# Patient Record
Sex: Female | Born: 1965 | Race: White | Hispanic: No | Smoking: Never smoker
Health system: Southern US, Community
[De-identification: ages and names within clinical notes are randomized; demographics above are authoritative.]

## PROBLEM LIST (undated history)

## (undated) DIAGNOSIS — T7840XA Allergy, unspecified, initial encounter: Secondary | ICD-10-CM

## (undated) DIAGNOSIS — K635 Polyp of colon: Secondary | ICD-10-CM

## (undated) DIAGNOSIS — R519 Headache, unspecified: Secondary | ICD-10-CM

## (undated) DIAGNOSIS — G8929 Other chronic pain: Secondary | ICD-10-CM

## (undated) DIAGNOSIS — N390 Urinary tract infection, site not specified: Secondary | ICD-10-CM

## (undated) DIAGNOSIS — K589 Irritable bowel syndrome without diarrhea: Secondary | ICD-10-CM

## (undated) DIAGNOSIS — R51 Headache: Secondary | ICD-10-CM

## (undated) DIAGNOSIS — M542 Cervicalgia: Secondary | ICD-10-CM

## (undated) HISTORY — DX: Allergy, unspecified, initial encounter: T78.40XA

## (undated) HISTORY — DX: Polyp of colon: K63.5

## (undated) HISTORY — DX: Urinary tract infection, site not specified: N39.0

## (undated) HISTORY — DX: Hereditary hemochromatosis: E83.110

## (undated) HISTORY — PX: LAPAROSCOPIC ENDOMETRIOSIS FULGURATION: SUR769

## (undated) HISTORY — PX: ABDOMINAL HYSTERECTOMY: SHX81

---

## 2003-03-25 ENCOUNTER — Encounter: Admission: RE | Admit: 2003-03-25 | Discharge: 2003-03-25 | Payer: Self-pay | Admitting: Family Medicine

## 2003-03-25 ENCOUNTER — Encounter: Payer: Self-pay | Admitting: Family Medicine

## 2006-10-19 ENCOUNTER — Ambulatory Visit: Payer: Self-pay | Admitting: Obstetrics and Gynecology

## 2007-09-03 ENCOUNTER — Ambulatory Visit: Payer: Self-pay | Admitting: Obstetrics and Gynecology

## 2007-09-09 ENCOUNTER — Ambulatory Visit: Payer: Self-pay | Admitting: Obstetrics and Gynecology

## 2008-12-04 DIAGNOSIS — K635 Polyp of colon: Secondary | ICD-10-CM

## 2008-12-04 HISTORY — DX: Polyp of colon: K63.5

## 2010-03-23 ENCOUNTER — Encounter: Payer: Self-pay | Admitting: Cardiology

## 2010-03-24 ENCOUNTER — Encounter: Admission: RE | Admit: 2010-03-24 | Discharge: 2010-03-24 | Payer: Self-pay | Admitting: Emergency Medicine

## 2010-04-05 ENCOUNTER — Encounter: Admission: RE | Admit: 2010-04-05 | Discharge: 2010-04-05 | Payer: Self-pay | Admitting: Emergency Medicine

## 2010-04-05 ENCOUNTER — Other Ambulatory Visit: Admission: RE | Admit: 2010-04-05 | Discharge: 2010-04-05 | Payer: Self-pay | Admitting: Radiology

## 2010-05-16 ENCOUNTER — Encounter: Payer: Self-pay | Admitting: Cardiology

## 2010-07-08 ENCOUNTER — Telehealth (INDEPENDENT_AMBULATORY_CARE_PROVIDER_SITE_OTHER): Payer: Self-pay | Admitting: *Deleted

## 2010-07-11 ENCOUNTER — Ambulatory Visit: Payer: Self-pay | Admitting: Cardiology

## 2010-07-11 DIAGNOSIS — R002 Palpitations: Secondary | ICD-10-CM | POA: Insufficient documentation

## 2010-07-11 DIAGNOSIS — R079 Chest pain, unspecified: Secondary | ICD-10-CM

## 2010-07-11 DIAGNOSIS — R0789 Other chest pain: Secondary | ICD-10-CM | POA: Insufficient documentation

## 2010-08-23 ENCOUNTER — Telehealth (INDEPENDENT_AMBULATORY_CARE_PROVIDER_SITE_OTHER): Payer: Self-pay | Admitting: *Deleted

## 2010-12-19 ENCOUNTER — Encounter
Admission: RE | Admit: 2010-12-19 | Discharge: 2010-12-19 | Payer: Self-pay | Source: Home / Self Care | Attending: Emergency Medicine | Admitting: Emergency Medicine

## 2011-01-03 NOTE — Letter (Signed)
Summary: [facility]  [facility]   Imported By: Marylou Mccoy 07/27/2010 12:20:10  _____________________________________________________________________  External Attachment:    Type:   Image     Comment:   External Document

## 2011-01-03 NOTE — Progress Notes (Signed)
Summary: 48 hour holter monitor Cx  Phone Note Outgoing Call Call back at [facility] Phone [phone]   Call placed by: Stanton Kidney, EMT-P,  August 23, 2010 3:01 PM Summary of Call: Left message for pt to call to reschedule for 48 hour holter monitor. 09/03/10 s/w Pt. advised she was not going to get the monitor and s/w Dr. Shirlee Latch about it already. Stanton Kidney, EMT-P  September 03, 2010 12:37 PM

## 2011-01-03 NOTE — Progress Notes (Signed)
  Recieved records from [place] office. Pt appt w/ Shirlee Latch 07/11/10 gave to Hosie Poisson Mesiemore  July 08, 2010 9:47 AM

## 2011-01-03 NOTE — Assessment & Plan Note (Signed)
Summary: NP6/MILD CHEST PAIN   Referring Provider:  Dr. Cleta Alberts Primary Provider:  Lesle Chris  CC:  new patient with mild chest pain.  Pt reports this has been intermittently for the last couple of years.  History of Present Illness: 45 yo with minimal past medical history presents for evaluation of chest pain and palpitations.  She has been having substernal sharp chest pain for the last 6 months.  It is sporadic and has no clear association with exercise or meals.  It usually occurs at work.  It can last for hours but tends to be mild in character.  She has been under a lot of stress at work over the last 6 months.  She works for [employer] and they have been undergoing the conversion to [employer].  She is able to climb 3 flights of steps to her office but is winded at the top.  She does not smoke.    Patient also reports occasional palpitations.  She will feel her heart race at times.  She tends to notice this when lying in bed at night.  This probably happens weekly.   ECG: NSR, normal  Labs (6/11): HCT 47.8, K 4.8, creatinine 0.5, LDL 81, HDL 60, TSH normal  Current Medications (verified): 1)  Tylenol 325 Mg Tabs (Acetaminophen) .... As Needed For Body Pain 2)  Excedrin Tension Headache 500-65 Mg Tabs (Acetaminophen-Caffeine) .... As Needed 3)  Multivitamins  Tabs (Multiple Vitamin) .... Once Daily  Allergies (verified): 1)  ! Pcn 2)  ! Prednisone 3)  ! * Valtrex  Past History:  Past Medical History: Endometriosis with partial hysterectomy  Family History: She was adopted.  Biological sister has history of PSVT.  Does not know her parents.  Social History: Environmental health practitioner at [employer].  Married, 1 adopted child.  No smoking.  No ETOH or drugs.   Review of Systems       All systems reviewed and negative except as per HPI.   Vital Signs:  Patient profile:   45 year old female Height:      61 inches Weight:      121 pounds BMI:     22.95 Pulse rate:   64 /  minute Pulse rhythm:   regular BP sitting:   108 / 72  (left arm) Cuff size:   regular  Vitals Entered By: Judithe Modest CMA (July 11, 2010 12:06 PM)  Physical Exam  General:  Well developed, well nourished, in no acute distress. Head:  normocephalic and atraumatic Nose:  no deformity, discharge, inflammation, or lesions Mouth:  Teeth, gums and palate normal. Oral mucosa normal. Neck:  Neck supple, no JVD. No masses, thyromegaly or abnormal cervical nodes. Lungs:  Clear bilaterally to auscultation and percussion. Heart:  Non-displaced PMI, chest non-tender; regular rate and rhythm, S1, S2 without murmurs, rubs or gallops. Carotid upstroke normal, no bruit. Pedals normal pulses. No edema, no varicosities. Abdomen:  Bowel sounds positive; abdomen soft and non-tender without masses, organomegaly, or hernias noted. No hepatosplenomegaly. Msk:  Back normal, normal gait. Muscle strength and tone normal. Extremities:  No clubbing or cyanosis. Neurologic:  Alert and oriented x 3. Skin:  Intact without lesions or rashes. Psych:  Normal affect.   Impression & Recommendations:  Problem # 1:  CHEST PAIN-UNSPECIFIED (ICD-786.50) Atypical chest pain.  Patient has no known risk factors for coronary disease.  She is very anxious that this could be heart disease.  I suspect that this may be a stress response  related to her work.  I will set her up for an ETT.  If that is normal, no further cardiac workup necessary.   Problem # 2:  PALPITATIONS (ICD-785.1) Occasional episodes where she feels her heart racing.  Her sister does have a history of SVT (no further details known).  If will have her get a 48 hour holter monitor to see if we catch an arrhythmia.   Other Orders: Holter (Holter) Treadmill (Treadmill)  Patient Instructions: 1)  Your physician has recommended that you wear a holter monitor.  Holter monitors are medical devices that record the heart's electrical activity. Doctors most often  use these monitors to diagnose arrhythmias. Arrhythmias are problems with the speed or rhythm of the heartbeat. The monitor is a small, portable device. You can wear one while you do your normal daily activities. This is usually used to diagnose what is causing palpitations/syncope (passing out). 48 Hour--HAVE THIS DONE BEFORE YOU HAVE THE TREADMILL 2)  Your physician has requested that you have an exercise tolerance test.  For further information please visit [email or web address]  Please also follow instruction sheet, as given.

## 2011-06-27 ENCOUNTER — Other Ambulatory Visit: Payer: Self-pay | Admitting: Emergency Medicine

## 2011-06-27 DIAGNOSIS — N6009 Solitary cyst of unspecified breast: Secondary | ICD-10-CM

## 2011-07-26 ENCOUNTER — Ambulatory Visit
Admission: RE | Admit: 2011-07-26 | Discharge: 2011-07-26 | Disposition: A | Discharge status: Home / Self Care | Payer: BC Managed Care – PPO | Source: Ambulatory Visit | Attending: Emergency Medicine | Admitting: Emergency Medicine

## 2011-07-26 DIAGNOSIS — N6009 Solitary cyst of unspecified breast: Secondary | ICD-10-CM

## 2011-11-20 ENCOUNTER — Ambulatory Visit: Payer: BC Managed Care – PPO

## 2011-11-20 NOTE — Progress Notes (Signed)
Patient seen for genetic counseling for hemochromatosis. 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 Will send summary letter to referring physician and Dr. Cleta Alberts per patient's request.

## 2011-12-22 ENCOUNTER — Telehealth: Payer: Self-pay | Admitting: Internal Medicine

## 2011-12-22 NOTE — Telephone Encounter (Signed)
alled pt with np appt for 1/23 at 1;00.

## 2011-12-25 ENCOUNTER — Telehealth: Payer: Self-pay | Admitting: Internal Medicine

## 2011-12-25 NOTE — Telephone Encounter (Signed)
Chart Del. °

## 2011-12-27 ENCOUNTER — Telehealth: Payer: Self-pay | Admitting: Internal Medicine

## 2011-12-27 ENCOUNTER — Ambulatory Visit (HOSPITAL_BASED_OUTPATIENT_CLINIC_OR_DEPARTMENT_OTHER): Payer: BC Managed Care – PPO | Admitting: Internal Medicine

## 2011-12-27 ENCOUNTER — Ambulatory Visit: Payer: BC Managed Care – PPO

## 2011-12-27 ENCOUNTER — Other Ambulatory Visit (HOSPITAL_BASED_OUTPATIENT_CLINIC_OR_DEPARTMENT_OTHER): Payer: BC Managed Care – PPO

## 2011-12-27 ENCOUNTER — Encounter: Payer: Self-pay | Admitting: Internal Medicine

## 2011-12-27 LAB — COMPREHENSIVE METABOLIC PANEL
AST: 20 U/L (ref 0–37)
Albumin: 4.6 g/dL (ref 3.5–5.2)
Alkaline Phosphatase: 43 U/L (ref 39–117)
Potassium: 4.1 mEq/L (ref 3.5–5.3)
Sodium: 141 mEq/L (ref 135–145)
Total Bilirubin: 0.4 mg/dL (ref 0.3–1.2)
Total Protein: 7.1 g/dL (ref 6.0–8.3)

## 2011-12-27 LAB — IRON AND TIBC
%SAT: 46 % (ref 20–55)
Iron: 126 ug/dL (ref 42–145)
TIBC: 273 ug/dL (ref 250–470)
UIBC: 147 ug/dL (ref 125–400)

## 2011-12-27 LAB — CBC WITH DIFFERENTIAL/PLATELET
Basophils Absolute: 0 10*3/uL (ref 0.0–0.1)
Eosinophils Absolute: 0.1 10*3/uL (ref 0.0–0.5)
HCT: 37.9 % (ref 34.8–46.6)
HGB: 12.9 g/dL (ref 11.6–15.9)
MCH: 31.8 pg (ref 25.1–34.0)
MONO#: 0.3 10*3/uL (ref 0.1–0.9)
NEUT#: 2.2 10*3/uL (ref 1.5–6.5)
NEUT%: 51.5 % (ref 38.4–76.8)
RDW: 12.1 % (ref 11.2–14.5)
WBC: 4.4 10*3/uL (ref 3.9–10.3)
lymph#: 1.7 10*3/uL (ref 0.9–3.3)

## 2011-12-27 NOTE — Progress Notes (Signed)
[facility] CONSULT NOTE  REASON FOR CONSULTATION:  46 years old white female diagnosed with  hereditary hemochromatosis.  HPI Ruth Harrison is a 46 y.o. female with no significant past medical history. Over the last few months, the patient has been complaining of increasing fatigue and weakness. She was initially sought to have iron deficiency anemia and the plan was to place her on iron supplement but the patient did not start her treatment because of lack of supporting lab. The patient continues to have the fatigue as well as the justification was consistent with irritable bowel disease including constipation alternating with diarrhea. She had iron study performed by her primary care physician and it showed elevated iron saturation suspicious for hemochromatosis. Unfortunately I don't have these results. 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 She was referred to me today for evaluation and recommendation regarding her condition. The patient is feeling okay today. She continues to have fatigue as well as migraine headache. She has generalized aching pain and the cough area right more than left as well as on the right hand now with the addition to the right arm. She is currently on that nabumetone for pain management by her primary care physician. The patient denied having any significant weight loss or night sweats, she has no history of diabetes mellitus, no cardiac disease, no neurologic abnormalities, she is adopted and family history is unknown. She is currently married with 3 stepsons and one adopted daughter. She has no history of smoking, alcohol or drug abuse and she does administrative work. She was seen by genetic counseling regarding her condition.  @SFHPI @  Past Medical History  Diagnosis Date  . Allergy   . Endometriosis     Past Surgical History  Procedure Date  . Abdominal hysterectomy partial      Family History  Problem Relation Age of Onset  . Adopted: Yes    Social History History  Substance Use Topics  . Smoking status: Former Smoker -- 1.0 packs/day for 2 years  . Smokeless tobacco: Never Used  . Alcohol Use: No    Allergies  Allergen Reactions  . Oxycodone Hives  . Penicillins Itching  . Prednisone Shortness Of Breath    Chest pain  . Valacyclovir Hcl Hives    Current Outpatient Prescriptions  Medication Sig Dispense Refill  . glucosamine-chondroitin 500-400 MG tablet Take 1 tablet by mouth daily.      . nabumetone (RELAFEN) 500 MG tablet Take 500 mg by mouth 2 (two) times daily.      Marland Kitchen aspirin-acetaminophen-caffeine (EXCEDRIN MIGRAINE) 250-250-65 MG per tablet Take 1 tablet by mouth every 6 (six) hours as needed.      . loratadine (CLARITIN) 10 MG tablet Take 10 mg by mouth daily.        Review of Systems  A comprehensive review of systems was negative except for: Constitutional: positive for fatigue Gastrointestinal: positive for constipation and diarrhea Musculoskeletal: positive for arthralgias and muscle weakness  Physical Exam  ZOX:WRUEA, healthy, no distress, well nourished and well developed SKIN: skin color, texture, turgor are normal HEAD: Normocephalic EYES: normal, PERRLA EARS: External ears normal, Canals clear OROPHARYNX:no exudate, no erythema and lips, buccal mucosa, and tongue normal  NECK: supple, no adenopathy LYMPH:  no palpable lymphadenopathy, no hepatosplenomegaly BREAST:not examined LUNGS: clear to auscultation  HEART: regular rate & rhythm, no murmurs and no gallops ABDOMEN:abdomen soft, non-tender, normal bowel sounds and no masses or organomegaly  BACK: Back symmetric, no curvature. EXTREMITIES:no joint deformities, effusion, or inflammation, no edema, no skin discoloration, no clubbing, no cyanosis  NEURO: alert & oriented x 3 with fluent speech, no focal motor/sensory deficits    Studies/Results: No results  found.   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 I have a lengthy discussion with the patient today about her condition and the goal of treatment which is mainly for prevention of complication of the hemochromatosis like liver cirrhosis, diabetes mellitus or cardiac abnormalities by reducing her iron overload.  PLAN: #1 I ordered several studies today to evaluate her condition including repeat CBC, comprehensive metabolic panel, iron study and ferritin. #2 I will arrange for the patient phlebotomy and the frequency of it will depend on the ferritin level with a goal to keep her ferritin of less than 50 mg/dl.  #3 advice the patient to avoid the iron rich foods and also any supplements containing iron.  #4 her lab results still pending, I would cause admission was result and will  arrange followup visit based on these results. I gave the patient the time to ask questions and I answered them completely to her satisfaction. The patient agreed to the current plan.   All questions were answered. The patient knows to call the clinic with any problems, questions or concerns. We can certainly see the patient much sooner if necessary.  Thank you so much for allowing me to participate in the care of Ruth Harrison. I will continue to follow up the patient with you and assist in her care.  I spent 25 minutes counseling the patient face to face. The total time spent in the appointment was 55 minutes.   Tayvion Lauder K. 12/27/2011, 5:10 PM

## 2011-12-27 NOTE — Telephone Encounter (Signed)
Dx- Hemochromatosis

## 2011-12-28 ENCOUNTER — Other Ambulatory Visit: Payer: Self-pay | Admitting: Internal Medicine

## 2011-12-28 ENCOUNTER — Telehealth: Payer: Self-pay | Admitting: Internal Medicine

## 2011-12-28 NOTE — Progress Notes (Signed)
Quick Note:  Call patient with the result with the ferritin results. She needs weekly phlebotomy for the next 3-4 weeks to keep ferritin level less than 50. I will see her back for F/U visit in 4 weeks with repeat Iron study and Ferritin. ______

## 2011-12-28 NOTE — Telephone Encounter (Signed)
Message copied by Charma Igo on Thu Dec 28, 2011  9:23 AM ------      Message from: [facility], [place]      Created: Thu Dec 28, 2011  8:23 AM       Call patient with the result with the ferritin results. She needs weekly phlebotomy for the next 3-4 weeks to keep ferritin level less than 50. I will see her back for F/U visit in 4 weeks with repeat Iron study and Ferritin.

## 2011-12-29 ENCOUNTER — Other Ambulatory Visit: Payer: Self-pay | Admitting: Internal Medicine

## 2011-12-29 ENCOUNTER — Telehealth: Payer: Self-pay | Admitting: Internal Medicine

## 2011-12-29 NOTE — Telephone Encounter (Signed)
called pt with appts for 2/6 2/13 2/20 and lab md and phleb on 2/27   aom

## 2012-01-03 ENCOUNTER — Other Ambulatory Visit: Payer: Self-pay | Admitting: *Deleted

## 2012-01-04 ENCOUNTER — Other Ambulatory Visit: Payer: Self-pay | Admitting: Family Medicine

## 2012-01-04 ENCOUNTER — Other Ambulatory Visit: Payer: Self-pay | Admitting: *Deleted

## 2012-01-04 NOTE — Telephone Encounter (Signed)
WANTS TO WHY REFILL FOR NABUMETONE WAS DENIED

## 2012-01-05 NOTE — Telephone Encounter (Signed)
Was erx'd on 01/02/12. Please check on this.  Alcide Memoli

## 2012-01-10 ENCOUNTER — Other Ambulatory Visit: Payer: Self-pay | Admitting: Internal Medicine

## 2012-01-10 ENCOUNTER — Ambulatory Visit (HOSPITAL_BASED_OUTPATIENT_CLINIC_OR_DEPARTMENT_OTHER): Payer: BC Managed Care – PPO

## 2012-01-10 ENCOUNTER — Other Ambulatory Visit: Payer: Self-pay | Admitting: *Deleted

## 2012-01-13 NOTE — Telephone Encounter (Signed)
It was not denied. It has already been refilled. pls verify that this has been done.  Ishani Goldwasser

## 2012-01-13 NOTE — Telephone Encounter (Signed)
Addended by: Sondra Barges on: 01/13/2012 06:57 PM   Modules accepted: Orders

## 2012-01-17 ENCOUNTER — Ambulatory Visit (HOSPITAL_BASED_OUTPATIENT_CLINIC_OR_DEPARTMENT_OTHER): Payer: BC Managed Care – PPO

## 2012-01-17 ENCOUNTER — Other Ambulatory Visit: Payer: Self-pay | Admitting: Internal Medicine

## 2012-01-17 NOTE — Progress Notes (Signed)
Phlebotomy done right anticubital space with 22g x 1" angiocath. obtained. Patient tolerated well. vss.

## 2012-01-24 ENCOUNTER — Ambulatory Visit (HOSPITAL_BASED_OUTPATIENT_CLINIC_OR_DEPARTMENT_OTHER): Payer: BC Managed Care – PPO

## 2012-01-24 NOTE — Progress Notes (Signed)
1535 to 1555;   Phlebotomy performed to Left AC using 20g cath attached to secondary tubing w/ extension tubing drained to empty 500cc IV bag.  Pt requested the smaller needle.  500 g blood drained w/o difficulty over 15 minutes.  Pt tolerated procedure well.  States felt a "little woozy" after the procedure.  Drank a sprite and ate tomato soup w/ saltines.  VSS after 30 minutes. Pt got up to use rest room and stated she felt fine and ok to drive home.  Instructed to drink plenty of fluids over next 24 hrs and call for any questions/concerns.  Pt verbalized understanding.

## 2012-01-25 ENCOUNTER — Other Ambulatory Visit: Payer: Self-pay | Admitting: *Deleted

## 2012-01-25 NOTE — Progress Notes (Signed)
Pt called stating that she is still feeling woozy and lightheaded the day after her most recent phlebotomy.  Per Dr Donnald Garre, continue to force fluids, will recheck lab work on 2/26 before f/u appt on 2/27.  Pt verbalized understanding.  SLJ

## 2012-01-29 ENCOUNTER — Ambulatory Visit (HOSPITAL_BASED_OUTPATIENT_CLINIC_OR_DEPARTMENT_OTHER): Payer: BC Managed Care – PPO | Admitting: Lab

## 2012-01-29 ENCOUNTER — Other Ambulatory Visit: Payer: Self-pay | Admitting: Internal Medicine

## 2012-01-29 ENCOUNTER — Telehealth: Payer: Self-pay | Admitting: Internal Medicine

## 2012-01-29 LAB — CBC WITH DIFFERENTIAL/PLATELET
BASO%: 0.2 % (ref 0.0–2.0)
EOS%: 1.3 % (ref 0.0–7.0)
HCT: 28.4 % — ABNORMAL LOW (ref 34.8–46.6)
HGB: 9.4 g/dL — ABNORMAL LOW (ref 11.6–15.9)
MCH: 32.4 pg (ref 25.1–34.0)
MCHC: 33.1 g/dL (ref 31.5–36.0)
MONO#: 0.3 10*3/uL (ref 0.1–0.9)
NEUT%: 60.2 % (ref 38.4–76.8)
RDW: 14.3 % (ref 11.2–14.5)
WBC: 4.5 10*3/uL (ref 3.9–10.3)
lymph#: 1.4 10*3/uL (ref 0.9–3.3)

## 2012-01-29 LAB — COMPREHENSIVE METABOLIC PANEL
ALT: 16 U/L (ref 0–35)
AST: 19 U/L (ref 0–37)
Albumin: 4 g/dL (ref 3.5–5.2)
Alkaline Phosphatase: 40 U/L (ref 39–117)
Calcium: 8.7 mg/dL (ref 8.4–10.5)
Chloride: 107 mEq/L (ref 96–112)
Potassium: 4.1 mEq/L (ref 3.5–5.3)
Sodium: 140 mEq/L (ref 135–145)
Total Protein: 6 g/dL (ref 6.0–8.3)

## 2012-01-29 LAB — IRON AND TIBC
%SAT: 44 % (ref 20–55)
TIBC: 252 ug/dL (ref 250–470)

## 2012-01-29 LAB — FERRITIN: Ferritin: 38 ng/mL (ref 10–291)

## 2012-01-29 NOTE — Telephone Encounter (Addendum)
Requests to come in today for lab instead of tomorrow due to scheduling conflict. I will send scheduling request

## 2012-01-30 ENCOUNTER — Other Ambulatory Visit: Payer: BC Managed Care – PPO | Admitting: Lab

## 2012-01-31 ENCOUNTER — Other Ambulatory Visit: Payer: BC Managed Care – PPO | Admitting: Lab

## 2012-01-31 ENCOUNTER — Ambulatory Visit (HOSPITAL_BASED_OUTPATIENT_CLINIC_OR_DEPARTMENT_OTHER): Payer: BC Managed Care – PPO | Admitting: Internal Medicine

## 2012-01-31 ENCOUNTER — Ambulatory Visit: Payer: BC Managed Care – PPO | Admitting: Internal Medicine

## 2012-01-31 NOTE — Progress Notes (Signed)
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  PRIOR THERAPY: Weekly phlebotomies x3, last one was on 01/24/2012  CURRENT THERAPY: None  INTERVAL HISTORY: Ruth Harrison 46 y.o. female returns to the clinic today for followup visit. The patient underwent a weekly phlebotomies x3. She has increasing fatigue and weakness secondary to the anemia from frequent phlebotomy. She starts feeling better in the last 2 days. She denied having any significant dizzy spells. She has no bleeding issues. The patient has repeat CBC, iron study and ferritin performed recently and she is here today for evaluation and discussion of her lab results.  MEDICAL HISTORY: Past Medical History  Diagnosis Date  . Allergy   . Endometriosis     ALLERGIES:  is allergic to oxycodone; penicillins; prednisone; and valacyclovir hcl.  MEDICATIONS:  Current Outpatient Prescriptions  Medication Sig Dispense Refill  . ibuprofen (ADVIL,MOTRIN) 200 MG tablet Take 200 mg by mouth 2 (two) times daily.      Marland Kitchen aspirin-acetaminophen-caffeine (EXCEDRIN MIGRAINE) 250-250-65 MG per tablet Take 1 tablet by mouth every 6 (six) hours as needed.      Marland Kitchen glucosamine-chondroitin 500-400 MG tablet Take 1 tablet by mouth daily.      Marland Kitchen loratadine (CLARITIN) 10 MG tablet Take 10 mg by mouth daily.      . nabumetone (RELAFEN) 500 MG tablet Take 500 mg by mouth 2 (two) times daily.        SURGICAL HISTORY:  Past Surgical History  Procedure Date  . Abdominal hysterectomy partial     REVIEW OF SYSTEMS:  A comprehensive review of systems was negative except for: Constitutional: positive for fatigue   PHYSICAL EXAMINATION: General appearance: alert, cooperative and no distress Neck: no adenopathy Resp: clear to auscultation bilaterally Cardio:  regular rate and rhythm, S1, S2 normal, no murmur, click, rub or gallop GI: soft, non-tender; bowel sounds normal; no masses,  no organomegaly Extremities: extremities normal, atraumatic, no cyanosis or edema  ECOG PERFORMANCE STATUS: 1 - Symptomatic but completely ambulatory  Blood pressure 100/66, pulse 72, temperature 98.1 F (36.7 C), temperature source Oral, height 5\' 1"  (1.549 m), weight 125 lb 1.6 oz (56.745 kg).  LABORATORY DATA: Lab Results  Component Value Date   WBC 4.5 01/29/2012   HGB 9.4* 01/29/2012   HCT 28.4* 01/29/2012   MCV 97.9 01/29/2012   PLT 216 01/29/2012      Chemistry      Component Value Date/Time   NA 140 01/29/2012 1359   K 4.1 01/29/2012 1359   CL 107 01/29/2012 1359   CO2 27 01/29/2012 1359   BUN 16 01/29/2012 1359   CREATININE 0.56 01/29/2012 1359      Component Value Date/Time   CALCIUM 8.7 01/29/2012 1359   ALKPHOS 40 01/29/2012 1359   AST 19 01/29/2012 1359   ALT 16 01/29/2012 1359   BILITOT 0.2* 01/29/2012 1359       RADIOGRAPHIC STUDIES: Ferritin 38, serum iron 110, total iron binding capacity 252, iron saturation 44%.  ASSESSMENT: This is a very pleasant 46 years old white female with hereditary hemochromatosis is status post phlebotomy x3 weekly. The patient has significant improvement in her ferritin level.  PLAN: I recommended for her continuous observation for now with repeat CBC, comprehensive metabolic panel, iron study and ferritin and 2 months. She was advised to call  me immediately if she has any concerning symptoms in the interval.  All questions were answered. The patient knows to call the clinic with any problems, questions or concerns. We can certainly see the patient much sooner if necessary.

## 2012-03-01 ENCOUNTER — Ambulatory Visit (INDEPENDENT_AMBULATORY_CARE_PROVIDER_SITE_OTHER): Payer: BC Managed Care – PPO | Admitting: Internal Medicine

## 2012-03-01 VITALS — BP 112/70 | HR 76 | Temp 97.7°F | Resp 18 | Ht 62.0 in | Wt 124.6 lb

## 2012-03-01 DIAGNOSIS — L02419 Cutaneous abscess of limb, unspecified: Secondary | ICD-10-CM

## 2012-03-01 DIAGNOSIS — L03119 Cellulitis of unspecified part of limb: Secondary | ICD-10-CM

## 2012-03-01 DIAGNOSIS — B373 Candidiasis of vulva and vagina: Secondary | ICD-10-CM

## 2012-03-01 MED ORDER — FLUCONAZOLE 150 MG PO TABS: ORAL_TABLET | ORAL | Status: DC

## 2012-03-01 NOTE — Progress Notes (Signed)
  Subjective:    Patient ID: Ruth Harrison, female    DOB: 12-20-1965, 46 y.o.   MRN: [id number]  HPIFinished Z-Pak 3 days ago Now has vaginal irritation and itching with thick white discharge Past history of yeast vaginitis after antibiotics  Also has a cyst tender lump in the right groin that was red 3 weeks ago-wax wane Better now perhaps because of Zithromax /    Review of Systems     Objective:   Physical ExamVital signs stable Resolving Cellulitis  right groin        Assessment & Plan:  Problem #1 yeast vaginitis secondary to antibiotics Problem #2 resolving cellulitis right groin  and hot compresses to groin Diflucan 150 now and in 7 days

## 2012-04-01 ENCOUNTER — Other Ambulatory Visit (HOSPITAL_BASED_OUTPATIENT_CLINIC_OR_DEPARTMENT_OTHER): Payer: BC Managed Care – PPO | Admitting: Lab

## 2012-04-01 LAB — CBC WITH DIFFERENTIAL/PLATELET
BASO%: 0.3 % (ref 0.0–2.0)
EOS%: 1.1 % (ref 0.0–7.0)
Eosinophils Absolute: 0.1 10*3/uL (ref 0.0–0.5)
LYMPH%: 39.7 % (ref 14.0–49.7)
MCH: 32.7 pg (ref 25.1–34.0)
MCHC: 33.4 g/dL (ref 31.5–36.0)
MCV: 97.9 fL (ref 79.5–101.0)
MONO%: 8.7 % (ref 0.0–14.0)
Platelets: 179 10*3/uL (ref 145–400)
RBC: 4.1 10*6/uL (ref 3.70–5.45)
RDW: 11.7 % (ref 11.2–14.5)
nRBC: 0 % (ref 0–0)

## 2012-04-01 LAB — IRON AND TIBC: TIBC: 267 ug/dL (ref 250–470)

## 2012-04-01 LAB — COMPREHENSIVE METABOLIC PANEL
AST: 19 U/L (ref 0–37)
Albumin: 3.9 g/dL (ref 3.5–5.2)
BUN: 13 mg/dL (ref 6–23)
CO2: 29 mEq/L (ref 19–32)
Calcium: 8.6 mg/dL (ref 8.4–10.5)
Chloride: 106 mEq/L (ref 96–112)
Potassium: 4 mEq/L (ref 3.5–5.3)

## 2012-04-04 ENCOUNTER — Ambulatory Visit (HOSPITAL_BASED_OUTPATIENT_CLINIC_OR_DEPARTMENT_OTHER): Payer: BC Managed Care – PPO | Admitting: Internal Medicine

## 2012-04-04 ENCOUNTER — Telehealth: Payer: Self-pay | Admitting: Internal Medicine

## 2012-04-04 NOTE — Telephone Encounter (Signed)
appts made and printed for pt aom °

## 2012-04-04 NOTE — Progress Notes (Signed)
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   PRIOR THERAPY: Weekly phlebotomies x3, last one was on 01/24/2012   CURRENT THERAPY: None   INTERVAL HISTORY: Ruth Harrison 46 y.o. female returns to the clinic today for 2  Months followup visit. The patient has no complaints today except for arthralgia. She denied having any significant fatigue or weakness. She has no weight loss or night sweats. She has repeat CBC and iron study performed recently and she is here today for evaluation and discussion of her lab results.  MEDICAL HISTORY: Past Medical History  Diagnosis Date  . Allergy   . Endometriosis     ALLERGIES:  is allergic to oxycodone; penicillins; prednisone; valacyclovir hcl; and sulfa antibiotics.  MEDICATIONS:  Current Outpatient Prescriptions  Medication Sig Dispense Refill  . aspirin-acetaminophen-caffeine (EXCEDRIN MIGRAINE) 250-250-65 MG per tablet Take 1 tablet by mouth every 6 (six) hours as needed.      Marland Kitchen BIOTIN PO Take 1 tablet by mouth daily. Pt unsure of dose      . fexofenadine (ALLEGRA) 30 MG tablet Take 30 mg by mouth daily.      Marland Kitchen ibuprofen (ADVIL,MOTRIN) 200 MG tablet Take 200 mg by mouth 2 (two) times daily.      Marland Kitchen glucosamine-chondroitin 500-400 MG tablet Take 1 tablet by mouth daily.      Marland Kitchen loratadine (CLARITIN) 10 MG tablet Take 10 mg by mouth daily.      . nabumetone (RELAFEN) 500 MG tablet Take 500 mg by mouth 2 (two) times daily.        SURGICAL HISTORY:  Past Surgical History  Procedure Date  . Abdominal hysterectomy partial     REVIEW OF SYSTEMS:  A comprehensive review of systems was negative except for: Musculoskeletal: positive for arthralgias   PHYSICAL EXAMINATION: General appearance: alert, cooperative and no distress Head: Normocephalic,  without obvious abnormality, atraumatic Lymph nodes: Cervical, supraclavicular, and axillary nodes normal. Resp: clear to auscultation bilaterally Cardio: regular rate and rhythm, S1, S2 normal, no murmur, click, rub or gallop GI: soft, non-tender; bowel sounds normal; no masses,  no organomegaly Extremities: extremities normal, atraumatic, no cyanosis or edema  ECOG PERFORMANCE STATUS: 1 - Symptomatic but completely ambulatory  Blood pressure 105/66, pulse 60, temperature 97.2 F (36.2 C), temperature source Oral, height 5\' 2"  (1.575 m), weight 124 lb 6.4 oz (56.427 kg).  LABORATORY DATA: Lab Results  Component Value Date   WBC 4.9 04/01/2012   HGB 13.4 04/01/2012   HCT 40.2 04/01/2012   MCV 97.9 04/01/2012   PLT 179 04/01/2012      Chemistry      Component Value Date/Time   NA 141 04/01/2012 1551   K 4.0 04/01/2012 1551   CL 106 04/01/2012 1551   CO2 29 04/01/2012 1551   BUN 13 04/01/2012 1551   CREATININE 0.70 04/01/2012 1551      Component Value Date/Time   CALCIUM 8.6 04/01/2012 1551   ALKPHOS 47 04/01/2012 1551   AST 19 04/01/2012 1551   ALT 15 04/01/2012 1551   BILITOT 0.2* 04/01/2012 1551       RADIOGRAPHIC STUDIES: No results found.  ASSESSMENT: This is a very pleasant 46 years old white female with hereditary hemochromatosis status post phlebotomy x3 completed on 01/24/2012  PLAN: The patient is doing fine and her lab  today is unremarkable. I discussed the lab result with the patient and recommended for her continuous observation for now. I would see her back for followup visit in 3 months with repeat CBC, iron study and ferritin. She was advised to call me immediately if she has any concerning symptoms in the interval.  All questions were answered. The patient knows to call the clinic with any problems, questions or concerns. We can certainly see the patient much sooner if necessary.

## 2012-05-13 ENCOUNTER — Telehealth: Payer: Self-pay

## 2012-05-13 NOTE — Telephone Encounter (Signed)
[facility] office requesting labs on pt. Not seen here in epic w/labs. Seen last in [facility] for labs. Pt has appt this week. Chart not filed upstairs to send labs.   Fax: [phone]  bf

## 2012-05-13 NOTE — Telephone Encounter (Signed)
2012 labs faxed to Dr. Kenna Gilbert office with confirmation.

## 2012-05-14 ENCOUNTER — Encounter: Payer: Self-pay | Admitting: Physician Assistant

## 2012-05-14 ENCOUNTER — Ambulatory Visit (INDEPENDENT_AMBULATORY_CARE_PROVIDER_SITE_OTHER): Payer: BC Managed Care – PPO | Admitting: Physician Assistant

## 2012-05-14 VITALS — BP 98/68 | HR 74 | Temp 97.9°F | Resp 18 | Ht 62.0 in | Wt 123.8 lb

## 2012-05-14 DIAGNOSIS — M2550 Pain in unspecified joint: Secondary | ICD-10-CM

## 2012-05-14 DIAGNOSIS — M25529 Pain in unspecified elbow: Secondary | ICD-10-CM

## 2012-05-14 NOTE — Progress Notes (Deleted)
  Subjective:    Patient ID: Ruth Harrison, female    DOB: 12-31-1965, 46 y.o.   MRN: [id number]  HPI    Review of Systems  Constitutional: Positive for chills, diaphoresis and fatigue.  HENT: Positive for sneezing, neck stiffness and sinus pressure.   Eyes: Positive for itching.  Respiratory: Negative.   Cardiovascular: Negative.   Gastrointestinal: Positive for nausea, diarrhea and constipation.  Genitourinary: Negative.   Musculoskeletal: Positive for arthralgias and gait problem.  Skin: Negative.   Neurological: Positive for headaches.  Hematological: Negative.   Psychiatric/Behavioral: Positive for sleep disturbance and decreased concentration. The patient is nervous/anxious.        Objective:   Physical Exam        Assessment & Plan:

## 2012-05-14 NOTE — Progress Notes (Signed)
  Subjective:    Patient ID: Ruth Harrison, female    DOB: Nov 11, 1966, 46 y.o.   MRN: [id number]  HPI Ms. Mello comes in today concerned about arthralgias.  She was diagnosed in 10/2011 with hemachromatosis and has been evaluated and treated regularly with phlebotomy by Dr. Tanja Port.  Since then, she notes that her right wrist and fingers have been very achey.  She was seen here and given wrist splints that help a little bit but not much.  She then noted that her feet and ankles were very sore when she would stand after sitting for some time.  She has always had some type of lower extremity pain and achiness that she can recall since childhood but this is different.  She denies any swelling, erythema, warmth, febrile illnesses since the onset.  She has a sedentary job and uses the computer often.  She is under a great deal of stress with every day life stressors but is feeling slightly overwhelmed.  She also notes that she has had some hot flashes at night and having a hard time focusing and concentrating recently.  She requests that I fill out a biometric screening form for work from her 9/12 CPE.   Review of Systems  Constitutional: Negative.   Respiratory: Negative.   Cardiovascular: Negative.   Gastrointestinal: Negative.   Genitourinary: Negative.   Musculoskeletal: Positive for arthralgias.  Neurological: Negative.   Psychiatric/Behavioral: Negative.        Objective:   Physical Exam  Constitutional: She is oriented to person, place, and time. She appears well-developed and well-nourished.  Cardiovascular: Normal rate and regular rhythm.   Pulmonary/Chest: Effort normal and breath sounds normal.  Musculoskeletal: Normal range of motion. She exhibits no edema and no tenderness.  Neurological: She is alert and oriented to person, place, and time.          Assessment & Plan:  Arthralgias Hemachromatosis    Check ANA, RF, Sed Rate, Vitamin D Schedule CPE 9/13

## 2012-05-15 LAB — VITAMIN D 25 HYDROXY (VIT D DEFICIENCY, FRACTURES): Vit D, 25-Hydroxy: 24 ng/mL — ABNORMAL LOW (ref 30–89)

## 2012-05-21 ENCOUNTER — Telehealth: Payer: Self-pay

## 2012-05-21 DIAGNOSIS — M79643 Pain in unspecified hand: Secondary | ICD-10-CM

## 2012-05-21 NOTE — Telephone Encounter (Signed)
PT WOULD LIKE FOR ALICIA TO GIVE HER A CALL WHEN SHE GETS IN. STATES SHE JUST WANT TO DISCUSS SOME THINGS WITH HER PLEASE CALL [phone]

## 2012-05-21 NOTE — Telephone Encounter (Signed)
Helmut Muster, Do you want [place] to get details for you?

## 2012-05-22 NOTE — Telephone Encounter (Signed)
If she is willing, please get details

## 2012-05-22 NOTE — Telephone Encounter (Signed)
Patient received a call about lab work.  Unsure about amount of vitamin D she should be taking.  Also would like to know what she should do since arthritis test came back negative.  She is having extreme pain in her hand.

## 2012-05-24 MED ORDER — ERGOCALCIFEROL 1.25 MG (50000 UT) PO CAPS: 50000.0000 [IU] | ORAL_CAPSULE | ORAL | Status: DC

## 2012-05-24 NOTE — Telephone Encounter (Signed)
RX SENT IN TO PHARMACY

## 2012-05-24 NOTE — Telephone Encounter (Signed)
LMOM to CB. Please verify pharm w/pt so that vit D Rx can be sent in and ask if pt wants referral to ortho.

## 2012-05-24 NOTE — Telephone Encounter (Signed)
I wrote the med in the lab note.  Vitamin D 50,000 1 every week for 12 weeks #12.  Then decreased to OTC dose of Vitamin D 1000 qd.  I can refer her to ortho if she'd like

## 2012-05-24 NOTE — Telephone Encounter (Signed)
[facility] [address] [facility] [place] YES, PROCEED WITH REFERRAL TO ORTHO

## 2012-06-05 ENCOUNTER — Encounter: Payer: Self-pay | Admitting: *Deleted

## 2012-06-05 NOTE — Progress Notes (Signed)
labwork from [facility] given to Dr Donnald Garre to review.  SLJ

## 2012-06-07 ENCOUNTER — Other Ambulatory Visit (HOSPITAL_BASED_OUTPATIENT_CLINIC_OR_DEPARTMENT_OTHER): Payer: BC Managed Care – PPO | Admitting: Lab

## 2012-06-07 LAB — CBC WITH DIFFERENTIAL/PLATELET
Basophils Absolute: 0 10*3/uL (ref 0.0–0.1)
Eosinophils Absolute: 0 10*3/uL (ref 0.0–0.5)
HCT: 39.9 % (ref 34.8–46.6)
HGB: 13.6 g/dL (ref 11.6–15.9)
LYMPH%: 29.7 % (ref 14.0–49.7)
MCV: 93.2 fL (ref 79.5–101.0)
MONO#: 0.4 10*3/uL (ref 0.1–0.9)
MONO%: 8 % (ref 0.0–14.0)
NEUT#: 3.4 10*3/uL (ref 1.5–6.5)
NEUT%: 61.7 % (ref 38.4–76.8)
Platelets: 178 10*3/uL (ref 145–400)
WBC: 5.4 10*3/uL (ref 3.9–10.3)

## 2012-06-07 LAB — IRON AND TIBC
%SAT: 75 % — ABNORMAL HIGH (ref 20–55)
Iron: 212 ug/dL — ABNORMAL HIGH (ref 42–145)

## 2012-06-07 LAB — COMPREHENSIVE METABOLIC PANEL
ALT: 12 U/L (ref 0–35)
AST: 19 U/L (ref 0–37)
Calcium: 9.4 mg/dL (ref 8.4–10.5)
Chloride: 103 mEq/L (ref 96–112)
Creatinine, Ser: 0.49 mg/dL — ABNORMAL LOW (ref 0.50–1.10)
Potassium: 4 mEq/L (ref 3.5–5.3)
Sodium: 139 mEq/L (ref 135–145)
Total Protein: 7.6 g/dL (ref 6.0–8.3)

## 2012-06-07 LAB — FERRITIN: Ferritin: 46 ng/mL (ref 10–291)

## 2012-06-11 ENCOUNTER — Ambulatory Visit (HOSPITAL_BASED_OUTPATIENT_CLINIC_OR_DEPARTMENT_OTHER): Payer: BC Managed Care – PPO | Admitting: Internal Medicine

## 2012-06-11 ENCOUNTER — Telehealth: Payer: Self-pay | Admitting: Internal Medicine

## 2012-06-11 NOTE — Progress Notes (Signed)
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   PRIOR THERAPY: Weekly phlebotomies x3, last one was on 01/24/2012   CURRENT THERAPY: None  INTERVAL HISTORY: Ruth Harrison 46 y.o. female returns to the clinic today for two-month followup visit. She is feeling fine today except for arthralgia and occasional headache. She denied having any significant chest pain or shortness of breath, cough or hemoptysis. She has no weight loss or night sweats. The patient has repeat CBC and iron study performed recently and she is here for evaluation and discussion of her lab results. Her studies showed iron given 212, total iron binding capacity 282, iron saturation 75%, ferritin level 46.  MEDICAL HISTORY: Past Medical History  Diagnosis Date  . Allergy   . Endometriosis     ALLERGIES:  is allergic to oxycodone; penicillins; prednisone; valacyclovir hcl; and sulfa antibiotics.  MEDICATIONS:  Current Outpatient Prescriptions  Medication Sig Dispense Refill  . ergocalciferol (VITAMIN D2) 50000 UNITS capsule Take 1 capsule (50,000 Units total) by mouth once a week.  12 capsule  0  . Probiotic Product (PROBIOTIC DAILY PO) Take by mouth.      Marland Kitchen aspirin-acetaminophen-caffeine (EXCEDRIN MIGRAINE) 250-250-65 MG per tablet Take 1 tablet by mouth every 6 (six) hours as needed.      Marland Kitchen BIOTIN PO Take 1 tablet by mouth daily. Pt unsure of dose      . fexofenadine (ALLEGRA) 30 MG tablet Take 30 mg by mouth daily.      Marland Kitchen glucosamine-chondroitin 500-400 MG tablet Take 1 tablet by mouth daily.      Marland Kitchen ibuprofen (ADVIL,MOTRIN) 200 MG tablet Take 200 mg by mouth 2 (two) times daily.      Marland Kitchen loratadine (CLARITIN) 10 MG tablet Take 10 mg by mouth daily.      . nabumetone (RELAFEN) 500 MG tablet Take 500 mg by mouth 2 (two) times  daily.        SURGICAL HISTORY:  Past Surgical History  Procedure Date  . Abdominal hysterectomy partial     REVIEW OF SYSTEMS:  A comprehensive review of systems was negative except for: Constitutional: positive for fatigue Musculoskeletal: positive for arthralgias   PHYSICAL EXAMINATION: General appearance: alert, cooperative and no distress Head: Normocephalic, without obvious abnormality, atraumatic Neck: no adenopathy Lymph nodes: Cervical, supraclavicular, and axillary nodes normal. Resp: clear to auscultation bilaterally Cardio: regular rate and rhythm, S1, S2 normal, no murmur, click, rub or gallop GI: soft, non-tender; bowel sounds normal; no masses,  no organomegaly Extremities: extremities normal, atraumatic, no cyanosis or edema Neurologic: Alert and oriented X 3, normal strength and tone. Normal symmetric reflexes. Normal coordination and gait  ECOG PERFORMANCE STATUS: 1 - Symptomatic but completely ambulatory  Blood pressure 103/69, pulse 71, temperature 97 F (36.1 C), temperature source Oral, weight 125 lb 3.2 oz (56.79 kg).  LABORATORY DATA: Lab Results  Component Value Date   WBC 5.4 06/07/2012   HGB 13.6 06/07/2012   HCT 39.9 06/07/2012   MCV 93.2 06/07/2012   PLT 178 06/07/2012      Chemistry      Component Value Date/Time   NA 139 06/07/2012 1621   K 4.0 06/07/2012 1621   CL 103 06/07/2012 1621   CO2 28 06/07/2012 1621   BUN 12 06/07/2012 1621   CREATININE 0.49* 06/07/2012 1621  Component Value Date/Time   CALCIUM 9.4 06/07/2012 1621   ALKPHOS 45 06/07/2012 1621   AST 19 06/07/2012 1621   ALT 12 06/07/2012 1621   BILITOT 0.4 06/07/2012 1621       RADIOGRAPHIC STUDIES: No results found.  ASSESSMENT: This is a very pleasant 46 years old white female with hereditary hemochromatosis status post phlebotomy and has been observation in the last 2 months  PLAN: I discussed the lab result with the patient. Her ferritin level is still less than 50. I would continue her on  observation for the next 2 months with repeat CBC, comprehensive metabolic panel, ferritin and iron study. I may consider her for repeat phlebotomy of her ferritin level over 50. The patient was advised to call me immediately if she has any concerning symptoms in the interval.  All questions were answered. The patient knows to call the clinic with any problems, questions or concerns. We can certainly see the patient much sooner if necessary.

## 2012-06-11 NOTE — Telephone Encounter (Signed)
Gave pt appt for September 2013 lab and MD 

## 2012-07-23 ENCOUNTER — Encounter: Payer: Self-pay | Admitting: Physician Assistant

## 2012-07-23 DIAGNOSIS — K5900 Constipation, unspecified: Secondary | ICD-10-CM

## 2012-08-09 ENCOUNTER — Other Ambulatory Visit: Payer: BC Managed Care – PPO | Admitting: Lab

## 2012-08-11 ENCOUNTER — Other Ambulatory Visit: Payer: Self-pay | Admitting: Physician Assistant

## 2012-08-15 ENCOUNTER — Ambulatory Visit (HOSPITAL_BASED_OUTPATIENT_CLINIC_OR_DEPARTMENT_OTHER): Payer: BC Managed Care – PPO | Admitting: Lab

## 2012-08-15 LAB — CBC WITH DIFFERENTIAL/PLATELET
BASO%: 0.5 % (ref 0.0–2.0)
HCT: 41.1 % (ref 34.8–46.6)
MCHC: 33.9 g/dL (ref 31.5–36.0)
MONO#: 0.4 10*3/uL (ref 0.1–0.9)
RBC: 4.25 10*6/uL (ref 3.70–5.45)
WBC: 4.9 10*3/uL (ref 3.9–10.3)
lymph#: 2 10*3/uL (ref 0.9–3.3)

## 2012-08-15 LAB — COMPREHENSIVE METABOLIC PANEL (CC13)
AST: 16 U/L (ref 5–34)
Alkaline Phosphatase: 50 U/L (ref 40–150)
BUN: 15 mg/dL (ref 7.0–26.0)
Calcium: 9.2 mg/dL (ref 8.4–10.4)
Creatinine: 0.7 mg/dL (ref 0.6–1.1)
Total Bilirubin: 0.4 mg/dL (ref 0.20–1.20)

## 2012-08-15 LAB — IRON AND TIBC
%SAT: 45 % (ref 20–55)
Iron: 126 ug/dL (ref 42–145)
TIBC: 277 ug/dL (ref 250–470)

## 2012-08-16 ENCOUNTER — Other Ambulatory Visit: Payer: BC Managed Care – PPO | Admitting: Lab

## 2012-08-20 ENCOUNTER — Ambulatory Visit (HOSPITAL_BASED_OUTPATIENT_CLINIC_OR_DEPARTMENT_OTHER): Payer: BC Managed Care – PPO | Admitting: Internal Medicine

## 2012-08-20 NOTE — Progress Notes (Signed)
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   PRIOR THERAPY: Weekly phlebotomies x3, last one was on 01/24/2012   CURRENT THERAPY: None  INTERVAL HISTORY: Ruth Harrison 46 y.o. female returns to the clinic today for routine two-month followup visit. The patient is feeling fine today with no specific complaints except for arthralgias. She denied having any significant fatigue weakness. The patient has no chest pain, shortness breath, cough or hemoptysis. She denied having any significant weight loss or night sweats. She has repeat CBC, comprehensive metabolic panel and iron study performed recently and she is here for evaluation and discussion of her lab results.  MEDICAL HISTORY: Past Medical History  Diagnosis Date  . Allergy   . Endometriosis     ALLERGIES:  is allergic to oxycodone; penicillins; prednisone; valacyclovir hcl; and sulfa antibiotics.  MEDICATIONS:  Current Outpatient Prescriptions  Medication Sig Dispense Refill  . Vitamin D, Ergocalciferol, (DRISDOL) 50000 UNITS CAPS take 1 capsule by mouth every week  12 capsule  0  . aspirin-acetaminophen-caffeine (EXCEDRIN MIGRAINE) 250-250-65 MG per tablet Take 1 tablet by mouth every 6 (six) hours as needed.      . fexofenadine (ALLEGRA) 30 MG tablet Take 30 mg by mouth daily.      Marland Kitchen ibuprofen (ADVIL,MOTRIN) 200 MG tablet Take 200 mg by mouth 2 (two) times daily.      Marland Kitchen loratadine (CLARITIN) 10 MG tablet Take 10 mg by mouth daily.      . Probiotic Product (PROBIOTIC DAILY PO) Take by mouth.        SURGICAL HISTORY:  Past Surgical History  Procedure Date  . Abdominal hysterectomy partial     REVIEW OF SYSTEMS:  A comprehensive review of systems was negative except for: Musculoskeletal: positive for arthralgias   PHYSICAL  EXAMINATION: General appearance: alert, cooperative and no distress Head: Normocephalic, without obvious abnormality, atraumatic Neck: no adenopathy Lymph nodes: Cervical, supraclavicular, and axillary nodes normal. Resp: clear to auscultation bilaterally Cardio: regular rate and rhythm, S1, S2 normal, no murmur, click, rub or gallop GI: soft, non-tender; bowel sounds normal; no masses,  no organomegaly Extremities: extremities normal, atraumatic, no cyanosis or edema  ECOG PERFORMANCE STATUS: 1 - Symptomatic but completely ambulatory  There were no vitals taken for this visit.  LABORATORY DATA: Lab Results  Component Value Date   WBC 4.9 08/15/2012   HGB 13.9 08/15/2012   HCT 41.1 08/15/2012   MCV 96.6 08/15/2012   PLT 178 08/15/2012      Chemistry      Component Value Date/Time   NA 139 08/15/2012 1600   NA 139 06/07/2012 1621   K 4.2 08/15/2012 1600   K 4.0 06/07/2012 1621   CL 103 08/15/2012 1600   CL 103 06/07/2012 1621   CO2 27 08/15/2012 1600   CO2 28 06/07/2012 1621   BUN 15.0 08/15/2012 1600   BUN 12 06/07/2012 1621   CREATININE 0.7 08/15/2012 1600   CREATININE 0.49* 06/07/2012 1621      Component Value Date/Time   CALCIUM 9.2 08/15/2012 1600   CALCIUM 9.4 06/07/2012 1621   ALKPHOS 50 08/15/2012 1600   ALKPHOS 45 06/07/2012 1621   AST 16 08/15/2012 1600   AST 19 06/07/2012 1621   ALT 11 08/15/2012 1600   ALT 12 06/07/2012 1621   BILITOT 0.40 08/15/2012 1600  BILITOT 0.4 06/07/2012 1621       RADIOGRAPHIC STUDIES: No results found.  ASSESSMENT: This is a very pleasant 46 years old white female with hereditary hemochromatosis with homozygous C282Y mutation. The patient is doing fine today with no significant increase in her iron study as well as hemoglobin and hematocrit. Her ferritin level is still less than 50  PLAN: I recommended for her to continue on observation for now. I would see her back for followup visit in 3 months with repeat CBC, comprehensive metabolic panel, iron study  and ferritin. She was advised to stay on low iron diet. She was also advised to call me immediately if she has any concerning symptoms in the interval.  All questions were answered. The patient knows to call the clinic with any problems, questions or concerns. We can certainly see the patient much sooner if necessary.

## 2012-08-20 NOTE — Patient Instructions (Signed)
Your blood work is normal today. Followup in 3 months with repeat CBC and iron study

## 2012-08-26 ENCOUNTER — Telehealth: Payer: Self-pay | Admitting: Internal Medicine

## 2012-08-26 NOTE — Telephone Encounter (Signed)
s.w. pt and she needed appt to be after the first of the year due to insurance and money problems....changed schedule to reflect what the pt needed in Jan....sed

## 2012-09-17 ENCOUNTER — Encounter: Payer: Self-pay | Admitting: Family Medicine

## 2012-11-05 ENCOUNTER — Other Ambulatory Visit: Payer: Self-pay | Admitting: Physician Assistant

## 2012-11-20 ENCOUNTER — Other Ambulatory Visit: Payer: BC Managed Care – PPO | Admitting: Lab

## 2012-11-25 ENCOUNTER — Ambulatory Visit: Payer: BC Managed Care – PPO | Admitting: Internal Medicine

## 2012-12-02 ENCOUNTER — Ambulatory Visit (INDEPENDENT_AMBULATORY_CARE_PROVIDER_SITE_OTHER): Payer: BC Managed Care – PPO | Admitting: Family Medicine

## 2012-12-02 VITALS — BP 101/67 | HR 65 | Temp 97.8°F | Resp 16 | Ht 62.0 in | Wt 122.0 lb

## 2012-12-02 DIAGNOSIS — L255 Unspecified contact dermatitis due to plants, except food: Secondary | ICD-10-CM

## 2012-12-02 MED ORDER — TRIAMCINOLONE ACETONIDE 0.1 % EX CREA: TOPICAL_CREAM | Freq: Two times a day (BID) | CUTANEOUS | Status: DC

## 2012-12-02 NOTE — Progress Notes (Signed)
[facility] [address], [place] [address] [phone]  Date:  12/02/2012   Name:  Ruth Harrison   DOB:  1966/08/19   MRN:  [id number]  PCP:  Mariel Craft    Chief Complaint: Rash   History of Present Illness:  Ruth Harrison is a 46 y.o. very pleasant female patient who presents with the following:  She is concerned about exposure to poison ivy. She did some yard work yesterday and noted onset of a tiny area of rash on her right wrist.  This am has a typical PI rash on her wrists, right leg and abdomen. She has not yet tried any medications.  She would like something that "will work quickly" to stop the spread of her rash, but does not want to take prednisone unless it is absolutely necessary.    Otherwise she feels ok, no SOB or other symptoms.    She has a history of heritary hemachromatosis. She is followed by oncology and will see them next in January. She has phlebotomy periodically to manage this condition.    She has used oral prednisone in the past, but she states that she had blurred vision, racing heart beat and other strong side effects (not actually an allergic reaction but an intolerance).  She is able to use topical steroids such as cortisone cream.    Declined to let me check a CBC today as they are watching their money closely until the new year.    Patient Active Problem List  Diagnosis  . PALPITATIONS  . CHEST PAIN-UNSPECIFIED  . Hemochromatosis  . Constipation    Past Medical History  Diagnosis Date  . Allergy   . Endometriosis     Past Surgical History  Procedure Date  . Abdominal hysterectomy partial     History  Substance Use Topics  . Smoking status: Former Smoker -- 1.0 packs/day for 2 years  . Smokeless tobacco: Never Used  . Alcohol Use: No    Family History  Problem Relation Age of Onset  . Adopted: Yes    Allergies  Allergen Reactions  . Oxycodone Hives  . Penicillins Itching  . Prednisone  Shortness Of Breath    Chest pain  . Valacyclovir Hcl Hives  . Sulfa Antibiotics Hives    Medication list has been reviewed and updated.  Current Outpatient Prescriptions on File Prior to Visit  Medication Sig Dispense Refill  . ibuprofen (ADVIL,MOTRIN) 200 MG tablet Take 200 mg by mouth 2 (two) times daily.      . Probiotic Product (PROBIOTIC DAILY PO) Take by mouth.      . Vitamin D, Ergocalciferol, (DRISDOL) 50000 UNITS CAPS take 1 capsule by mouth every week  12 capsule  0  . aspirin-acetaminophen-caffeine (EXCEDRIN MIGRAINE) 250-250-65 MG per tablet Take 1 tablet by mouth every 6 (six) hours as needed.      . fexofenadine (ALLEGRA) 30 MG tablet Take 30 mg by mouth daily.      Marland Kitchen loratadine (CLARITIN) 10 MG tablet Take 10 mg by mouth daily.        Review of Systems:  As per HPI- otherwise negative.   Physical Examination: Filed Vitals:   12/02/12 0848  BP: 101/67  Pulse: 65  Temp: 97.8 F (36.6 C)  Resp: 16   Filed Vitals:   12/02/12 0848  Height: 5\' 2"  (1.575 m)  Weight: 122 lb (55.339 kg)   Body mass index is 22.31 kg/(m^2). Ideal Body Weight:  Weight in (lb) to have BMI = 25: 136.4   GEN: WDWN, NAD, Non-toxic, A & O x 3 HEENT: Atraumatic, Normocephalic. Neck supple. No masses, No LAD. Bilateral TM wnl, oropharynx normal.  PEERL,EOMI.   No sign of angioedema.  Ears and Nose: No external deformity. CV: RRR, No M/G/R. No JVD. No thrill. No extra heart sounds. PULM: CTA B, no wheezes, crackles, rhonchi. No retractions. No resp. distress. No accessory muscle use. EXTR: No c/c/e.  She has a mild rhus dermatitis type rash on the dorsal side of both wrist. No other visible rash, she did not wish to undress to show me her abdomen or leg NEURO Normal gait.  PSYCH: Normally interactive. Conversant. Not depressed or anxious appearing.  Calm demeanor.    Assessment and Plan: 1. Rhus dermatitis  triamcinolone cream (KENALOG) 0.1 %   Minimal rhus dermatitis.  Emilianna would  like a medication to halt the spread of this rash.  I explained that corticosteroids are generally used for this purpose, but she does not want to use them unless absolutely necessary.  Will try symptomatic treatment with topical steroids, antihistamines.  If not better or if getting worse she can call me if she does desire to use steroids  Kaeleen Odom, MD

## 2012-12-02 NOTE — Patient Instructions (Addendum)
You can treat the rash topically with benadryl cream and with steroid creams (I sent in an rx for triamcinolone, but you could also try OTC cortisone cream)  At night benadryl will help with itching.  During the day you can take claritin or zyrtec which are less sedating.  Zantac is also a histamine blocker and can be helpful.  If you decided that you do need prednisone because your symptoms are severe please give me a call and I can send an rx to your pharmacy

## 2012-12-04 ENCOUNTER — Other Ambulatory Visit: Payer: Self-pay

## 2012-12-04 DIAGNOSIS — L255 Unspecified contact dermatitis due to plants, except food: Secondary | ICD-10-CM

## 2012-12-04 NOTE — Telephone Encounter (Signed)
Dr Patsy Lager, your 12/02/12 OV notes state for pt to CB if she needs a steroid as discussed at OV. Do you want to Rx something?

## 2012-12-04 NOTE — Telephone Encounter (Signed)
Called and spoke with her.  The PI has spread over her legs and she is very itchy.  She thinks that she may need to use prednisone to help her through this illness.  However, when she took prednisone many years ago she noted significant SI such as racing heartbeat.  She did not have any severe effects such as itching, swelling or hives.    Explained to her that these effects may well occur again, and encouraged her to try and use other methods to deal with her itching such as topical calomine and benadryl.  She states that she cannot tolerate benadryl either.  Decided on a plan to try a low dose of prednisone to see if she could tolerate it.  Will use the 10 mg tablets and have her try 1 or 2.  If she can tolerate this but still has itching can try going to 40 mg a day.  If she can only tolerate 20 mg a day that is fine too.  She will take the higher dose for 3 or 4 days, then halve it for 3 or 4 days.

## 2012-12-04 NOTE — Telephone Encounter (Signed)
Pt called and stated needs prescription for prednisone. [facility] [phone] best # to call back

## 2012-12-05 ENCOUNTER — Telehealth: Payer: Self-pay | Admitting: Family Medicine

## 2012-12-05 MED ORDER — METHYLPREDNISOLONE 4 MG PO KIT: PACK | ORAL | Status: DC

## 2012-12-05 NOTE — Telephone Encounter (Signed)
Called the [facility] main pharmacy and also an allergist with [place] for advice- both suggested that we try a medrol dosepack.  Did call this in to her pharmacy, but left a detailed message cautioning her that she could have the same SE that she had in the past and I would use this medication only if she felt it was absolutely necessary.  Also, I would try taking just part of the first days dosage to see how she tolerated it prior to taking the full dosage.

## 2012-12-05 NOTE — Telephone Encounter (Signed)
Was able to reach her.  She thinks she has taken a medrol dosepack in the past and did ok.  Again cautioned her to proceed very carefully and seek help if she has any strong adverse effects.

## 2012-12-05 NOTE — Telephone Encounter (Signed)
Per Dr. Patsy Lager called patient to let her know she is working on Prednisone rx for [employer]. Prednisone is listed as allergy in chart. So she is going to talk with pharmacist. She will call patient and let her know. Left message for patient.

## 2012-12-06 NOTE — Telephone Encounter (Signed)
Pt CB and reported that she was able to get her prednisone and it seems to be helping a little bit already.

## 2012-12-06 NOTE — Telephone Encounter (Signed)
I LMOM for pt to check to see if she was able to get her prednisone that Dr Patsy Lager was speaking to the pharmacist about. I asked her to CB if she is still having a problem w/getting her prednisone Rx.

## 2012-12-12 ENCOUNTER — Other Ambulatory Visit: Payer: Self-pay | Admitting: Medical Oncology

## 2012-12-13 ENCOUNTER — Other Ambulatory Visit (HOSPITAL_BASED_OUTPATIENT_CLINIC_OR_DEPARTMENT_OTHER): Payer: BC Managed Care – PPO

## 2012-12-13 ENCOUNTER — Other Ambulatory Visit: Payer: Self-pay | Admitting: Medical Oncology

## 2012-12-13 LAB — CBC WITH DIFFERENTIAL/PLATELET
Basophils Absolute: 0 10*3/uL (ref 0.0–0.1)
Eosinophils Absolute: 0.1 10*3/uL (ref 0.0–0.5)
HGB: 14.6 g/dL (ref 11.6–15.9)
MCV: 94.2 fL (ref 79.5–101.0)
MONO%: 9.1 % (ref 0.0–14.0)
NEUT#: 2.9 10*3/uL (ref 1.5–6.5)
RDW: 12.8 % (ref 11.2–14.5)

## 2012-12-13 LAB — IRON AND TIBC
TIBC: 267 ug/dL (ref 250–470)
UIBC: 96 ug/dL — ABNORMAL LOW (ref 125–400)

## 2012-12-13 LAB — FERRITIN: Ferritin: 57 ng/mL (ref 10–291)

## 2012-12-17 ENCOUNTER — Encounter: Payer: Self-pay | Admitting: Internal Medicine

## 2012-12-17 ENCOUNTER — Telehealth: Payer: Self-pay | Admitting: *Deleted

## 2012-12-17 ENCOUNTER — Telehealth: Payer: Self-pay | Admitting: Internal Medicine

## 2012-12-17 ENCOUNTER — Ambulatory Visit (HOSPITAL_BASED_OUTPATIENT_CLINIC_OR_DEPARTMENT_OTHER): Payer: BC Managed Care – PPO

## 2012-12-17 ENCOUNTER — Ambulatory Visit (HOSPITAL_BASED_OUTPATIENT_CLINIC_OR_DEPARTMENT_OTHER): Payer: BC Managed Care – PPO | Admitting: Internal Medicine

## 2012-12-17 NOTE — Progress Notes (Signed)
Therapeutic phlebotomy done, total obtained. Had to stick pt twice, clotted after with first stick. Pt tolerated well. Ate crackers and drank water post phlebotomy

## 2012-12-17 NOTE — Telephone Encounter (Signed)
Talked to patient she is aware of her appt on 12/27/12 and the rest of her appts

## 2012-12-17 NOTE — Telephone Encounter (Signed)
Scheduled patient for lab and MD 3 months from today , emailed Michelle regarding phlebotomy for 12/27/12

## 2012-12-17 NOTE — Patient Instructions (Signed)

## 2012-12-17 NOTE — Patient Instructions (Signed)
Your serum iron and iron saturation as well as ferritin are elevated today. We'll proceed with phlebotomy today and on January 24. Followup in 3 months with repeat CBC and iron study.

## 2012-12-17 NOTE — Telephone Encounter (Signed)
Per staff message and POF I have scheduled appts.  JMW  

## 2012-12-17 NOTE — Progress Notes (Signed)
[facility] Telephone:(336) [phone]   Fax:([phone]  OFFICE PROGRESS NOTE  [name], PA-C [address] [place] [address]  DIAGNOSIS: Hereditary hemochromatosis with homozygous C282Y mutation.   PRIOR THERAPY: Weekly phlebotomies x3, last one was on 01/24/2012   CURRENT THERAPY: None  INTERVAL HISTORY: Ruth Harrison 47 y.o. female returns to the clinic today for routine three-month followup visit. The patient is feeling fine today with no specific complaints except for arthralgia as well as fatigue. She denied having any significant weight loss or night sweats. She denied having any chest pain, shortness breath, cough or hemoptysis. The patient has repeat CBC on a study performed recently and she is here today for evaluation and discussion of her lab results.  MEDICAL HISTORY: Past Medical History  Diagnosis Date  . Allergy   . Endometriosis     ALLERGIES:  is allergic to oxycodone; penicillins; prednisone; valacyclovir hcl; and sulfa antibiotics.  MEDICATIONS:  Current Outpatient Prescriptions  Medication Sig Dispense Refill  . aspirin-acetaminophen-caffeine (EXCEDRIN MIGRAINE) 250-250-65 MG per tablet Take 1 tablet by mouth every 6 (six) hours as needed.      . fexofenadine (ALLEGRA) 30 MG tablet Take 30 mg by mouth daily.      Marland Kitchen ibuprofen (ADVIL,MOTRIN) 200 MG tablet Take 200 mg by mouth 2 (two) times daily.      Marland Kitchen loratadine (CLARITIN) 10 MG tablet Take 10 mg by mouth daily.      Gracelyn Nurse Leaf Extract 150 MG CAPS Take 1 tablet by mouth daily.      . Probiotic Product (PROBIOTIC DAILY PO) Take by mouth.      . Quercetin 250 MG TABS Take 500 mg by mouth daily.      Marland Kitchen UNABLE TO FIND Take by mouth as needed. Ibuactin Extra Stength      . Vitamin D, Ergocalciferol, (DRISDOL) 50000 UNITS CAPS take 1 capsule by mouth every week  12 capsule  0    SURGICAL HISTORY:  Past Surgical History  Procedure Date  . Abdominal hysterectomy partial      REVIEW OF SYSTEMS:  A comprehensive review of systems was negative except for: Constitutional: positive for fatigue Musculoskeletal: positive for arthralgias   PHYSICAL EXAMINATION: General appearance: alert, cooperative, fatigued and no distress Head: Normocephalic, without obvious abnormality, atraumatic Neck: no adenopathy Lymph nodes: Cervical, supraclavicular, and axillary nodes normal. Resp: clear to auscultation bilaterally Cardio: regular rate and rhythm, S1, S2 normal, no murmur, click, rub or gallop GI: soft, non-tender; bowel sounds normal; no masses,  no organomegaly Extremities: extremities normal, atraumatic, no cyanosis or edema  ECOG PERFORMANCE STATUS: 1 - Symptomatic but completely ambulatory  There were no vitals taken for this visit.  LABORATORY DATA: Lab Results  Component Value Date   WBC 5.9 12/13/2012   HGB 14.6 12/13/2012   HCT 42.2 12/13/2012   MCV 94.2 12/13/2012   PLT 219 12/13/2012      Chemistry      Component Value Date/Time   NA 139 08/15/2012 1600   NA 139 06/07/2012 1621   K 4.2 08/15/2012 1600   K 4.0 06/07/2012 1621   CL 103 08/15/2012 1600   CL 103 06/07/2012 1621   CO2 27 08/15/2012 1600   CO2 28 06/07/2012 1621   BUN 15.0 08/15/2012 1600   BUN 12 06/07/2012 1621   CREATININE 0.7 08/15/2012 1600   CREATININE 0.49* 06/07/2012 1621      Component Value Date/Time   CALCIUM  9.2 08/15/2012 1600   CALCIUM 9.4 06/07/2012 1621   ALKPHOS 50 08/15/2012 1600   ALKPHOS 45 06/07/2012 1621   AST 16 08/15/2012 1600   AST 19 06/07/2012 1621   ALT 11 08/15/2012 1600   ALT 12 06/07/2012 1621   BILITOT 0.40 08/15/2012 1600   BILITOT 0.4 06/07/2012 1621       RADIOGRAPHIC STUDIES: No results found.  ASSESSMENT: This is a very pleasant 47 years old white female with hemochromatosis status post phlebotomy in the past last one was on 01/24/2012. The patient has been doing fine since that time. Her iron study today showed elevation of her serum iron level as well as iron  saturation in addition to higher ferritin level above 50.   PLAN: I discussed the lab result with the patient. I recommended for her to proceed with phlebotomy x2 to keep her ferritin level less than 50. She would come back for followup visit in 3 months with repeat CBC, ferritin and iron study. The patient was advised to call immediately she has any concerning symptoms in the interval.  All questions were answered. The patient knows to call the clinic with any problems, questions or concerns. We can certainly see the patient much sooner if necessary.

## 2012-12-26 ENCOUNTER — Telehealth: Payer: Self-pay | Admitting: *Deleted

## 2012-12-26 NOTE — Telephone Encounter (Signed)
Per patient reqeust I have moved her appt to later ion the day.  JMW

## 2012-12-27 ENCOUNTER — Telehealth: Payer: Self-pay | Admitting: *Deleted

## 2012-12-27 NOTE — Telephone Encounter (Signed)
Per patient voicemail, I have canceled appt for today. The patient is sick, and wants appt next week. I have called her back and left message to call me back to rescheduled.  JMW

## 2012-12-30 ENCOUNTER — Telehealth: Payer: Self-pay | Admitting: *Deleted

## 2012-12-30 NOTE — Telephone Encounter (Signed)
Patient called and rescheduled her missed appt from last week to tomorrow.  JMW

## 2012-12-31 ENCOUNTER — Telehealth: Payer: Self-pay | Admitting: *Deleted

## 2012-12-31 NOTE — Telephone Encounter (Signed)
Per staff message I have called the patient to reschedule her appt from this afternoon. I have left her a message to call me back to reschedule.  JMW

## 2012-12-31 NOTE — Telephone Encounter (Signed)
Call sent to triage--patient calling in to cancel phlebotomy for this afternoon due to weather. Informed patient we would call her back to have this rescheduled.

## 2013-01-25 ENCOUNTER — Other Ambulatory Visit: Payer: Self-pay | Admitting: Physician Assistant

## 2013-02-01 ENCOUNTER — Other Ambulatory Visit: Payer: Self-pay | Admitting: Physician Assistant

## 2013-03-14 ENCOUNTER — Other Ambulatory Visit (HOSPITAL_BASED_OUTPATIENT_CLINIC_OR_DEPARTMENT_OTHER): Payer: BC Managed Care – PPO | Admitting: Lab

## 2013-03-14 LAB — CBC WITH DIFFERENTIAL/PLATELET
BASO%: 0.5 % (ref 0.0–2.0)
Eosinophils Absolute: 0.1 10*3/uL (ref 0.0–0.5)
HCT: 38.5 % (ref 34.8–46.6)
LYMPH%: 39.3 % (ref 14.0–49.7)
MCHC: 33.5 g/dL (ref 31.5–36.0)
MONO#: 0.5 10*3/uL (ref 0.1–0.9)
NEUT#: 2.5 10*3/uL (ref 1.5–6.5)
NEUT%: 49.3 % (ref 38.4–76.8)
Platelets: 188 10*3/uL (ref 145–400)
RBC: 4.1 10*6/uL (ref 3.70–5.45)
WBC: 5.1 10*3/uL (ref 3.9–10.3)
lymph#: 2 10*3/uL (ref 0.9–3.3)

## 2013-03-14 LAB — IRON AND TIBC
%SAT: 56 % — ABNORMAL HIGH (ref 20–55)
TIBC: 259 ug/dL (ref 250–470)

## 2013-03-14 LAB — FERRITIN: Ferritin: 23 ng/mL (ref 10–291)

## 2013-03-17 ENCOUNTER — Telehealth: Payer: Self-pay | Admitting: Internal Medicine

## 2013-03-17 ENCOUNTER — Ambulatory Visit (HOSPITAL_BASED_OUTPATIENT_CLINIC_OR_DEPARTMENT_OTHER): Payer: BC Managed Care – PPO | Admitting: Internal Medicine

## 2013-03-17 ENCOUNTER — Encounter: Payer: Self-pay | Admitting: Internal Medicine

## 2013-03-17 DIAGNOSIS — R5383 Other fatigue: Secondary | ICD-10-CM

## 2013-03-17 DIAGNOSIS — R5381 Other malaise: Secondary | ICD-10-CM

## 2013-03-17 NOTE — Progress Notes (Signed)
[facility] Telephone:(336) [phone]   Fax:([phone]  OFFICE PROGRESS NOTE  [name], PA-C [address] [place] [address]  DIAGNOSIS: Hereditary hemochromatosis with homozygous C282Y mutation.   PRIOR THERAPY: Weekly phlebotomies x3, last one was in January of 2014.  CURRENT THERAPY: None  INTERVAL HISTORY: Ruth Harrison 47 y.o. female returns to the clinic today for routine three-month followup visit. The patient is feeling fine today with no specific complaints except for mild fatigue. She denied having any significant weight loss or night sweats. She has no chest pain, shortness of breath, cough or hemoptysis. She denied having any bleeding issues. The patient had repeat iron study and ferritin performed recently and she is here for evaluation and discussion of her lab results  MEDICAL HISTORY: Past Medical History  Diagnosis Date  . Allergy   . Endometriosis     ALLERGIES:  is allergic to oxycodone; penicillins; prednisone; valacyclovir hcl; and sulfa antibiotics.  MEDICATIONS:  Current Outpatient Prescriptions  Medication Sig Dispense Refill  . aspirin-acetaminophen-caffeine (EXCEDRIN MIGRAINE) 250-250-65 MG per tablet Take 1 tablet by mouth every 6 (six) hours as needed.      . fexofenadine (ALLEGRA) 30 MG tablet Take 30 mg by mouth daily.      Marland Kitchen ibuprofen (ADVIL,MOTRIN) 200 MG tablet Take 200 mg by mouth 2 (two) times daily.      Marland Kitchen loratadine (CLARITIN) 10 MG tablet Take 10 mg by mouth daily.      Gracelyn Nurse Leaf Extract 150 MG CAPS Take 1 tablet by mouth daily.      . Probiotic Product (PROBIOTIC DAILY PO) Take by mouth.      . Quercetin 250 MG TABS Take 500 mg by mouth daily.      Marland Kitchen UNABLE TO FIND Take by mouth as needed. Ibuactin Extra Stength      . Vitamin D, Ergocalciferol, (DRISDOL) 50000 UNITS CAPS take 1 capsule by mouth every week  12 capsule  0   No current facility-administered medications for this visit.    SURGICAL  HISTORY:  Past Surgical History  Procedure Laterality Date  . Abdominal hysterectomy  partial     REVIEW OF SYSTEMS:  A comprehensive review of systems was negative except for: Constitutional: positive for fatigue. ROS PHYSICAL EXAMINATION: General appearance: alert, cooperative and no distress Head: Normocephalic, without obvious abnormality, atraumatic Neck: no adenopathy Lymph nodes: Cervical, supraclavicular, and axillary nodes normal. Resp: clear to auscultation bilaterally Cardio: regular rate and rhythm, S1, S2 normal, no murmur, click, rub or gallop GI: soft, non-tender; bowel sounds normal; no masses,  no organomegaly Extremities: extremities normal, atraumatic, no cyanosis or edema  ECOG PERFORMANCE STATUS: 1 - Symptomatic but completely ambulatory  Blood pressure 104/62, pulse 71, temperature 97.9 F (36.6 C), temperature source Oral, resp. rate 18, height 5\' 2"  (1.575 m), weight 118 lb 11.2 oz (53.842 kg).  LABORATORY DATA: Lab Results  Component Value Date   WBC 5.1 03/14/2013   HGB 12.9 03/14/2013   HCT 38.5 03/14/2013   MCV 93.8 03/14/2013   PLT 188 03/14/2013      Chemistry      Component Value Date/Time   NA 139 08/15/2012 1600   NA 139 06/07/2012 1621   K 4.2 08/15/2012 1600   K 4.0 06/07/2012 1621   CL 103 08/15/2012 1600   CL 103 06/07/2012 1621   CO2 27 08/15/2012 1600   CO2 28 06/07/2012 1621   BUN 15.0 08/15/2012 1600  BUN 12 06/07/2012 1621   CREATININE 0.7 08/15/2012 1600   CREATININE 0.49* 06/07/2012 1621      Component Value Date/Time   CALCIUM 9.2 08/15/2012 1600   CALCIUM 9.4 06/07/2012 1621   ALKPHOS 50 08/15/2012 1600   ALKPHOS 45 06/07/2012 1621   AST 16 08/15/2012 1600   AST 19 06/07/2012 1621   ALT 11 08/15/2012 1600   ALT 12 06/07/2012 1621   BILITOT 0.40 08/15/2012 1600   BILITOT 0.4 06/07/2012 1621       RADIOGRAPHIC STUDIES: No results found.  ASSESSMENT: This is a very pleasant 47 years old white female with a history of hereditary hemochromatosis  status post phlebotomy last one was in January of 2014. The patient is doing fine today in her iron study and ferritin are within the acceptable range.   PLAN: I recommended for her to continue on observation for now with repeat CBC, iron study and ferritin in 3 months. For the fatigue, I would also TSH to evaluate for hypothyroidism. She was advised to call immediately she has any concerning symptoms in the interval  All questions were answered. The patient knows to call the clinic with any problems, questions or concerns. We can certainly see the patient much sooner if necessary.

## 2013-03-17 NOTE — Patient Instructions (Addendum)
Your iron studies is good today Follow up visit in 3 months with repeat iron study

## 2013-03-17 NOTE — Telephone Encounter (Signed)
gv and printed appt sched and avs to pt. °

## 2013-05-06 ENCOUNTER — Other Ambulatory Visit: Payer: Self-pay | Admitting: Physician Assistant

## 2013-06-02 ENCOUNTER — Other Ambulatory Visit: Payer: Self-pay | Admitting: Physician Assistant

## 2013-06-16 ENCOUNTER — Other Ambulatory Visit (HOSPITAL_BASED_OUTPATIENT_CLINIC_OR_DEPARTMENT_OTHER): Payer: BC Managed Care – PPO | Admitting: Lab

## 2013-06-16 DIAGNOSIS — R5381 Other malaise: Secondary | ICD-10-CM

## 2013-06-16 DIAGNOSIS — R5383 Other fatigue: Secondary | ICD-10-CM

## 2013-06-16 LAB — CBC WITH DIFFERENTIAL/PLATELET
EOS%: 1.8 % (ref 0.0–7.0)
Eosinophils Absolute: 0.1 10*3/uL (ref 0.0–0.5)
MCV: 94.8 fL (ref 79.5–101.0)
MONO%: 8.9 % (ref 0.0–14.0)
NEUT#: 2.2 10*3/uL (ref 1.5–6.5)
RBC: 4.15 10*6/uL (ref 3.70–5.45)
RDW: 13.2 % (ref 11.2–14.5)

## 2013-06-17 LAB — IRON AND TIBC CHCC
%SAT: 55 % (ref 21–57)
Iron: 133 ug/dL (ref 41–142)

## 2013-06-17 LAB — FERRITIN CHCC: Ferritin: 26 ng/ml (ref 9–269)

## 2013-06-18 ENCOUNTER — Encounter: Payer: Self-pay | Admitting: Internal Medicine

## 2013-06-18 ENCOUNTER — Ambulatory Visit (HOSPITAL_BASED_OUTPATIENT_CLINIC_OR_DEPARTMENT_OTHER): Payer: BC Managed Care – PPO | Admitting: Internal Medicine

## 2013-06-18 ENCOUNTER — Telehealth: Payer: Self-pay | Admitting: Internal Medicine

## 2013-06-18 NOTE — Patient Instructions (Signed)
Continue on observation for now.  Followup visit in 3 months with repeat CBC and iron study.

## 2013-06-18 NOTE — Telephone Encounter (Signed)
gv and printed appt sched and avs for pt  °

## 2013-06-18 NOTE — Progress Notes (Signed)
[facility] [phone]) [phone]   Fax:([phone]  OFFICE PROGRESS NOTE  [name], PA-C [address] [place] [address]  DIAGNOSIS: Hereditary hemochromatosis with homozygous C282Y mutation.   PRIOR THERAPY: Weekly phlebotomies x3, last one was in January of 2014.   CURRENT THERAPY: None  INTERVAL HISTORY: Ruth Harrison 47 y.o. female returns to the clinic today for three-month followup visit. The patient is feeling fine today with no specific complaints except for arthralgias and neck exams. The patient denied having any significant weight loss or night sweats. She denied having any chest pain, shortness breath, cough or hemoptysis. She had repeat CBC and iron study performed recently and she is here for evaluation and discussion of her lab results.  MEDICAL HISTORY: Past Medical History  Diagnosis Date  . Allergy   . Endometriosis     ALLERGIES:  is allergic to oxycodone; penicillins; prednisone; valacyclovir hcl; and sulfa antibiotics.  MEDICATIONS:  Current Outpatient Prescriptions  Medication Sig Dispense Refill  . fexofenadine (ALLEGRA) 30 MG tablet Take 30 mg by mouth daily.      Marland Kitchen glucosamine-chondroitin 500-400 MG tablet Take 1 tablet by mouth daily.      . Probiotic Product (PROBIOTIC DAILY PO) Take by mouth.      Marland Kitchen UNABLE TO FIND Take by mouth as needed. Ibuactin Extra Stength      . Vitamin D, Ergocalciferol, (DRISDOL) 50000 UNITS CAPS Take 1 capsule (50,000 Units total) by mouth every 7 (seven) days. PATIENT NEEDS OFFICE VISIT FOR ADDITIONAL REFILLS - 2nd NOTICE  2 capsule  0   No current facility-administered medications for this visit.    SURGICAL HISTORY:  Past Surgical History  Procedure Laterality Date  . Abdominal hysterectomy  partial     REVIEW OF SYSTEMS:  A comprehensive review of systems was negative except for: Musculoskeletal: positive for arthralgias   PHYSICAL EXAMINATION: General appearance: alert,  cooperative and no distress Head: Normocephalic, without obvious abnormality, atraumatic Neck: no adenopathy Lymph nodes: Cervical, supraclavicular, and axillary nodes normal. Resp: clear to auscultation bilaterally Cardio: regular rate and rhythm, S1, S2 normal, no murmur, click, rub or gallop GI: soft, non-tender; bowel sounds normal; no masses,  no organomegaly Extremities: extremities normal, atraumatic, no cyanosis or edema  ECOG PERFORMANCE STATUS: 1 - Symptomatic but completely ambulatory  Blood pressure 109/76, pulse 60, temperature 97.8 F (36.6 C), temperature source Oral, resp. rate 18, height 5\' 2"  (1.575 m), weight 119 lb 11.2 oz (54.296 kg).  LABORATORY DATA: Lab Results  Component Value Date   WBC 4.7 06/16/2013   HGB 13.2 06/16/2013   HCT 39.3 06/16/2013   MCV 94.8 06/16/2013   PLT 179 06/16/2013      Chemistry      Component Value Date/Time   NA 139 08/15/2012 1600   NA 139 06/07/2012 1621   K 4.2 08/15/2012 1600   K 4.0 06/07/2012 1621   CL 103 08/15/2012 1600   CL 103 06/07/2012 1621   CO2 27 08/15/2012 1600   CO2 28 06/07/2012 1621   BUN 15.0 08/15/2012 1600   BUN 12 06/07/2012 1621   CREATININE 0.7 08/15/2012 1600   CREATININE 0.49* 06/07/2012 1621      Component Value Date/Time   CALCIUM 9.2 08/15/2012 1600   CALCIUM 9.4 06/07/2012 1621   ALKPHOS 50 08/15/2012 1600   ALKPHOS 45 06/07/2012 1621   AST 16 08/15/2012 1600   AST 19 06/07/2012 1621   ALT 11 08/15/2012 1600  ALT 12 06/07/2012 1621   BILITOT 0.40 08/15/2012 1600   BILITOT 0.4 06/07/2012 1621       RADIOGRAPHIC STUDIES: No results found.  ASSESSMENT AND PLAN: This is a very pleasant 47 years old white female with hereditary hemochromatosis with homozygous C282Y mutation. She status post phlebotomy last one was performed in January of 2014 and she has been observation since that time with no significant worsening of her disease. I discussed the lab result with the patient today. I recommended for her to continue on  observation with repeat CBC and iron study in 3 months. She was advised to call immediately if she has any concerning symptoms in the interval.  All questions were answered. The patient knows to call the clinic with any problems, questions or concerns. We can certainly see the patient much sooner if necessary.

## 2013-09-10 ENCOUNTER — Telehealth: Payer: Self-pay | Admitting: Internal Medicine

## 2013-09-10 NOTE — Telephone Encounter (Signed)
Pt called today and r/s October 2014 pof to January 2015 due to ins issues. Message to MM.

## 2013-09-12 ENCOUNTER — Other Ambulatory Visit: Payer: BC Managed Care – PPO | Admitting: Lab

## 2013-09-16 ENCOUNTER — Ambulatory Visit: Payer: BC Managed Care – PPO | Admitting: Internal Medicine

## 2013-09-29 ENCOUNTER — Ambulatory Visit (INDEPENDENT_AMBULATORY_CARE_PROVIDER_SITE_OTHER): Payer: BC Managed Care – PPO | Admitting: Physician Assistant

## 2013-09-29 VITALS — BP 108/68 | HR 72 | Temp 98.0°F | Resp 18 | Ht 61.5 in | Wt 122.0 lb

## 2013-09-29 DIAGNOSIS — Z0000 Encounter for general adult medical examination without abnormal findings: Secondary | ICD-10-CM

## 2013-09-29 DIAGNOSIS — Z23 Encounter for immunization: Secondary | ICD-10-CM

## 2013-09-29 DIAGNOSIS — Z01419 Encounter for gynecological examination (general) (routine) without abnormal findings: Secondary | ICD-10-CM

## 2013-09-29 LAB — CBC WITH DIFFERENTIAL/PLATELET
Basophils Absolute: 0 10*3/uL (ref 0.0–0.1)
Basophils Relative: 0 % (ref 0–1)
Eosinophils Absolute: 0.1 10*3/uL (ref 0.0–0.7)
Eosinophils Relative: 1 % (ref 0–5)
HCT: 40.7 % (ref 36.0–46.0)
Hemoglobin: 14 g/dL (ref 12.0–15.0)
Lymphocytes Relative: 44 % (ref 12–46)
Lymphs Abs: 1.7 10*3/uL (ref 0.7–4.0)
MCH: 32.3 pg (ref 26.0–34.0)
MCHC: 34.4 g/dL (ref 30.0–36.0)
MCV: 93.8 fL (ref 78.0–100.0)
Monocytes Absolute: 0.4 10*3/uL (ref 0.1–1.0)
Monocytes Relative: 9 % (ref 3–12)
Neutro Abs: 1.7 10*3/uL (ref 1.7–7.7)
Neutrophils Relative %: 46 % (ref 43–77)
Platelets: 173 10*3/uL (ref 150–400)
RBC: 4.34 MIL/uL (ref 3.87–5.11)
RDW: 13.2 % (ref 11.5–15.5)
WBC: 3.8 10*3/uL — ABNORMAL LOW (ref 4.0–10.5)

## 2013-09-29 LAB — COMPREHENSIVE METABOLIC PANEL
ALT: 20 U/L (ref 0–35)
AST: 22 U/L (ref 0–37)
Albumin: 4.4 g/dL (ref 3.5–5.2)
Alkaline Phosphatase: 52 U/L (ref 39–117)
BUN: 12 mg/dL (ref 6–23)
CO2: 28 mEq/L (ref 19–32)
Calcium: 9.1 mg/dL (ref 8.4–10.5)
Chloride: 105 mEq/L (ref 96–112)
Creat: 0.45 mg/dL — ABNORMAL LOW (ref 0.50–1.10)
Glucose, Bld: 64 mg/dL — ABNORMAL LOW (ref 70–99)
Potassium: 3.9 mEq/L (ref 3.5–5.3)
Sodium: 140 mEq/L (ref 135–145)
Total Bilirubin: 0.6 mg/dL (ref 0.3–1.2)
Total Protein: 7.3 g/dL (ref 6.0–8.3)

## 2013-09-29 LAB — LIPID PANEL
Cholesterol: 157 mg/dL (ref 0–200)
HDL: 61 mg/dL (ref >39)
LDL Cholesterol: 77 mg/dL (ref 0–99)
Total CHOL/HDL Ratio: 2.6 Ratio
Triglycerides: 94 mg/dL (ref <150)
VLDL: 19 mg/dL (ref 0–40)

## 2013-09-29 LAB — TSH: TSH: 1.281 u[IU]/mL (ref 0.350–4.500)

## 2013-09-29 LAB — VITAMIN D 25 HYDROXY (VIT D DEFICIENCY, FRACTURES): Vit D, 25-Hydroxy: 22 ng/mL — ABNORMAL LOW (ref 30–89)

## 2013-09-29 NOTE — Progress Notes (Signed)
  Subjective:    Patient ID: Ruth Harrison, female    DOB: August 22, 1966, 47 y.o.   MRN: [id number]  HPI 47 year old female presents for complete physical with a pap.  Last CPE 2011 or 2012 (unsure).  She does have hemochromatosis and sees hematology regularly for this.  Hx of partial hysterectomy secondary to endometriosis.  She is otherwise healthy. No hx of DM, HTN, or hyperlipidemia.   Patient is doing well with no other concerns today.   Mammogram 2011.  Colonoscopy 2009 - normal; was having "GI issues" likely secondary to hemochromatosis   Pap 2012 - normal Tdap - 2012  Works as an Environmental health practitioner at [employer].  Married without children.   Review of Systems  Constitutional: Negative.   HENT: Negative.   Eyes: Negative.   Respiratory: Negative.   Cardiovascular: Negative.   Gastrointestinal: Negative.   Endocrine: Negative.   Genitourinary: Negative.   Musculoskeletal: Negative.   Skin: Negative.   Allergic/Immunologic: Negative.   Neurological: Negative.   Hematological: Negative.   Psychiatric/Behavioral: Negative.        Objective:   Physical Exam  Constitutional: She is oriented to person, place, and time. She appears well-developed and well-nourished.  HENT:  Head: Normocephalic and atraumatic.  Right Ear: External ear normal.  Left Ear: External ear normal.  Mouth/Throat: Oropharynx is clear and moist. No oropharyngeal exudate.  Eyes: Conjunctivae and EOM are normal. Pupils are equal, round, and reactive to light.  Neck: Normal range of motion. Neck supple. No thyromegaly present.  Cardiovascular: Normal rate, regular rhythm and normal heart sounds.   Pulmonary/Chest: Effort normal and breath sounds normal.  Abdominal: Soft. Bowel sounds are normal. There is no tenderness. There is no rebound and no guarding.  Genitourinary: Vagina normal. Pelvic exam was performed with patient supine. Cervix exhibits no motion tenderness, no discharge and no  friability. Right adnexum displays no mass, no tenderness and no fullness. Left adnexum displays no mass, no tenderness and no fullness.  Lymphadenopathy:    She has no cervical adenopathy.  Neurological: She is alert and oriented to person, place, and time.  Psychiatric: She has a normal mood and affect. Her behavior is normal. Judgment and thought content normal.          Assessment & Plan:  Routine general medical examination at a health care facility - Plan: CBC with Differential, Comprehensive metabolic panel, Lipid panel, TSH, Vit D  25 hydroxy (rtn osteoporosis monitoring)  Routine gynecological examination - Plan: Pap IG w/ reflex to HPV when ASC-U  Need for prophylactic vaccination and inoculation against influenza - Plan: Flu Vaccine QUAD 36+ mos IM  Labs pending.  Will await vitamin D level - if low will restart supplements Form in my box for completion upon result of labs Patient to schedule self mammogram

## 2013-09-29 NOTE — Patient Instructions (Signed)
Schedule your mammogram at [facility] We will call you with your labs

## 2013-09-30 LAB — PAP IG W/ RFLX HPV ASCU

## 2013-10-01 ENCOUNTER — Other Ambulatory Visit: Payer: Self-pay | Admitting: Physician Assistant

## 2013-10-01 MED ORDER — VITAMIN D (ERGOCALCIFEROL) 1.25 MG (50000 UNIT) PO CAPS: 50000.0000 [IU] | ORAL_CAPSULE | ORAL | Status: DC

## 2013-11-22 ENCOUNTER — Other Ambulatory Visit: Payer: Self-pay | Admitting: Physician Assistant

## 2013-12-04 HISTORY — PX: SHOULDER SURGERY: SHX246

## 2013-12-11 ENCOUNTER — Other Ambulatory Visit: Payer: Self-pay | Admitting: *Deleted

## 2013-12-12 ENCOUNTER — Other Ambulatory Visit (HOSPITAL_BASED_OUTPATIENT_CLINIC_OR_DEPARTMENT_OTHER): Payer: BC Managed Care – PPO

## 2013-12-12 LAB — CBC WITH DIFFERENTIAL/PLATELET
BASO%: 0.5 % (ref 0.0–2.0)
BASOS ABS: 0 10*3/uL (ref 0.0–0.1)
EOS%: 3 % (ref 0.0–7.0)
Eosinophils Absolute: 0.2 10*3/uL (ref 0.0–0.5)
HCT: 40.7 % (ref 34.8–46.6)
HGB: 13.7 g/dL (ref 11.6–15.9)
LYMPH%: 43.2 % (ref 14.0–49.7)
MCH: 32.1 pg (ref 25.1–34.0)
MCHC: 33.6 g/dL (ref 31.5–36.0)
MCV: 95.6 fL (ref 79.5–101.0)
MONO#: 0.4 10*3/uL (ref 0.1–0.9)
MONO%: 8.2 % (ref 0.0–14.0)
NEUT#: 2.4 10*3/uL (ref 1.5–6.5)
NEUT%: 45.1 % (ref 38.4–76.8)
Platelets: 188 10*3/uL (ref 145–400)
RBC: 4.26 10*6/uL (ref 3.70–5.45)
RDW: 12.3 % (ref 11.2–14.5)
WBC: 5.2 10*3/uL (ref 3.9–10.3)
lymph#: 2.3 10*3/uL (ref 0.9–3.3)

## 2013-12-15 LAB — FERRITIN CHCC: FERRITIN: 43 ng/mL (ref 9–269)

## 2013-12-15 LAB — IRON AND TIBC CHCC
%SAT: 63 % — ABNORMAL HIGH (ref 21–57)
Iron: 151 ug/dL — ABNORMAL HIGH (ref 41–142)
TIBC: 240 ug/dL (ref 236–444)
UIBC: 90 ug/dL — ABNORMAL LOW (ref 120–384)

## 2013-12-17 ENCOUNTER — Ambulatory Visit: Payer: BC Managed Care – PPO | Admitting: Internal Medicine

## 2013-12-17 ENCOUNTER — Ambulatory Visit: Payer: BC Managed Care – PPO

## 2014-03-15 ENCOUNTER — Other Ambulatory Visit: Payer: Self-pay | Admitting: Physician Assistant

## 2014-07-06 ENCOUNTER — Other Ambulatory Visit: Payer: Self-pay | Admitting: Physician Assistant

## 2014-07-15 ENCOUNTER — Other Ambulatory Visit: Payer: Self-pay

## 2014-07-15 ENCOUNTER — Encounter: Payer: Self-pay | Admitting: Gastroenterology

## 2014-07-15 DIAGNOSIS — Z1231 Encounter for screening mammogram for malignant neoplasm of breast: Secondary | ICD-10-CM

## 2014-07-16 ENCOUNTER — Telehealth: Payer: Self-pay | Admitting: Internal Medicine

## 2014-07-16 NOTE — Telephone Encounter (Signed)
returned call and r/s jan 2015 lb/fu per pt. pt requested lab be done the friday before f/u. pt has new appts for 8/28 and 9/1.

## 2014-07-22 ENCOUNTER — Ambulatory Visit
Admission: RE | Admit: 2014-07-22 | Discharge: 2014-07-22 | Disposition: A | Discharge status: Home / Self Care | Payer: BC Managed Care – PPO | Source: Ambulatory Visit

## 2014-07-22 DIAGNOSIS — Z1231 Encounter for screening mammogram for malignant neoplasm of breast: Secondary | ICD-10-CM

## 2014-07-30 ENCOUNTER — Other Ambulatory Visit: Payer: Self-pay | Admitting: Medical Oncology

## 2014-07-31 ENCOUNTER — Other Ambulatory Visit (HOSPITAL_BASED_OUTPATIENT_CLINIC_OR_DEPARTMENT_OTHER): Payer: BC Managed Care – PPO

## 2014-07-31 LAB — COMPREHENSIVE METABOLIC PANEL (CC13)
ALBUMIN: 4 g/dL (ref 3.5–5.0)
ALT: 18 U/L (ref 0–55)
AST: 18 U/L (ref 5–34)
Alkaline Phosphatase: 49 U/L (ref 40–150)
Anion Gap: 7 mEq/L (ref 3–11)
BUN: 11.8 mg/dL (ref 7.0–26.0)
CO2: 27 mEq/L (ref 22–29)
Calcium: 9.1 mg/dL (ref 8.4–10.4)
Chloride: 106 mEq/L (ref 98–109)
Creatinine: 0.6 mg/dL (ref 0.6–1.1)
GLUCOSE: 96 mg/dL (ref 70–140)
POTASSIUM: 4 meq/L (ref 3.5–5.1)
SODIUM: 140 meq/L (ref 136–145)
TOTAL PROTEIN: 7.2 g/dL (ref 6.4–8.3)
Total Bilirubin: 0.36 mg/dL (ref 0.20–1.20)

## 2014-07-31 LAB — CBC WITH DIFFERENTIAL/PLATELET
BASO%: 0.2 % (ref 0.0–2.0)
Basophils Absolute: 0 10*3/uL (ref 0.0–0.1)
EOS ABS: 0.1 10*3/uL (ref 0.0–0.5)
EOS%: 1.8 % (ref 0.0–7.0)
HCT: 39.8 % (ref 34.8–46.6)
HGB: 13.4 g/dL (ref 11.6–15.9)
LYMPH#: 2.1 10*3/uL (ref 0.9–3.3)
LYMPH%: 45.7 % (ref 14.0–49.7)
MCH: 31.9 pg (ref 25.1–34.0)
MCHC: 33.7 g/dL (ref 31.5–36.0)
MCV: 94.8 fL (ref 79.5–101.0)
MONO#: 0.4 10*3/uL (ref 0.1–0.9)
MONO%: 9.5 % (ref 0.0–14.0)
NEUT%: 42.8 % (ref 38.4–76.8)
NEUTROS ABS: 1.9 10*3/uL (ref 1.5–6.5)
Platelets: 180 10*3/uL (ref 145–400)
RBC: 4.2 10*6/uL (ref 3.70–5.45)
RDW: 12.4 % (ref 11.2–14.5)
WBC: 4.5 10*3/uL (ref 3.9–10.3)

## 2014-08-04 ENCOUNTER — Ambulatory Visit (HOSPITAL_BASED_OUTPATIENT_CLINIC_OR_DEPARTMENT_OTHER): Payer: BC Managed Care – PPO | Admitting: Internal Medicine

## 2014-08-04 ENCOUNTER — Telehealth: Payer: Self-pay | Admitting: Internal Medicine

## 2014-08-04 ENCOUNTER — Ambulatory Visit (HOSPITAL_BASED_OUTPATIENT_CLINIC_OR_DEPARTMENT_OTHER): Payer: BC Managed Care – PPO

## 2014-08-04 ENCOUNTER — Other Ambulatory Visit: Payer: Self-pay | Admitting: Medical Oncology

## 2014-08-04 DIAGNOSIS — R5383 Other fatigue: Secondary | ICD-10-CM

## 2014-08-04 DIAGNOSIS — R0609 Other forms of dyspnea: Secondary | ICD-10-CM

## 2014-08-04 DIAGNOSIS — R0989 Other specified symptoms and signs involving the circulatory and respiratory systems: Secondary | ICD-10-CM

## 2014-08-04 DIAGNOSIS — R5381 Other malaise: Secondary | ICD-10-CM

## 2014-08-04 LAB — FERRITIN CHCC: Ferritin: 49 ng/ml (ref 9–269)

## 2014-08-04 LAB — IRON AND TIBC CHCC
%SAT: 48 % (ref 21–57)
Iron: 107 ug/dL (ref 41–142)
TIBC: 225 ug/dL — ABNORMAL LOW (ref 236–444)
UIBC: 118 ug/dL — ABNORMAL LOW (ref 120–384)

## 2014-08-04 NOTE — Telephone Encounter (Signed)
gv adn printed appt sched and avs fo rpt for Dec...sent pt to lab

## 2014-08-04 NOTE — Progress Notes (Signed)
[facility] Telephone:(336) [phone]   Fax:(336) [phone]  OFFICE PROGRESS NOTE  [name], PA-C [address] [place] [address]  DIAGNOSIS: Hereditary hemochromatosis with homozygous C282Y mutation.   PRIOR THERAPY: Weekly phlebotomies x3, last one was in January of 2014.   CURRENT THERAPY: None.  INTERVAL HISTORY: Ruth Harrison 48 y.o. female returns to the clinic today for followup visit. She was last seen 10 months ago and the patient missed several appointments since her last visit. The patient is feeling fine today with no specific complaints except for fatigue and snoring. The patient denied having any significant weight loss or night sweats. She denied having any chest pain, shortness of breath, cough or hemoptysis. She had repeat blood work performed recently and she is here for evaluation and discussion of her lab results.  MEDICAL HISTORY: Past Medical History  Diagnosis Date  . Allergy   . Endometriosis     ALLERGIES:  is allergic to oxycodone; penicillins; prednisone; valacyclovir hcl; and sulfa antibiotics.  MEDICATIONS:  Current Outpatient Prescriptions  Medication Sig Dispense Refill  . fexofenadine (ALLEGRA) 30 MG tablet Take 30 mg by mouth daily.      Marland Kitchen glucosamine-chondroitin 500-400 MG tablet Take 1 tablet by mouth daily.      . Probiotic Product (PROBIOTIC DAILY PO) Take by mouth.      Marland Kitchen UNABLE TO FIND Take by mouth as needed. Ibuactin Extra Stength      . Vitamin D, Ergocalciferol, (DRISDOL) 50000 UNITS CAPS capsule TAKE 1 CAPSULE BY MOUTH EVERY WEEK EVERY 7 DAYS  4 capsule  3   No current facility-administered medications for this visit.    SURGICAL HISTORY:  Past Surgical History  Procedure Laterality Date  . Abdominal hysterectomy  partial     REVIEW OF SYSTEMS:  A comprehensive review of systems was negative except for: Constitutional: positive for fatigue   PHYSICAL EXAMINATION: General appearance: alert,  cooperative and no distress Head: Normocephalic, without obvious abnormality, atraumatic Neck: no adenopathy Lymph nodes: Cervical, supraclavicular, and axillary nodes normal. Resp: clear to auscultation bilaterally Cardio: regular rate and rhythm, S1, S2 normal, no murmur, click, rub or gallop GI: soft, non-tender; bowel sounds normal; no masses,  no organomegaly Extremities: extremities normal, atraumatic, no cyanosis or edema  ECOG PERFORMANCE STATUS: 1 - Symptomatic but completely ambulatory  There were no vitals taken for this visit.  LABORATORY DATA: Lab Results  Component Value Date   WBC 4.5 07/31/2014   HGB 13.4 07/31/2014   HCT 39.8 07/31/2014   MCV 94.8 07/31/2014   PLT 180 07/31/2014      Chemistry      Component Value Date/Time   NA 140 07/31/2014 1606   NA 140 09/29/2013 1044   K 4.0 07/31/2014 1606   K 3.9 09/29/2013 1044   CL 105 09/29/2013 1044   CL 103 08/15/2012 1600   CO2 27 07/31/2014 1606   CO2 28 09/29/2013 1044   BUN 11.8 07/31/2014 1606   BUN 12 09/29/2013 1044   CREATININE 0.6 07/31/2014 1606   CREATININE 0.45* 09/29/2013 1044   CREATININE 0.49* 06/07/2012 1621      Component Value Date/Time   CALCIUM 9.1 07/31/2014 1606   CALCIUM 9.1 09/29/2013 1044   ALKPHOS 49 07/31/2014 1606   ALKPHOS 52 09/29/2013 1044   AST 18 07/31/2014 1606   AST 22 09/29/2013 1044   ALT 18 07/31/2014 1606   ALT 20 09/29/2013 1044   BILITOT  0.36 07/31/2014 1606   BILITOT 0.6 09/29/2013 1044       RADIOGRAPHIC STUDIES: No results found.  ASSESSMENT AND PLAN: This is a very pleasant 48 years old white female with hereditary hemochromatosis with homozygous C282Y mutation. She status post phlebotomy last one was performed in January of 2014 and she has been observation since that time. The patient has been observation recently and she missed several appointments. I recommended for her to have iron study and ferritin performed today for evaluation of her disease. If she has any  significant elevation of her ferritin level, I will arrange for the patient therapeutic phlebotomy in the next few day. I discussed the lab result with the patient today. I recommended for her to continue on observation with repeat CBC and iron study in 3 months. She was advised to call immediately if she has any concerning symptoms in the interval.  All questions were answered. The patient knows to call the clinic with any problems, questions or concerns. We can certainly see the patient much sooner if necessary.  Disclaimer: This note was dictated with voice recognition software. Similar sounding words can inadvertently be transcribed and may be missed upon review.

## 2014-08-05 ENCOUNTER — Telehealth: Payer: Self-pay | Admitting: Medical Oncology

## 2014-08-05 ENCOUNTER — Ambulatory Visit (HOSPITAL_BASED_OUTPATIENT_CLINIC_OR_DEPARTMENT_OTHER): Payer: BC Managed Care – PPO

## 2014-08-05 DIAGNOSIS — Z23 Encounter for immunization: Secondary | ICD-10-CM

## 2014-08-05 MED ORDER — INFLUENZA VAC SPLIT QUAD 0.5 ML IM SUSY
0.5000 mL | PREFILLED_SYRINGE | Freq: Once | INTRAMUSCULAR | Status: AC
  Administered 2014-08-05: 0.5 mL via INTRAMUSCULAR
  Filled 2014-08-05: qty 0.5

## 2014-08-05 NOTE — Telephone Encounter (Signed)
Per Dr Arbutus Ped I told pt she needs phlebotomy and it is scheduled for today per pt.

## 2014-08-05 NOTE — Progress Notes (Signed)
Per Dr Arbutus Ped proceed with phlebotomy today and  ferritin 49 from 08/04/14 and CBC/diff from 07/31/14.

## 2014-08-05 NOTE — Progress Notes (Signed)
Removed 500cc blood for therapeutic phlebotomy. Used 20 gauge angio cath per pt. Request. Patient tolerated well. No c/o. Drinking fluids during and after procedure.  Post VS stable. No c/o dizzyness with standing/ambulation.

## 2014-08-05 NOTE — Patient Instructions (Signed)

## 2014-09-23 ENCOUNTER — Other Ambulatory Visit (INDEPENDENT_AMBULATORY_CARE_PROVIDER_SITE_OTHER): Payer: BC Managed Care – PPO

## 2014-09-23 ENCOUNTER — Ambulatory Visit (INDEPENDENT_AMBULATORY_CARE_PROVIDER_SITE_OTHER): Payer: BC Managed Care – PPO | Admitting: Gastroenterology

## 2014-09-23 ENCOUNTER — Encounter: Payer: Self-pay | Admitting: Gastroenterology

## 2014-09-23 VITALS — BP 98/60 | HR 76 | Ht 61.0 in | Wt 125.1 lb

## 2014-09-23 DIAGNOSIS — K921 Melena: Secondary | ICD-10-CM

## 2014-09-23 DIAGNOSIS — K589 Irritable bowel syndrome without diarrhea: Secondary | ICD-10-CM

## 2014-09-23 DIAGNOSIS — K602 Anal fissure, unspecified: Secondary | ICD-10-CM | POA: Insufficient documentation

## 2014-09-23 LAB — TSH: TSH: 1.54 u[IU]/mL (ref 0.35–4.50)

## 2014-09-23 MED ORDER — DILTIAZEM GEL 2 %: 1.0000 "application " | Freq: Two times a day (BID) | CUTANEOUS | Status: DC

## 2014-09-23 MED ORDER — RIFAXIMIN 550 MG PO TABS: 550.0000 mg | ORAL_TABLET | Freq: Three times a day (TID) | ORAL | Status: DC

## 2014-09-23 NOTE — Patient Instructions (Signed)
We have sent the following medications to your pharmacy for you to pick up at your convenience: Diltiazem Xifaxan  Please follow up with Dr Russella DarStark on 10/20/14 @ 9:45 am.  We have given you a FODMAP diet to follow.  Your physician has requested that you go to the basement for the following lab work before leaving today: Celiac 10, TSH  YQ:[id number]:Robert Merla Richesoolittle, MD

## 2014-09-23 NOTE — Progress Notes (Addendum)
Patient ID: Ruth Harrison, female   DOB: [birth year], 48 y.o.   MRN: [id number]    HPI: Ruth Harrison is a 48 year old female who was self-referred for evaluation of a red bowel movements and rectal bleeding. Ruth Harrison states that for all of her adult life she has had "digestive issues "she states that her stools constantly alternate between days of formed stools and days of loose stools. She has excessive gas and bloating. Due to these complaints, she was evaluated by Dr. Loreta AveMann in 2010 and had a colonoscopy. She states she had 2 benign polyps removed at the time of colonoscopy. She was advised to use a probiotic. Since that time she states she will find a probiotic that works for a while and then it stops working she has tried alternating her diet but states she can eat something 1 day and feel well and eat at several days later and she has diarrhea with a she has been having occasional blood on the toilet tissue when she wipes she feels it is due for to a hemorrhoid but recently has pain and a tearing sensation when she moves her bowels. Her stools are typically not hard but she does have to strain to push them. She has no anorexia or early weight loss. She has no nausea or vomiting. She is not aware if there is a family history of colon cancer, colon polyps, or inflammatory bowel disease as she is adopted. She does have a history of ptosis and her last phlebotomy was last month.   Past Medical History  Diagnosis Date  . Allergy   . Endometriosis   . Colon polyps 2010  . Urinary tract infection   . Hemochromatosis, hereditary     Past Surgical History  Procedure Laterality Date  . Abdominal hysterectomy  partial   . Laparoscopic endometriosis fulguration     Family History  Problem Relation Age of Onset  . Adopted: Yes   History  Substance Use Topics  . Smoking status: Never Smoker   . Smokeless tobacco: Never Used  . Alcohol Use: No   Current Outpatient Prescriptions  Medication Sig Dispense  Refill  . DIGESTIVE ENZYMES PO Take 500 mg by mouth as needed. Pt takes 1 tablet as needed      . loratadine (CLARITIN) 10 MG tablet Take 10 mg by mouth daily.      Marland Kitchen. diltiazem 2 % GEL Apply 1 application topically 2 (two) times daily.  30 g  0  . rifaximin (XIFAXAN) 550 MG TABS tablet Take 1 tablet (550 mg total) by mouth 3 (three) times daily.  56 tablet  0   No current facility-administered medications for this visit.   Allergies  Allergen Reactions  . Oxycodone Hives  . Penicillins Itching  . Prednisone Shortness Of Breath    Chest pain and double vision  . Valacyclovir Hcl Hives  . Sulfa Antibiotics Hives     Review of Systems: Gen: Denies any fever, chills, sweats, anorexia, fatigue, weakness, malaise, weight loss, and sleep disorder CV: Denies chest pain, angina, palpitations, syncope, orthopnea, PND, peripheral edema, and claudication. Resp: Denies dyspnea at rest, dyspnea with exercise, cough, sputum, wheezing, coughing up blood, and pleurisy. GI: Denies vomiting blood, jaundice, and fecal incontinence.   Denies dysphagia or odynophagia. GU : Denies urinary burning, blood in urine, urinary frequency, urinary hesitancy, nocturnal urination, and urinary incontinence. MS: Denies joint pain, limitation of movement, and swelling, stiffness, low back pain, extremity pain. Denies muscle weakness, cramps, atrophy.  Derm: Denies rash, itching, dry skin, hives, moles, warts, or unhealing ulcers.  Psych: Denies depression, anxiety, memory loss, suicidal ideation, hallucinations, paranoia, and confusion. Heme: Denies bruising, bleeding, and enlarged lymph nodes. Neuro:  Denies any headaches, dizziness, paresthesias. Endo:  Denies any problems with DM, thyroid, adrenal function   Physical Exam: BP 98/60  Pulse 76  Ht 5\' 1"  (1.549 m)  Wt 125 lb 2 oz (56.756 kg)  BMI 23.65 kg/m2 Constitutional: Pleasant,well-developed, female in no acute distress. HEENT: Normocephalic and  atraumatic. Conjunctivae are normal. No scleral icterus. Neck supple. No adenopathy Cardiovascular: Normal rate, regular rhythm.  Pulmonary/chest: Effort normal and breath sounds normal. No wheezing, rales or rhonchi. Abdominal: Soft, nondistended, nontender. Bowel sounds active throughout. There are no masses palpable. No hepatomegaly. Rectal: skin tags noted, DRE exquisitely painful, suggestive of a fissure but no fissure was seen. Brown stool heme neg. Extremities: no edema Lymphadenopathy: No cervical adenopathy noted. Neurological: Alert and oriented to person place and time. Skin: Skin is warm and dry. No rashes noted. Psychiatric: Normal mood and affect. Behavior is normal.  ASSESSMENT AND PLAN: #1. Chronic erratic bowel movements, gas, bloating. These symptoms are most likely due to irritable bowel syndrome. A celiac panel and TSH will be obtained. She has been advised to adhere to a FODMAP diet. Trial of Xifaxan 550 mg 3 times daily for 2 weeks. She has been advised to use Gas-X qid as needed.   #2 Anal fissure. She will be given a trial of diltiazem 2% ointment to use twice a day for 6 weeks. She will use a stool softener as needed.  She will return for repeat check in 3-4 weeks to assess response to the above. At that time if she has less rectal pain she will likely be scheduled for colonoscopy. In the meantime she has signed a medical release form so that we can obtain a copy of her records including colonoscopy and pathology reports from Dr. Loreta AveMann.   Nykia Turko, Moise BoringLori P PA-C 09/23/2014 4:38 PM   09/25/14 Colonoscopy and path records from Dr. Loreta AveMann received and reviewed. She underwent colonoscopy to the terminal ileum on 12/17/2009 with a 7-8 mm tubular adenoma reviewed from the rectosigmoid colon. The colonoscopy was otherwise normal. 5 year interval colonoscopy.

## 2014-09-24 ENCOUNTER — Encounter: Payer: Self-pay | Admitting: *Deleted

## 2014-09-24 ENCOUNTER — Telehealth: Payer: Self-pay | Admitting: Gastroenterology

## 2014-09-24 LAB — CELIAC PANEL 10
Endomysial Screen: NEGATIVE
Gliadin IgA: 4 U/mL (ref <20)
Gliadin IgG: 22.6 U/mL — ABNORMAL HIGH (ref <20)
IGA: 177 mg/dL (ref 69–380)
Tissue Transglut Ab: 11.9 U/mL (ref <20)
Tissue Transglutaminase Ab, IgA: 3 U/mL (ref <20)

## 2014-09-24 NOTE — Telephone Encounter (Signed)
[facility] forward 4 pages to Dr. Russella DarStark

## 2014-09-25 ENCOUNTER — Telehealth: Payer: Self-pay | Admitting: *Deleted

## 2014-09-25 NOTE — Telephone Encounter (Signed)
Message copied by Richardson ChiquitoSMITH, Sebastion Jun N on Fri Sep 25, 2014  1:00 PM ------      Message from: Claudette HeadSTARK, MALCOLM T      Created: Fri Sep 25, 2014 12:59 PM       Please enter a colonoscopy recall for 12/2014. ------

## 2014-10-05 ENCOUNTER — Ambulatory Visit (INDEPENDENT_AMBULATORY_CARE_PROVIDER_SITE_OTHER): Payer: BC Managed Care – PPO | Admitting: Family Medicine

## 2014-10-05 ENCOUNTER — Encounter: Payer: Self-pay | Admitting: Family Medicine

## 2014-10-05 VITALS — BP 105/65 | HR 65 | Temp 97.6°F | Resp 16 | Ht 62.5 in | Wt 124.4 lb

## 2014-10-05 DIAGNOSIS — Z0000 Encounter for general adult medical examination without abnormal findings: Secondary | ICD-10-CM

## 2014-10-05 DIAGNOSIS — M25511 Pain in right shoulder: Secondary | ICD-10-CM

## 2014-10-05 DIAGNOSIS — Z13220 Encounter for screening for lipoid disorders: Secondary | ICD-10-CM

## 2014-10-05 DIAGNOSIS — G478 Other sleep disorders: Secondary | ICD-10-CM

## 2014-10-05 DIAGNOSIS — E559 Vitamin D deficiency, unspecified: Secondary | ICD-10-CM

## 2014-10-05 LAB — LIPID PANEL
CHOL/HDL RATIO: 2.8 ratio
CHOLESTEROL: 173 mg/dL (ref 0–200)
HDL: 62 mg/dL (ref >39)
LDL Cholesterol: 98 mg/dL (ref 0–99)
TRIGLYCERIDES: 66 mg/dL (ref <150)
VLDL: 13 mg/dL (ref 0–40)

## 2014-10-05 LAB — GLUCOSE, RANDOM: Glucose, Bld: 79 mg/dL (ref 70–99)

## 2014-10-05 NOTE — Patient Instructions (Signed)
Good to see you today!  I will be in touch with your labs and will fax in your form for you. We will get you back in with [facility] and will also set up a sleep study.   Please do work on exercise (this will help your mood and also protect your bone density).  Aim for about an hour of brisk walking most day of the week If you do decide to pursue FMLA for your shoulder please send the paperwork to me with a note concerning how many weeks you plan to be out.

## 2014-10-05 NOTE — Progress Notes (Signed)
[facility] [address], [place] [address] [phone]  Date:  10/05/2014   Name:  Ruth Harrison   DOB:  14-Sep-1966   MRN:  [id number]  PCP:  Tonye PearsonOLITTLE, ROBERT P, MD    Chief Complaint: Annual Exam   History of Present Illness:  Ruth Neatngela M Mcgrady is a 48 y.o. very pleasant female patient who presents with the following:  She is here today for a PE and wellness form for her husband's employer.   She had a partial hysterectomy due to endometriosis.  She still has her cervix and one ovary. Normal pap last year.   Mammogram done in September- ok except for dense breasts.   Her annual flu shot is done and tdap is UTD Fasting today for labs She sees Dr. Shirline FreesMohammed for her hemochromatosis Recent CMP normal, recent TSH normal She would like to recheck her vit D.  She has been on the once weekly form until she ran out several months ago She had a right shoulder injury (WC) a few years back- improved but seemed to re- injure a couple of years ago.  Is seeing a chriopractor but continues to have issues with it Also her husband notes that she snores loudly- wonders if she might have OSA.   Her employer and husband have also noted that she is "not quite herself."  She has been more irritable and short- tempered than usual, feels tired and does not feel like social involvement.  She is a little bit sad but not acutely depressed, denies any SI  She has discussed taking some FMLA with her employer and they would be supportive of this decision.    Patient Active Problem List   Diagnosis Date Noted  . IBS (irritable bowel syndrome) 09/23/2014  . Hematochezia 09/23/2014  . Anal fissure 09/23/2014  . Constipation 07/23/2012  . Hemochromatosis 01/31/2012  . PALPITATIONS 07/11/2010  . CHEST PAIN-UNSPECIFIED 07/11/2010    Past Medical History  Diagnosis Date  . Allergy   . Endometriosis   . Colon polyps 2010  . Urinary tract infection   . Hemochromatosis, hereditary      Past Surgical History  Procedure Laterality Date  . Abdominal hysterectomy  partial   . Laparoscopic endometriosis fulguration      History  Substance Use Topics  . Smoking status: Never Smoker   . Smokeless tobacco: Never Used  . Alcohol Use: No    Family History  Problem Relation Age of Onset  . Adopted: Yes    Allergies  Allergen Reactions  . Oxycodone Hives  . Penicillins Itching  . Prednisone Shortness Of Breath    Chest pain and double vision  . Valacyclovir Hcl Hives  . Sulfa Antibiotics Hives    Medication list has been reviewed and updated.  Current Outpatient Prescriptions on File Prior to Visit  Medication Sig Dispense Refill  . DIGESTIVE ENZYMES PO Take 500 mg by mouth as needed. Pt takes 1 tablet as needed    . rifaximin (XIFAXAN) 550 MG TABS tablet Take 1 tablet (550 mg total) by mouth 3 (three) times daily. 56 tablet 0  . diltiazem 2 % GEL Apply 1 application topically 2 (two) times daily. 30 g 0  . loratadine (CLARITIN) 10 MG tablet Take 10 mg by mouth daily.     No current facility-administered medications on file prior to visit.    Review of Systems:  As per HPI- otherwise negative.   Physical Examination: Filed Vitals:  10/05/14 1139  BP: 105/65  Pulse: 65  Temp: 97.6 F (36.4 C)  Resp: 16   Filed Vitals:   10/05/14 1139  Height: 5' 2.5" (1.588 m)  Weight: 124 lb 6.4 oz (56.427 kg)   Body mass index is 22.38 kg/(m^2). Ideal Body Weight: Weight in (lb) to have BMI = 25: 138.6  GEN: WDWN, NAD, Non-toxic, A & O x 3, slim build, looks well HEENT: Atraumatic, Normocephalic. Neck supple. No masses, No LAD.  Bilateral TM wnl, oropharynx normal.  PEERL,EOMI.   She has a small mouth and not much space in the oropharynx  Ears and Nose: No external deformity. CV: RRR, No M/G/R. No JVD. No thrill. No extra heart sounds. PULM: CTA B, no wheezes, crackles, rhonchi. No retractions. No resp. distress. No accessory muscle use. ABD: S, NT,  ND. No rebound. No HSM. EXTR: No c/c/e NEURO Normal gait.  PSYCH: Normally interactive. Conversant. Not depressed or anxious appearing.  Calm demeanor.  Breast exam normal bilaterally- no dimpling, discharge or masses Right shoulder: full ROM but she does have discomfort with full flexion and abduction.  Normal internal and external rotation   Assessment and Plan: Physical exam  Screening for hyperlipidemia - Plan: Lipid panel  Vitamin D deficiency - Plan: Vitamin D, 25-hydroxy  Poor sleep pattern - Plan: Nocturnal polysomnography (NPSG), Glucose, Random  Right shoulder pain - Plan: Ambulatory referral to Orthopedic Surgery  Await labs as above Fill out and fax form for her Referral back to [facility] for her right shoulder and for a sleep study She declines to start an SSRI at this time., but may be interested in taking some FLMA time for her shoulder.  She will let me know what she decides  Signed Abbe AmsterdamJessica Copland, MD

## 2014-10-06 LAB — VITAMIN D 25 HYDROXY (VIT D DEFICIENCY, FRACTURES): Vit D, 25-Hydroxy: 29 ng/mL — ABNORMAL LOW (ref 30–89)

## 2014-10-15 ENCOUNTER — Ambulatory Visit (HOSPITAL_BASED_OUTPATIENT_CLINIC_OR_DEPARTMENT_OTHER): Payer: BC Managed Care – PPO | Attending: Family Medicine

## 2014-10-15 DIAGNOSIS — G4700 Insomnia, unspecified: Secondary | ICD-10-CM | POA: Diagnosis present

## 2014-10-15 DIAGNOSIS — G4730 Sleep apnea, unspecified: Secondary | ICD-10-CM | POA: Diagnosis present

## 2014-10-15 DIAGNOSIS — G478 Other sleep disorders: Secondary | ICD-10-CM

## 2014-10-20 ENCOUNTER — Ambulatory Visit (INDEPENDENT_AMBULATORY_CARE_PROVIDER_SITE_OTHER): Payer: BC Managed Care – PPO | Admitting: Gastroenterology

## 2014-10-20 ENCOUNTER — Encounter: Payer: Self-pay | Admitting: Gastroenterology

## 2014-10-20 ENCOUNTER — Other Ambulatory Visit: Payer: BC Managed Care – PPO

## 2014-10-20 VITALS — BP 100/70 | HR 80 | Ht 61.5 in | Wt 125.6 lb

## 2014-10-20 DIAGNOSIS — K589 Irritable bowel syndrome without diarrhea: Secondary | ICD-10-CM

## 2014-10-20 DIAGNOSIS — K921 Melena: Secondary | ICD-10-CM

## 2014-10-20 DIAGNOSIS — Z86010 Personal history of colonic polyps: Secondary | ICD-10-CM

## 2014-10-20 MED ORDER — MOVIPREP 100 G PO SOLR: 1.0000 | Freq: Once | ORAL | Status: DC

## 2014-10-20 NOTE — Patient Instructions (Addendum)
You have been scheduled for a colonoscopy. Please follow written instructions given to you at your visit today.  Please pick up your prep kit at the pharmacy within the next 1-3 days. If you use inhalers (even only as needed), please bring them with you on the day of your procedure. Your physician has requested that you go to www.startemmi.com and enter the access code given to you at your visit today. This web site gives a general overview about your procedure. However, you should still follow specific instructions given to you by our office regarding your preparation for the procedure.  Your physician has requested that you go to the basement for the following lab work before leaving today: HLA DQ Association for Celiac Disease  CC: Tami Lin, MD

## 2014-10-20 NOTE — Progress Notes (Signed)
    History of Present Illness: This is a 48 year old female returning for follow-up. Her rectal pain and rectal bleeding have improved. She still diltiazem cream but to perianal irritation so she discontinued it. She completed almost the full 2 weeks of Xifaxan but felt that was leading to GI side effects so she discontinued it. She feels that reducing wheat and lactose intake has helped her symptoms but she had persistent problems with abdominal bloating, constipation and intermittent diarrhea. She notes that stress tends to increase her symptoms.  Current Medications, Allergies, Past Medical History, Past Surgical History, Family History and Social History were reviewed in [employer] record.  Physical Exam: General: Well developed , well nourished, no acute distress Head: Normocephalic and atraumatic Eyes:  sclerae anicteric, EOMI Ears: Normal auditory acuity Mouth: No deformity or lesions Lungs: Clear throughout to auscultation Heart: Regular rate and rhythm; no murmurs, rubs or bruits Abdomen: Soft, non tender and non distended. No masses, hepatosplenomegaly or hernias noted. Normal Bowel sounds Musculoskeletal: Symmetrical with no gross deformities  Pulses:  Normal pulses noted Extremities: No clubbing, cyanosis, edema or deformities noted Neurological: Alert oriented x 4, grossly nonfocal Psychological:  Alert and cooperative. Normal mood and affect  Assessment and Recommendations:  1. Chronic erratic bowel movements, gas, bloating. These symptoms are most likely due to irritable bowel syndrome. A celiac panel showed an elevated Gliadin IgG. Obtain HLA testing to exclude celiac disease. If this cannot be excluded blood work will proceed with upper endoscopy and biopsies. Continue to avoid/minimize foods that exacerbate symptoms.  2. Anal fissure. Continue to use a stool softener as needed. Colonoscopy as below to further evaluate.  3. Personal history of  colonic tubular adenomas Schedule 5 year interval colonoscopy. The risks, benefits, and alternatives to colonoscopy with possible biopsy and possible polypectomy were discussed with the patient and they consent to proceed.

## 2014-10-23 ENCOUNTER — Ambulatory Visit (AMBULATORY_SURGERY_CENTER): Payer: BC Managed Care – PPO | Admitting: Gastroenterology

## 2014-10-23 ENCOUNTER — Encounter: Payer: Self-pay | Admitting: Gastroenterology

## 2014-10-23 VITALS — BP 133/76 | HR 58 | Temp 97.1°F | Resp 16 | Ht 61.5 in | Wt 125.0 lb

## 2014-10-23 DIAGNOSIS — Z86010 Personal history of colonic polyps: Secondary | ICD-10-CM

## 2014-10-23 DIAGNOSIS — K921 Melena: Secondary | ICD-10-CM

## 2014-10-23 MED ORDER — SODIUM CHLORIDE 0.9 % IV SOLN: 500.0000 mL | INTRAVENOUS | Status: DC

## 2014-10-23 NOTE — Op Note (Signed)
[facility] [address] [place], [id number]   COLONOSCOPY PROCEDURE REPORT  PATIENT: Ruth Harrison, [name]  MR#: [id number] BIRTHDATE: 10-Feb-1966 , 48  yrs. old GENDER: female ENDOSCOPIST: Meryl DareMalcolm T Stark, MD, [facility] REFERRED ZD:[id number]:Robert Merla Richesoolittle, M.D. PROCEDURE DATE:  10/23/2014 PROCEDURE:   Colonoscopy, diagnostic First Screening Colonoscopy - Avg.  risk and is 50 yrs.  old or older - No.  Prior Negative Screening - Now for repeat screening. N/A  History of Adenoma - Now for follow-up colonoscopy & has been > or = to 3 yrs.  Yes hx of adenoma.  Has been 3 or more years since last colonoscopy.  Polyps Removed Today? No.  Polyps Removed Today? No.  Recommend repeat exam, <10 yrs? Polyps Removed Today? No.  Recommend repeat exam, <10 yrs? Yes.  Polyps Removed Today? No.  Recommend repeat exam, <10 yrs? Yes.  High risk (family or personal hx). ASA CLASS:   Class II INDICATIONS:surveillance colonoscopy based on a history of adenomatous colonic polyp(s) and hematochezia. MEDICATIONS: Monitored anesthesia care and Propofol 200 mg IV DESCRIPTION OF PROCEDURE:   After the risks benefits and alternatives of the procedure were thoroughly explained, informed consent was obtained.  The digital rectal exam revealed no abnormalities of the rectum.   The LB [id number]  endoscope was introduced through the anus and advanced to the terminal ileum which was intubated for a short distance. No adverse events experienced.   The quality of the prep was excellent, using MoviPrep  The instrument was then slowly withdrawn as the colon was fully examined.  COLON FINDINGS: A normal appearing cecum, ileocecal valve, and appendiceal orifice were identified.  The ascending, transverse, descending, sigmoid colon, and rectum appeared unremarkable.   The examined terminal ileum appeared to be normal.  Retroflexed views revealed no abnormalities. The time to cecum=2 minutes 53  seconds. Withdrawal time=10 minutes 25 seconds.  The scope was withdrawn and the procedure completed. COMPLICATIONS: There were no immediate complications.  ENDOSCOPIC IMPRESSION: 1.   Normal colonoscopy 2.   terminal ileum appeared to be normal  RECOMMENDATIONS: 1.  Repeat Colonoscopy in 5 years.  eSigned:  Meryl DareMalcolm T Stark, MD, [facility] 10/23/2014 8:53 AM

## 2014-10-23 NOTE — Patient Instructions (Signed)
YOU HAD AN ENDOSCOPIC PROCEDURE TODAY AT THE [facility]: Refer to the procedure report that was given to you for any specific questions about what was found during the examination.  If the procedure report does not answer your questions, please call your gastroenterologist to clarify.  If you requested that your care partner not be given the details of your procedure findings, then the procedure report has been included in a sealed envelope for you to review at your convenience later.  YOU SHOULD EXPECT: Some feelings of bloating in the abdomen. Passage of more gas than usual.  Walking can help get rid of the air that was put into your GI tract during the procedure and reduce the bloating. If you had a lower endoscopy (such as a colonoscopy or flexible sigmoidoscopy) you may notice spotting of blood in your stool or on the toilet paper. If you underwent a bowel prep for your procedure, then you may not have a normal bowel movement for a few days.  DIET: Your first meal following the procedure should be a light meal and then it is ok to progress to your normal diet.  A half-sandwich or bowl of soup is an example of a good first meal.  Heavy or fried foods are harder to digest and may make you feel nauseous or bloated.  Likewise meals heavy in dairy and vegetables can cause extra gas to form and this can also increase the bloating.  Drink plenty of fluids but you should avoid alcoholic beverages for 24 hours.  ACTIVITY: Your care partner should take you home directly after the procedure.  You should plan to take it easy, moving slowly for the rest of the day.  You can resume normal activity the day after the procedure however you should NOT DRIVE or use heavy machinery for 24 hours (because of the sedation medicines used during the test).    SYMPTOMS TO REPORT IMMEDIATELY: A gastroenterologist can be reached at any hour.  During normal business hours, 8:30 AM to 5:00 PM Monday through Friday,  call [phone].  After hours and on weekends, please call the GI answering service at [phone] who will take a message and have the physician on call contact you.   Following lower endoscopy (colonoscopy or flexible sigmoidoscopy):  Excessive amounts of blood in the stool  Significant tenderness or worsening of abdominal pains  Swelling of the abdomen that is new, acute  Fever of 100F or higher  FOLLOW UP: If any biopsies were taken you will be contacted by phone or by letter within the next 1-3 weeks.  Call your gastroenterologist if you have not heard about the biopsies in 3 weeks.  Our staff will call the home number listed on your records the next business day following your procedure to check on you and address any questions or concerns that you may have at that time regarding the information given to you following your procedure. This is a courtesy call and so if there is no answer at the home number and we have not heard from you through the emergency physician on call, we will assume that you have returned to your regular daily activities without incident.  SIGNATURES/CONFIDENTIALITY: You and/or your care partner have signed paperwork which will be entered into your electronic medical record.  These signatures attest to the fact that that the information above on your After Visit Summary has been reviewed and is understood.  Full responsibility of the confidentiality of this   discharge information lies with you and/or your care-partner.  Recommendations Next colonoscopy in 5 years 

## 2014-10-23 NOTE — Progress Notes (Signed)
Patient awakening, vss report to rn 

## 2014-10-24 DIAGNOSIS — G478 Other sleep disorders: Secondary | ICD-10-CM

## 2014-10-24 NOTE — Sleep Study (Signed)
   NAME: Ruth Harrison DATE OF BIRTH:  1966-09-16 MEDICAL RECORD NUMBER [id number]  LOCATION: [facility]  PHYSICIAN: Ryver Poblete D  DATE OF STUDY: 10/15/2014  SLEEP STUDY TYPE: Nocturnal Polysomnogram               REFERRING PHYSICIAN: Copland, Gwenlyn FoundJessica C, MD  INDICATION FOR STUDY: Insomnia with sleep apnea  EPWORTH SLEEPINESS SCORE:   10/24 HEIGHT:   5'1" WEIGHT:  124 lbs   BMI 23 NECK SIZE: 14 in.  MEDICATIONS: Charted for review  SLEEP ARCHITECTURE: Total sleep time 312.5 minutes with sleep efficiency 85.3%. Stage I was 9.8%, stage II 75.2%, stage III absent, REM 15% of total sleep time. Sleep latency 20.5 minutes, REM latency 149 minutes, awake after sleep onset 26.5 minutes, arousal index 28.8, bedtime medication: None  RESPIRATORY DATA: Apnea hypopnea index (AHI) 23 per hour. 119 total events scored including 84 obstructive apneas, 4 mixed apneas, 31 hypopneas. Events were not positional. REM AHI 63.8 per hour. This study was ordered as a diagnostic polysomnogram without CPAP.  OXYGEN DATA: Loud snoring with oxygen desaturation to a nadir of 80% and mean saturation 94.2% on room air.  CARDIAC DATA: Sinus rhythm with PACs  MOVEMENT/PARASOMNIA: A few limb jerks were counted with no apparent effect on sleep. No bathroom trips.  IMPRESSION/ RECOMMENDATION:   1) Moderate obstructive sleep apnea/hypopnea syndrome, AHI 23.0 per hour with non-positional events. REM AHI 63.8 per hour. Loud snoring with oxygen desaturation to a nadir of 80% and mean saturation 94.2% on room air. 2) This study was ordered as a diagnostic polysomnogram (NPSG) without CPAP. If appropriate, this patient can return to the sleep disorder Center for a dedicated CPAP titration study.   Waymon BudgeYOUNG,Timberlee Roblero D Diplomate, American Board of Sleep Medicine  ELECTRONICALLY SIGNED ON:  10/24/2014, 10:25 AM [facility] PH: [phone]   FX: [phone] ACCREDITED BY  THE AMERICAN ACADEMY OF SLEEP MEDICINE

## 2014-10-25 ENCOUNTER — Encounter: Payer: Self-pay | Admitting: Family Medicine

## 2014-10-25 DIAGNOSIS — G4733 Obstructive sleep apnea (adult) (pediatric): Secondary | ICD-10-CM | POA: Insufficient documentation

## 2014-10-26 ENCOUNTER — Telehealth: Payer: Self-pay | Admitting: Family Medicine

## 2014-10-26 ENCOUNTER — Telehealth: Payer: Self-pay

## 2014-10-26 DIAGNOSIS — G4733 Obstructive sleep apnea (adult) (pediatric): Secondary | ICD-10-CM

## 2014-10-26 NOTE — Telephone Encounter (Signed)
Called her- it does look like she has OSA.  She is not interested in CPAP at this time, and does not think that she wants to consider surgery such as T and A.  However she might be interested in an oral appliance of some sort if it would help.   Will refer to ENT for their evaluation.    IMPRESSION/ RECOMMENDATION:  1) Moderate obstructive sleep apnea/hypopnea syndrome, AHI 23.0 per hour with non-positional events. REM AHI 63.8 per hour. Loud snoring with oxygen desaturation to a nadir of 80% and mean saturation 94.2% on room air. 2) This study was ordered as a diagnostic polysomnogram (NPSG) without CPAP. If appropriate, this patient can return to the sleep disorder Center for a dedicated CPAP titration study.

## 2014-10-26 NOTE — Telephone Encounter (Signed)
  Follow up Call-  Call back number 10/23/2014  Post procedure Call Back phone  # [phone]  Permission to leave phone message Yes     Patient questions:  Do you have a fever, pain , or abdominal swelling? No. Pain Score  0 *  Have you tolerated food without any problems? Yes.    Have you been able to return to your normal activities? Yes.    Do you have any questions about your discharge instructions: Diet   No. Medications  No. Follow up visit  No.  Do you have questions or concerns about your Care? No.  Actions: * If pain score is 4 or above: No action needed, pain <4.

## 2014-10-27 ENCOUNTER — Telehealth: Payer: Self-pay

## 2014-10-27 DIAGNOSIS — G4733 Obstructive sleep apnea (adult) (pediatric): Secondary | ICD-10-CM

## 2014-10-27 NOTE — Telephone Encounter (Signed)
Pt states she would like a call back re her sleep apnea and the different machines etc   Best phone for pt is [phone]

## 2014-10-28 LAB — CELIAC DISEASE HLA DQ ASSOC.
DQ2 (DQA1 0501/0505,DQB1 02XX): NEGATIVE
DQ8 (DQA1 03XX, DQB1 0302): NEGATIVE

## 2014-10-28 NOTE — Telephone Encounter (Signed)
Called her back- she had a sleep study which slowed sleep apnea.  She is not sure what if any treatment she would like to pursue.  Her study was read by Dr. Maple HudsonYoung so I will refer her to see him for a consultation

## 2014-11-03 ENCOUNTER — Other Ambulatory Visit: Payer: Self-pay | Admitting: Gastroenterology

## 2014-11-03 NOTE — Telephone Encounter (Signed)
Xifaxan is not an ongoing medication for patient. In addition, patient told us at her last office visit that she had some undesirable gi side effects from the medication. I have contacted [facility] to advise of this.

## 2014-11-04 ENCOUNTER — Telehealth: Payer: Self-pay | Admitting: Internal Medicine

## 2014-11-04 NOTE — Telephone Encounter (Signed)
returned call and s.w pt and r/s appt....pt ok and aware °

## 2014-11-06 ENCOUNTER — Encounter: Payer: Self-pay | Admitting: Pulmonary Disease

## 2014-11-06 ENCOUNTER — Other Ambulatory Visit: Payer: BC Managed Care – PPO

## 2014-11-06 ENCOUNTER — Ambulatory Visit (INDEPENDENT_AMBULATORY_CARE_PROVIDER_SITE_OTHER): Payer: BC Managed Care – PPO | Admitting: Pulmonary Disease

## 2014-11-06 ENCOUNTER — Other Ambulatory Visit (HOSPITAL_BASED_OUTPATIENT_CLINIC_OR_DEPARTMENT_OTHER): Payer: BC Managed Care – PPO

## 2014-11-06 VITALS — BP 104/60 | HR 75 | Temp 98.8°F | Ht 61.0 in | Wt 126.4 lb

## 2014-11-06 DIAGNOSIS — G4733 Obstructive sleep apnea (adult) (pediatric): Secondary | ICD-10-CM

## 2014-11-06 LAB — COMPREHENSIVE METABOLIC PANEL (CC13)
ALT: 22 U/L (ref 0–55)
ANION GAP: 9 meq/L (ref 3–11)
AST: 19 U/L (ref 5–34)
Albumin: 4.1 g/dL (ref 3.5–5.0)
Alkaline Phosphatase: 58 U/L (ref 40–150)
BILIRUBIN TOTAL: 0.3 mg/dL (ref 0.20–1.20)
BUN: 13 mg/dL (ref 7.0–26.0)
CALCIUM: 9.4 mg/dL (ref 8.4–10.4)
CHLORIDE: 103 meq/L (ref 98–109)
CO2: 29 meq/L (ref 22–29)
CREATININE: 0.7 mg/dL (ref 0.6–1.1)
GLUCOSE: 80 mg/dL (ref 70–140)
Potassium: 4.1 mEq/L (ref 3.5–5.1)
Sodium: 141 mEq/L (ref 136–145)
Total Protein: 7.4 g/dL (ref 6.4–8.3)

## 2014-11-06 LAB — CBC WITH DIFFERENTIAL/PLATELET
BASO%: 0.4 % (ref 0.0–2.0)
Basophils Absolute: 0 10*3/uL (ref 0.0–0.1)
EOS%: 1.3 % (ref 0.0–7.0)
Eosinophils Absolute: 0.1 10*3/uL (ref 0.0–0.5)
HCT: 42.3 % (ref 34.8–46.6)
HGB: 14.3 g/dL (ref 11.6–15.9)
LYMPH%: 35.4 % (ref 14.0–49.7)
MCH: 32.1 pg (ref 25.1–34.0)
MCHC: 33.8 g/dL (ref 31.5–36.0)
MCV: 95.1 fL (ref 79.5–101.0)
MONO#: 0.6 10*3/uL (ref 0.1–0.9)
MONO%: 10.1 % (ref 0.0–14.0)
NEUT#: 2.9 10*3/uL (ref 1.5–6.5)
NEUT%: 52.8 % (ref 38.4–76.8)
NRBC: 0 % (ref 0–0)
Platelets: 185 10*3/uL (ref 145–400)
RBC: 4.45 10*6/uL (ref 3.70–5.45)
RDW: 11.8 % (ref 11.2–14.5)
WBC: 5.5 10*3/uL (ref 3.9–10.3)
lymph#: 2 10*3/uL (ref 0.9–3.3)

## 2014-11-06 NOTE — Progress Notes (Signed)
Subjective:    Patient ID: Ruth Harrison, female    DOB: 1966/11/15, 48 y.o.   MRN: [id number]  HPI The patient is a 48 year old female who I've been asked to see for management of obstructive sleep apnea. She underwent a sleep study last month which showed an AHI of 23 events per hour. She has been noted to have loud snoring by her husband, but he has never commented on an abnormal breathing pattern during sleep. She denies having any choking arousals. The patient rarely awakens during the night, but does not feel rested in the mornings upon arising. She does have intermittent sleep pressure during the day while at work, but will fall asleep easily in the evenings watching television or movies. She denies any sleepiness with driving. The patient states that her weight is been neutral over the last 2 years, and her Epworth score today is abnormal at 10.   Sleep Questionnaire What time do you typically go to bed?( Between what hours) 10-11 PM 10-11 PM at 1541 on 11/06/14 by Tommie SamsMindy S Silva, CMA How long does it take you to fall asleep? <5 min <5 min at 1541 on 11/06/14 by Tommie SamsMindy S Silva, CMA How many times during the night do you wake up? 1 1 at 1541 on 11/06/14 by Tommie SamsMindy S Silva, CMA What time do you get out of bed to start your day? 0600 0600 at 1541 on 11/06/14 by Tommie SamsMindy S Silva, CMA Do you drive or operate heavy machinery in your occupation? No No at 1541 on 11/06/14 by Tommie SamsMindy S Silva, CMA How much has your weight changed (up or down) over the past two years? (In pounds) 1 lb (0.454 kg) 1 lb (0.454 kg) at 1541 on 11/06/14 by Tommie SamsMindy S Silva, CMA Have you ever had a sleep study before? Yes Yes at 1541 on 11/06/14 by Tommie SamsMindy S Silva, CMA If yes, [facility] at 1541 on 11/06/14 by Tommie SamsMindy S Silva, CMA If yes, date of study? 10/15/14 10/15/14 at 1541 on 11/06/14 by Tommie SamsMindy S Silva, CMA Do you currently use CPAP? No No at 1541 on 11/06/14 by Tommie SamsMindy S Silva, CMA Do you wear  oxygen at any time? No No at 1541 on 11/06/14 by Tommie SamsMindy S Silva, CMA   Review of Systems  Constitutional: Negative for fever and unexpected weight change.  HENT: Positive for congestion, ear pain and sore throat. Negative for dental problem, nosebleeds, postnasal drip, rhinorrhea, sinus pressure, sneezing and trouble swallowing.   Eyes: Negative for redness and itching.  Respiratory: Negative for cough, chest tightness, shortness of breath and wheezing.   Cardiovascular: Negative for palpitations and leg swelling.  Gastrointestinal: Positive for abdominal pain. Negative for nausea and vomiting.  Genitourinary: Negative for dysuria.  Musculoskeletal: Positive for arthralgias. Negative for joint swelling.  Skin: Negative for rash.  Neurological: Negative for headaches.  Hematological: Does not bruise/bleed easily.  Psychiatric/Behavioral: Negative for dysphoric mood. The patient is not nervous/anxious.        Objective:   Physical Exam Constitutional:  Well developed, no acute distress  HENT:  Nares patent without discharge  Oropharynx without exudate, palate and uvula are normal.  +small lower jaw with overbite  Eyes:  Perrla, eomi, no scleral icterus  Neck:  No JVD, no TMG  Cardiovascular:  Normal rate, regular rhythm, no rubs or gallops.  No murmurs        Intact distal pulses  Pulmonary :  Normal breath sounds, no stridor or  respiratory distress   No rales, rhonchi, or wheezing  Abdominal:  Soft, nondistended, bowel sounds present.  No tenderness noted.   Musculoskeletal:  No lower extremity edema noted.  Lymph Nodes:  No cervical lymphadenopathy noted  Skin:  No cyanosis noted  Neurologic:  Alert, appropriate, moves all 4 extremities without obvious deficit.          Assessment & Plan:

## 2014-11-06 NOTE — Patient Instructions (Signed)
Will start on cpap at a moderate pressure level.  Please call if having tolerance issues. followup with me again in 8 weeks.

## 2014-11-06 NOTE — Assessment & Plan Note (Signed)
The patient has moderate obstructive sleep apnea by her recent study, but really does not have significant weight to lose. I suspect her sleep-disordered breathing is secondary to her abnormal mandibular anatomy, and I have discussed with her the non-C Pap options such as a dental appliance or possibly orthodontics. After a long discussion, she has decided to try C Pap first, and then can look at other dental options if she has poor tolerance. I will set the patient up on cpap at a moderate pressure level to allow for desensitization, and will troubleshoot the device over the next 4-6weeks if needed.  The pt is to call me if having issues with tolerance.  Will then optimize the pressure once patient is able to wear cpap on a consistent basis.

## 2014-11-09 ENCOUNTER — Telehealth: Payer: Self-pay | Admitting: Internal Medicine

## 2014-11-09 LAB — IRON AND TIBC CHCC
%SAT: 39 % (ref 21–57)
IRON: 105 ug/dL (ref 41–142)
TIBC: 271 ug/dL (ref 236–444)
UIBC: 167 ug/dL (ref 120–384)

## 2014-11-09 LAB — FERRITIN CHCC: Ferritin: 23 ng/ml (ref 9–269)

## 2014-11-09 NOTE — Telephone Encounter (Signed)
s.w pt and r/s appt due to death in family....pt ok adna ware of [place]

## 2014-11-10 ENCOUNTER — Ambulatory Visit: Payer: BC Managed Care – PPO | Admitting: Internal Medicine

## 2014-11-23 ENCOUNTER — Telehealth: Payer: Self-pay | Admitting: Pulmonary Disease

## 2014-11-23 DIAGNOSIS — G4733 Obstructive sleep apnea (adult) (pediatric): Secondary | ICD-10-CM

## 2014-11-23 NOTE — Telephone Encounter (Signed)
Spoke with pt, states she received her cpap last week and does not like it.  Is interested in a oral appliance instead for her osa.  Pt states this is covered by her insurance, just needs a referral to Dr. Althea GrimmerMark Katz for this.  Would like have this done by the end of the year if possible.  Dr Shelle Ironlance please advise if you are ok with this order.  Thanks!

## 2014-11-24 NOTE — Telephone Encounter (Signed)
Orders sent to [facility]  Spoke with the pt and notified that this was done  Nothing further needed

## 2014-11-24 NOTE — Telephone Encounter (Signed)
Ok with me.  Also send an order to pick up her cpap. Thanks.

## 2014-12-01 ENCOUNTER — Other Ambulatory Visit: Payer: Self-pay | Admitting: *Deleted

## 2014-12-02 ENCOUNTER — Encounter: Payer: Self-pay | Admitting: Internal Medicine

## 2014-12-02 ENCOUNTER — Ambulatory Visit (HOSPITAL_BASED_OUTPATIENT_CLINIC_OR_DEPARTMENT_OTHER): Payer: BC Managed Care – PPO | Admitting: Internal Medicine

## 2014-12-02 ENCOUNTER — Telehealth: Payer: Self-pay | Admitting: Internal Medicine

## 2014-12-02 NOTE — Telephone Encounter (Signed)
gv and printed appt sched and avs for pt for Apil 2016

## 2014-12-02 NOTE — Progress Notes (Signed)
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   PRIOR THERAPY: Weekly phlebotomies x3, last one was in January of 2014.   CURRENT THERAPY: Observation.  INTERVAL HISTORY: Ruth Harrison 48 y.o. female returns to the clinic today for followup visit. The patient has no complaints today except for arthralgia. She recently underwent right shoulder surgery under the care of Dr. Rennis ChrisSupple The patient denied having any significant weight loss or night sweats. She denied having any chest pain, shortness of breath, cough or hemoptysis. She had repeat blood work performed recently and she is here for evaluation and discussion of her lab results.  MEDICAL HISTORY: Past Medical History  Diagnosis Date  . Endometriosis   . Colon polyps 2010  . Urinary tract infection   . Hemochromatosis, hereditary   . Allergy     SEASONAL    ALLERGIES:  is allergic to oxycodone; penicillins; prednisone; valacyclovir hcl; and sulfa antibiotics.  MEDICATIONS:  Current Outpatient Prescriptions  Medication Sig Dispense Refill  . dextromethorphan-guaiFENesin (MUCINEX DM) 30-600 MG per 12 hr tablet Take 1 tablet by mouth 2 (two) times daily as needed for cough.    Marland Kitchen. DIGESTIVE ENZYMES PO Take 500 mg by mouth as needed. Pt takes 1 tablet as needed    . loratadine (CLARITIN) 10 MG tablet Take 10 mg by mouth daily.     No current facility-administered medications for this visit.    SURGICAL HISTORY:  Past Surgical History  Procedure Laterality Date  . Abdominal hysterectomy  partial   . Laparoscopic endometriosis fulguration      REVIEW OF SYSTEMS:  A comprehensive review of systems was negative except for: Musculoskeletal: positive for arthralgias   PHYSICAL EXAMINATION: General appearance: alert, cooperative and no  distress Head: Normocephalic, without obvious abnormality, atraumatic Neck: no adenopathy Lymph nodes: Cervical, supraclavicular, and axillary nodes normal. Resp: clear to auscultation bilaterally Cardio: regular rate and rhythm, S1, S2 normal, no murmur, click, rub or gallop GI: soft, non-tender; bowel sounds normal; no masses,  no organomegaly Extremities: extremities normal, atraumatic, no cyanosis or edema  ECOG PERFORMANCE STATUS: 1 - Symptomatic but completely ambulatory  Blood pressure 101/68, pulse 62, temperature 97.8 F (36.6 C), temperature source Oral, resp. rate 18, height 5\' 1"  (1.549 m), weight 126 lb 8 oz (57.38 kg), SpO2 100 %.  LABORATORY DATA: Lab Results  Component Value Date   WBC 5.5 11/06/2014   HGB 14.3 11/06/2014   HCT 42.3 11/06/2014   MCV 95.1 11/06/2014   PLT 185 11/06/2014      Chemistry      Component Value Date/Time   NA 141 11/06/2014 1518   NA 140 09/29/2013 1044   K 4.1 11/06/2014 1518   K 3.9 09/29/2013 1044   CL 105 09/29/2013 1044   CL 103 08/15/2012 1600   CO2 29 11/06/2014 1518   CO2 28 09/29/2013 1044   BUN 13.0 11/06/2014 1518   BUN 12 09/29/2013 1044   CREATININE 0.7 11/06/2014 1518   CREATININE 0.45* 09/29/2013 1044   CREATININE 0.49* 06/07/2012 1621      Component Value Date/Time   CALCIUM 9.4 11/06/2014 1518   CALCIUM 9.1 09/29/2013 1044   ALKPHOS 58 11/06/2014 1518   ALKPHOS 52 09/29/2013 1044   AST 19 11/06/2014 1518   AST 22 09/29/2013 1044   ALT 22 11/06/2014 1518  ALT 20 09/29/2013 1044   BILITOT 0.30 11/06/2014 1518   BILITOT 0.6 09/29/2013 1044       RADIOGRAPHIC STUDIES: No results found.  ASSESSMENT AND PLAN: This is a very pleasant 48 years old white female with hereditary hemochromatosis with homozygous C282Y mutation. She status post phlebotomy last one was performed in January of 2014 and she has been observation since that time. The recent CBC, iron study and ferritin showed no evidence for  worsening of her disease. I discussed the lab result with the patient. I recommended for her to continue on observation with repeat CBC and iron study in 4 months. She was advised to call immediately if she has any concerning symptoms in the interval.  All questions were answered. The patient knows to call the clinic with any problems, questions or concerns. We can certainly see the patient much sooner if necessary.  Disclaimer: This note was dictated with voice recognition software. Similar sounding words can inadvertently be transcribed and may be missed upon review.

## 2014-12-22 ENCOUNTER — Institutional Professional Consult (permissible substitution): Payer: BC Managed Care – PPO | Admitting: Internal Medicine

## 2014-12-23 ENCOUNTER — Encounter (HOSPITAL_BASED_OUTPATIENT_CLINIC_OR_DEPARTMENT_OTHER): Payer: BC Managed Care – PPO

## 2015-01-01 ENCOUNTER — Ambulatory Visit: Payer: BC Managed Care – PPO | Admitting: Pulmonary Disease

## 2015-01-31 ENCOUNTER — Ambulatory Visit (INDEPENDENT_AMBULATORY_CARE_PROVIDER_SITE_OTHER): Payer: BLUE CROSS/BLUE SHIELD | Admitting: Emergency Medicine

## 2015-01-31 VITALS — BP 114/72 | HR 79 | Temp 97.9°F | Resp 18 | Ht 61.0 in | Wt 127.6 lb

## 2015-01-31 DIAGNOSIS — J0100 Acute maxillary sinusitis, unspecified: Secondary | ICD-10-CM

## 2015-01-31 MED ORDER — AZITHROMYCIN 250 MG PO TABS: ORAL_TABLET | ORAL | Status: DC

## 2015-01-31 NOTE — Progress Notes (Signed)
   Subjective:    Patient ID: Ruth Harrison, female    DOB: 1966/11/21, 49 y.o.   MRN: [id number] This chart was scribed for Ruth ChrisSteven Jayleigh Notarianni, MD by Jolene Provostobert Halas, Medical Scribe. This patient was seen in Room 1 and the patient's care was started at 9:09 AM.  Chief Complaint  Patient presents with  . Cough  . Chest Congestion  . Medication Refill    HPI HPI Comments: Ruth Neatngela M Ziolkowski is a 49 y.o. female who [facility] complaining of sinus congestion with green drainage for the last week. Pt states she coughs up thick green drainage in the morning. Pt has taken mucinex PTA. Pt states she has taken a zpac in the past.     Review of Systems  Constitutional: Negative for fever and chills.  HENT: Positive for congestion, postnasal drip and rhinorrhea.   Respiratory: Positive for cough.   Cardiovascular: Negative for chest pain.  Gastrointestinal: Negative for abdominal pain.       Objective:   Physical Exam  Constitutional: She is oriented to person, place, and time. She appears well-developed and well-nourished. No distress.  HENT:  Head: Normocephalic and atraumatic.  Crusting in left nare, with purulent drainage.  Eyes: Pupils are equal, round, and reactive to light.  Neck: Neck supple.  Cardiovascular: Normal rate.   Pulmonary/Chest: Effort normal. No respiratory distress.  Musculoskeletal: Normal range of motion.  Neurological: She is alert and oriented to person, place, and time. Coordination normal.  Skin: Skin is warm and dry. She is not diaphoretic.  Psychiatric: She has a normal mood and affect. Her behavior is normal.  Nursing note and vitals reviewed.      Assessment & Plan:   Patient has a postnasal drip with maxillary sinusitis. Will treat with saline, Z-Pak ,and Mucinex.I personally performed the services described in this documentation, which was scribed in my presence. The recorded information has been reviewed and is accurate.

## 2015-01-31 NOTE — Patient Instructions (Signed)
Use saline nasal spray every 2-3 hours. Continue Mucinex twice a day. Take Zithromax as instructed

## 2015-03-17 ENCOUNTER — Other Ambulatory Visit (HOSPITAL_BASED_OUTPATIENT_CLINIC_OR_DEPARTMENT_OTHER): Payer: BLUE CROSS/BLUE SHIELD

## 2015-03-17 LAB — CBC WITH DIFFERENTIAL/PLATELET
BASO%: 0.4 % (ref 0.0–2.0)
BASOS ABS: 0 10*3/uL (ref 0.0–0.1)
EOS%: 1.5 % (ref 0.0–7.0)
Eosinophils Absolute: 0.1 10*3/uL (ref 0.0–0.5)
HCT: 37.5 % (ref 34.8–46.6)
HEMOGLOBIN: 12.2 g/dL (ref 11.6–15.9)
LYMPH%: 32.5 % (ref 14.0–49.7)
MCH: 31 pg (ref 25.1–34.0)
MCHC: 32.6 g/dL (ref 31.5–36.0)
MCV: 95.1 fL (ref 79.5–101.0)
MONO#: 0.4 10*3/uL (ref 0.1–0.9)
MONO%: 8.3 % (ref 0.0–14.0)
NEUT%: 57.3 % (ref 38.4–76.8)
NEUTROS ABS: 2.9 10*3/uL (ref 1.5–6.5)
PLATELETS: 178 10*3/uL (ref 145–400)
RBC: 3.94 10*6/uL (ref 3.70–5.45)
RDW: 12.5 % (ref 11.2–14.5)
WBC: 5.1 10*3/uL (ref 3.9–10.3)
lymph#: 1.7 10*3/uL (ref 0.9–3.3)

## 2015-03-17 LAB — COMPREHENSIVE METABOLIC PANEL
ALBUMIN: 4 g/dL (ref 3.5–5.2)
ALT: 16 U/L (ref 0–35)
AST: 19 U/L (ref 0–37)
Alkaline Phosphatase: 45 U/L (ref 39–117)
BUN: 10 mg/dL (ref 6–23)
CALCIUM: 9 mg/dL (ref 8.4–10.5)
CHLORIDE: 107 meq/L (ref 96–112)
CO2: 25 mEq/L (ref 19–32)
Creatinine, Ser: 0.46 mg/dL — ABNORMAL LOW (ref 0.50–1.10)
GLUCOSE: 92 mg/dL (ref 70–99)
POTASSIUM: 4.1 meq/L (ref 3.5–5.3)
Sodium: 140 mEq/L (ref 135–145)
Total Bilirubin: 0.3 mg/dL (ref 0.2–1.2)
Total Protein: 6.7 g/dL (ref 6.0–8.3)

## 2015-03-18 LAB — IRON AND TIBC CHCC
%SAT: 42 % (ref 21–57)
Iron: 106 ug/dL (ref 41–142)
TIBC: 251 ug/dL (ref 236–444)
UIBC: 145 ug/dL (ref 120–384)

## 2015-03-18 LAB — FERRITIN CHCC: FERRITIN: 27 ng/mL (ref 9–269)

## 2015-03-24 ENCOUNTER — Encounter: Payer: Self-pay | Admitting: Internal Medicine

## 2015-03-24 ENCOUNTER — Telehealth: Payer: Self-pay | Admitting: Internal Medicine

## 2015-03-24 ENCOUNTER — Ambulatory Visit (HOSPITAL_BASED_OUTPATIENT_CLINIC_OR_DEPARTMENT_OTHER): Payer: BLUE CROSS/BLUE SHIELD | Admitting: Internal Medicine

## 2015-03-24 NOTE — Progress Notes (Signed)
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   PRIOR THERAPY: Weekly phlebotomies x3, last one was in January of 2014.   CURRENT THERAPY: Observation.  INTERVAL HISTORY: Ruth Harrison 49 y.o. female returns to the clinic today for followup visit. The patient has no complaints today except for arthralgia. The patient denied having any significant weight loss or night sweats. She denied having any chest pain, shortness of breath, cough or hemoptysis. She has no fever or chills, no nausea or vomiting. She had repeat blood work performed recently and she is here for evaluation and discussion of her lab results.  MEDICAL HISTORY: Past Medical History  Diagnosis Date  . Endometriosis   . Colon polyps 2010  . Urinary tract infection   . Hemochromatosis, hereditary   . Allergy     SEASONAL    ALLERGIES:  is allergic to oxycodone; penicillins; prednisone; valacyclovir hcl; and sulfa antibiotics.  MEDICATIONS:  Current Outpatient Prescriptions  Medication Sig Dispense Refill  . dextromethorphan-guaiFENesin (MUCINEX DM) 30-600 MG per 12 hr tablet Take 1 tablet by mouth 2 (two) times daily as needed for cough.    Marland Kitchen. DIGESTIVE ENZYMES PO Take 500 mg by mouth as needed. Pt takes 1 tablet as needed    . loratadine (CLARITIN) 10 MG tablet Take 10 mg by mouth daily.    . naproxen (NAPROSYN) 500 MG tablet   0   No current facility-administered medications for this visit.    SURGICAL HISTORY:  Past Surgical History  Procedure Laterality Date  . Abdominal hysterectomy  partial   . Laparoscopic endometriosis fulguration      REVIEW OF SYSTEMS:  A comprehensive review of systems was negative except for: Musculoskeletal: positive for arthralgias   PHYSICAL EXAMINATION: General appearance: alert, cooperative  and no distress Head: Normocephalic, without obvious abnormality, atraumatic Neck: no adenopathy Lymph nodes: Cervical, supraclavicular, and axillary nodes normal. Resp: clear to auscultation bilaterally Cardio: regular rate and rhythm, S1, S2 normal, no murmur, click, rub or gallop GI: soft, non-tender; bowel sounds normal; no masses,  no organomegaly Extremities: extremities normal, atraumatic, no cyanosis or edema  ECOG PERFORMANCE STATUS: 1 - Symptomatic but completely ambulatory  Blood pressure 106/59, pulse 70, temperature 97.9 F (36.6 C), temperature source Oral, resp. rate 18, height 5\' 1"  (1.549 m), weight 128 lb 11.2 oz (58.378 kg), SpO2 100 %.  LABORATORY DATA: Lab Results  Component Value Date   WBC 5.1 03/17/2015   HGB 12.2 03/17/2015   HCT 37.5 03/17/2015   MCV 95.1 03/17/2015   PLT 178 03/17/2015      Chemistry      Component Value Date/Time   NA 140 03/17/2015 1600   NA 141 11/06/2014 1518   K 4.1 03/17/2015 1600   K 4.1 11/06/2014 1518   CL 107 03/17/2015 1600   CL 103 08/15/2012 1600   CO2 25 03/17/2015 1600   CO2 29 11/06/2014 1518   BUN 10 03/17/2015 1600   BUN 13.0 11/06/2014 1518   CREATININE 0.46* 03/17/2015 1600   CREATININE 0.7 11/06/2014 1518   CREATININE 0.45* 09/29/2013 1044      Component Value Date/Time   CALCIUM 9.0 03/17/2015 1600   CALCIUM 9.4 11/06/2014 1518   ALKPHOS 45 03/17/2015 1600   ALKPHOS 58 11/06/2014 1518   AST 19 03/17/2015 1600   AST 19  11/06/2014 1518   ALT 16 03/17/2015 1600   ALT 22 11/06/2014 1518   BILITOT 0.3 03/17/2015 1600   BILITOT 0.30 11/06/2014 1518     Other lab results: Ferritin 27, serum iron 106, total iron binding capacity 251 and iron saturation 42%  RADIOGRAPHIC STUDIES: No results found.  ASSESSMENT AND PLAN: This is a very pleasant 49 years old white female with hereditary hemochromatosis with homozygous C282Y mutation. She status post phlebotomy last one was performed in January of 2014 and  she has been observation since that time. The patient is feeling fine today. The recent CBC, iron study and ferritin showed no evidence for worsening of her disease. I discussed the lab result with the patient. I recommended for her to continue on observation with repeat CBC and iron study in 6 months. She was advised to call immediately if she has any concerning symptoms in the interval.  All questions were answered. The patient knows to call the clinic with any problems, questions or concerns. We can certainly see the patient much sooner if necessary.  Disclaimer: This note was dictated with voice recognition software. Similar sounding words can inadvertently be transcribed and may be missed upon review.

## 2015-03-24 NOTE — Telephone Encounter (Signed)
gave and printed appt sched and avs fo rpt for OCT. °

## 2015-04-12 ENCOUNTER — Ambulatory Visit (INDEPENDENT_AMBULATORY_CARE_PROVIDER_SITE_OTHER): Payer: BLUE CROSS/BLUE SHIELD | Admitting: Internal Medicine

## 2015-04-12 VITALS — BP 116/78 | HR 73 | Temp 97.9°F | Resp 16 | Ht 62.5 in | Wt 129.2 lb

## 2015-04-12 DIAGNOSIS — L255 Unspecified contact dermatitis due to plants, except food: Secondary | ICD-10-CM | POA: Diagnosis not present

## 2015-04-12 MED ORDER — METHYLPREDNISOLONE 4 MG PO TABS: ORAL_TABLET | ORAL | Status: DC

## 2015-04-12 MED ORDER — FLUOCINONIDE 0.05 % EX CREA: 1.0000 "application " | TOPICAL_CREAM | Freq: Two times a day (BID) | CUTANEOUS | Status: DC

## 2015-04-12 NOTE — Progress Notes (Signed)
   Subjective:  This chart was scribed for Ellamae Siaobert Miah Boye, MD by Stann Oresung-Kai Tsai, Medical Scribe. This patient was seen in Room 2 and the patient's care was started at 5:43 PM.     Patient ID: Ruth Harrison, female    DOB: 02-Jan-1966, 49 y.o.   MRN: [id number]  HPI Ruth Neatngela M Vanleer is a 49 y.o. female who [facility] complaining of rash on right forearm due to coming into contact with poison ivy yesterday. She also stated of having similar symptoms before. She was prescribed prednisone in the past for poison ivy but reports too strong.     Review of Systems [place]    Objective:   Physical Exam  Constitutional: She is oriented to person, place, and time. She appears well-developed and well-nourished. No distress.  HENT:  Head: Normocephalic and atraumatic.  Mouth/Throat: Oropharynx is clear and moist. No oropharyngeal exudate.  Eyes: Pupils are equal, round, and reactive to light.  Neck: Neck supple.  Cardiovascular: Normal rate.   Pulmonary/Chest: Effort normal.  Musculoskeletal: She exhibits no edema.  Neurological: She is alert and oriented to person, place, and time. No cranial nerve deficit.  Skin: Skin is dry. Rash noted.  2cm patch of erythema vesicles on right arm  Psychiatric: She has a normal mood and affect. Her behavior is normal.  Nursing note and vitals reviewed.         Assessment & Plan:  Poison ivy-dermatitis --She will use Lidex first and use oral prednisone only if this spreads to much greater involvement of her body or face Meds ordered this encounter  Medications  . docusate sodium (COLACE) 100 MG capsule    Sig: Take 100 mg by mouth 2 (two) times daily.  . methylPREDNISolone (MEDROL) 4 MG tablet    Sig: Use Medrol Dosepak as packaged    Dispense:  21 tablet    Refill:  0  . fluocinonide cream (LIDEX) 0.05 %    Sig: Apply 1 application topically 2 (two) times daily.    Dispense:  30 g    Refill:  0    I have completed the patient encounter in  its entirety as documented by the scribe, with editing by me where necessary. Altonio Schwertner P. Merla Richesoolittle, M.D.

## 2015-04-12 NOTE — Patient Instructions (Signed)
Day 1: 24 mg on day 1 administered as 8 mg (2 tablets) before breakfast, 4 mg (1 tablet) after lunch, 4 mg (1 tablet) after supper, and 8 mg (2 tablets) at bedtime Day 2: 20 mg on day 2 administered as 4 mg (1 tablet) before breakfast, 4 mg (1 tablet) after lunch, 4 mg (1 tablet) after supper, and 8 mg (2 tablets) at bedtime Day 3: 16 mg on day 3 administered as 4 mg (1 tablet) before breakfast, 4 mg (1 tablet) after lunch, 4 mg (1 tablet) after supper, and 4 mg (1 tablet) at bedtime Day 4: 12 mg on day 4 administered as 4 mg (1 tablet) before breakfast, 4 mg (1 tablet) after lunch, and 4 mg (1 tablet) at bedtime Day 5: 8 mg on day 5 administered as 4 mg (1 tablet) before breakfast and 4 mg (1 tablet) at bedtime Day 6: 4 mg on day 6 administered as 4 mg (1 tablet) before breakfast</P>

## 2015-04-22 ENCOUNTER — Telehealth: Payer: Self-pay | Admitting: Medical Oncology

## 2015-04-22 ENCOUNTER — Other Ambulatory Visit: Payer: Self-pay | Admitting: Medical Oncology

## 2015-04-22 ENCOUNTER — Telehealth: Payer: Self-pay | Admitting: Internal Medicine

## 2015-04-22 NOTE — Telephone Encounter (Signed)
Pt requests cbc. Per Dr Arbutus PedMohamed  may order CBC and ferritin.

## 2015-04-22 NOTE — Telephone Encounter (Signed)
s.w pt and advised on May appt.....pt ok and aware °

## 2015-04-23 ENCOUNTER — Telehealth: Payer: Self-pay | Admitting: *Deleted

## 2015-04-23 ENCOUNTER — Other Ambulatory Visit (HOSPITAL_BASED_OUTPATIENT_CLINIC_OR_DEPARTMENT_OTHER): Payer: BLUE CROSS/BLUE SHIELD

## 2015-04-23 LAB — CBC WITH DIFFERENTIAL/PLATELET
BASO%: 0.6 % (ref 0.0–2.0)
Basophils Absolute: 0 10*3/uL (ref 0.0–0.1)
EOS%: 1.3 % (ref 0.0–7.0)
Eosinophils Absolute: 0.1 10*3/uL (ref 0.0–0.5)
HCT: 42.1 % (ref 34.8–46.6)
HEMOGLOBIN: 14 g/dL (ref 11.6–15.9)
LYMPH#: 1.7 10*3/uL (ref 0.9–3.3)
LYMPH%: 36.9 % (ref 14.0–49.7)
MCH: 31.5 pg (ref 25.1–34.0)
MCHC: 33.3 g/dL (ref 31.5–36.0)
MCV: 94.5 fL (ref 79.5–101.0)
MONO#: 0.4 10*3/uL (ref 0.1–0.9)
MONO%: 9 % (ref 0.0–14.0)
NEUT#: 2.3 10*3/uL (ref 1.5–6.5)
NEUT%: 52.2 % (ref 38.4–76.8)
Platelets: 190 10*3/uL (ref 145–400)
RBC: 4.46 10*6/uL (ref 3.70–5.45)
RDW: 12.5 % (ref 11.2–14.5)
WBC: 4.5 10*3/uL (ref 3.9–10.3)

## 2015-04-23 LAB — FERRITIN CHCC: FERRITIN: 34 ng/mL (ref 9–269)

## 2015-04-23 NOTE — Telephone Encounter (Signed)
Called pt after reviewing labs with PA, no phlebotomy needed at this time. Pt confirmed no further concerns.

## 2015-04-23 NOTE — Telephone Encounter (Signed)
SPOKE TO DR.MOHAMED'S NURSE, [employer] GARNER. SHE WILL CHECK WITH ADRENA JOHNSON,PA CONCERNING PT.'S RESULTS.

## 2015-05-11 ENCOUNTER — Ambulatory Visit (INDEPENDENT_AMBULATORY_CARE_PROVIDER_SITE_OTHER): Payer: BLUE CROSS/BLUE SHIELD | Admitting: Internal Medicine

## 2015-05-11 VITALS — BP 124/76 | HR 76 | Temp 98.2°F | Resp 18 | Ht 62.0 in | Wt 128.0 lb

## 2015-05-11 DIAGNOSIS — R350 Frequency of micturition: Secondary | ICD-10-CM | POA: Diagnosis not present

## 2015-05-11 DIAGNOSIS — K5909 Other constipation: Secondary | ICD-10-CM

## 2015-05-11 DIAGNOSIS — K219 Gastro-esophageal reflux disease without esophagitis: Secondary | ICD-10-CM | POA: Diagnosis not present

## 2015-05-11 DIAGNOSIS — R300 Dysuria: Secondary | ICD-10-CM | POA: Diagnosis not present

## 2015-05-11 DIAGNOSIS — K589 Irritable bowel syndrome without diarrhea: Secondary | ICD-10-CM | POA: Diagnosis not present

## 2015-05-11 DIAGNOSIS — R1010 Upper abdominal pain, unspecified: Secondary | ICD-10-CM

## 2015-05-11 LAB — POCT URINALYSIS DIPSTICK
Bilirubin, UA: NEGATIVE
GLUCOSE UA: NEGATIVE
NITRITE UA: POSITIVE
Protein, UA: 30
SPEC GRAV UA: 1.015
Urobilinogen, UA: 2
pH, UA: 7

## 2015-05-11 LAB — POCT UA - MICROSCOPIC ONLY
Casts, Ur, LPF, POC: NEGATIVE
Crystals, Ur, HPF, POC: NEGATIVE
MUCUS UA: NEGATIVE
Yeast, UA: NEGATIVE

## 2015-05-11 MED ORDER — DICYCLOMINE HCL 20 MG PO TABS: 20.0000 mg | ORAL_TABLET | Freq: Three times a day (TID) | ORAL | Status: DC

## 2015-05-11 MED ORDER — CIPROFLOXACIN HCL 250 MG PO TABS: 250.0000 mg | ORAL_TABLET | Freq: Two times a day (BID) | ORAL | Status: DC

## 2015-05-11 NOTE — Progress Notes (Signed)
Subjective:    Patient ID: Ruth Harrison, female    DOB: 04-17-1966, 49 y.o.   MRN: [id number] This chart was scribed for Ellamae Siaobert Doolittle, MD by Jolene Provostobert Halas, Medical Scribe. This patient was seen in Room 10 and the patient's care was started a 5:48 PM.  Chief Complaint  Patient presents with  . Urinary Frequency    since sunday   . burning with urination  . Abdominal Pain    HPI HPI Comments: Ruth Harrison is a 49 y.o. female who [facility] complaining of dysuria with associated urinary frequency for the last three days. Pt states she has not had a UTI in a long time. Pt states she is taking Azo OTC. Pt denies hematuria, nausea, vomiting. Pt endorses associated night sweats and abdominal pain.  Pt also states that she has persistent digestive issues, consisting of diarrhea and constipation. Pt states she has had these sx for multiple years.  Pt states that she has a burning sensation in her chest after she eats fatty foods. Pt also states she can drink water and feel this burning sensation. Pt states she has had two colonoscopies, the first found two polyps and the most recent was normal. Pt denies increased stress, or interrupted sleep due to stress. Pt states her job has been going more smoothly lately. Pt states she has never had an ultrasound of her gall bladder.   Patient Active Problem List   Diagnosis Date Noted  . Obstructive sleep apnea 10/25/2014  . Hx of adenomatous colonic polyps 10/20/2014  . IBS (irritable bowel syndrome) 09/23/2014  . Hematochezia 09/23/2014  . Anal fissure 09/23/2014  . Constipation 07/23/2012  . Hemochromatosis 01/31/2012  . PALPITATIONS 07/11/2010  . CHEST PAIN-UNSPECIFIED 07/11/2010    Review of Systems  Constitutional: Positive for diaphoresis. Negative for fever and chills.  Gastrointestinal: Positive for nausea and abdominal pain. Negative for vomiting.  Genitourinary: Positive for dysuria and frequency.       Objective:   Physical Exam  Constitutional: She is oriented to person, place, and time. She appears well-developed and well-nourished. No distress.  HENT:  Head: Normocephalic and atraumatic.  Mouth/Throat: Oropharynx is clear and moist.  Eyes: Conjunctivae and EOM are normal. Pupils are equal, round, and reactive to light.  Neck: Normal range of motion. Neck supple. No thyromegaly present.  Cardiovascular: Normal rate, regular rhythm, normal heart sounds and intact distal pulses.   No murmur heard. Pulmonary/Chest: Effort normal and breath sounds normal. No respiratory distress.  Abdominal: She exhibits no distension and no mass. There is no tenderness. There is no rebound and no guarding.  No CVA tenderness to percussion  Musculoskeletal: Normal range of motion. She exhibits no edema.  Neurological: She is alert and oriented to person, place, and time. No cranial nerve deficit. Coordination normal.  Skin: Skin is warm and dry. She is not diaphoretic.  Psychiatric: She has a normal mood and affect. Her behavior is normal.  Nursing note and vitals reviewed.      Assessment & Plan:   Frequent urination - Plan: POCT urinalysis dipstick, POCT UA - Microscopic Only, Urine culture  Burning with urination - Plan: POCT urinalysis dipstick, POCT UA - Microscopic Only, Urine culture  Pain of upper abdomen  Gastroesophageal reflux disease without esophagitis  Other constipation  IBS (irritable bowel syndrome)  Meds ordered this encounter  Medications  . ciprofloxacin (CIPRO) 250 MG tablet    Sig: Take 1 tablet (250 mg total) by mouth  2 (two) times daily.    Dispense:  10 tablet    Refill:  0  . dicyclomine (BENTYL) 20 MG tablet    Sig: Take 1 tablet (20 mg total) by mouth 4 (four) times daily -  before meals and at bedtime.    Dispense:  60 tablet    Refill:  1   Schedule gallbladder ultrasound Will consider other trials for IBS control for further workup since she has had 2 prior attempts  with gastroenterology I have completed the patient encounter in its entirety as documented by the scribe, with editing by me where necessary. Robert P. Merla Riches, M.D.

## 2015-05-14 LAB — URINE CULTURE

## 2015-05-22 ENCOUNTER — Ambulatory Visit (INDEPENDENT_AMBULATORY_CARE_PROVIDER_SITE_OTHER): Payer: BLUE CROSS/BLUE SHIELD | Admitting: Physician Assistant

## 2015-05-22 VITALS — BP 100/64 | HR 83 | Temp 99.1°F | Resp 16 | Ht 62.5 in | Wt 126.0 lb

## 2015-05-22 DIAGNOSIS — J069 Acute upper respiratory infection, unspecified: Secondary | ICD-10-CM | POA: Diagnosis not present

## 2015-05-22 DIAGNOSIS — M791 Myalgia, unspecified site: Secondary | ICD-10-CM

## 2015-05-22 DIAGNOSIS — B9789 Other viral agents as the cause of diseases classified elsewhere: Principal | ICD-10-CM

## 2015-05-22 LAB — POCT CBC
GRANULOCYTE PERCENT: 75 % (ref 37–80)
HCT, POC: 42.2 % (ref 37.7–47.9)
Hemoglobin: 14 g/dL (ref 12.2–16.2)
Lymph, poc: 1 (ref 0.6–3.4)
MCH, POC: 30.9 pg (ref 27–31.2)
MCHC: 33.2 g/dL (ref 31.8–35.4)
MCV: 92.9 fL (ref 80–97)
MID (CBC): 0.2 (ref 0–0.9)
MPV: 6.5 fL (ref 0–99.8)
PLATELET COUNT, POC: 190 10*3/uL (ref 142–424)
POC Granulocyte: 3.6 (ref 2–6.9)
POC MID %: 3.4 % (ref 0–12)
RBC: 4.55 M/uL (ref 4.04–5.48)
RDW, POC: 12.4 %
WBC: 4.8 10*3/uL (ref 4.6–10.2)

## 2015-05-22 LAB — POCT INFLUENZA A/B
INFLUENZA A, POC: NEGATIVE
INFLUENZA B, POC: NEGATIVE

## 2015-05-22 MED ORDER — PROMETHAZINE-DM 6.25-15 MG/5ML PO SYRP: 5.0000 mL | ORAL_SOLUTION | Freq: Four times a day (QID) | ORAL | Status: DC | PRN

## 2015-05-22 MED ORDER — IPRATROPIUM BROMIDE 0.03 % NA SOLN: 2.0000 | Freq: Two times a day (BID) | NASAL | Status: DC

## 2015-05-22 NOTE — Patient Instructions (Signed)
Drink plenty of water (64 oz/day) and get plenty of rest. Continue home mucinex twice a day If you have been prescribed a cough syrup, do not drive or operate heavy machinery while using this medication. If you have been prescribed a nasal spray, use twice a day. Schedule ibuprofen 400-600 mg three times  A day. If your symptoms are not improving in 7-10 days, return to clinic.

## 2015-05-22 NOTE — Progress Notes (Signed)
[facility] [address] [place] [address] [id number] [phone]  Date:  05/22/2015   Name:  Ruth Harrison   DOB:  January 21, 1966   MRN:  [id number]  PCP:  Abbe Amsterdam, MD    Chief Complaint: Cough   History of Present Illness:  This is a 49 y.o. female with PMH IBS, OSA who is presenting with cough and nasal congestion x 3 days. She has been sneezing and having rhinorrhea. She feels she is coughing up drainage from throat. Having full body myalgias. Having some low-grade fevers in the 99s. Having some mild bilateral ear pain. Denies sore throat, SOB, wheezing. Has a history of env allergies. Usually has one to two flares a year and takes loratidine every day. This feels very different than allergies. No history of asthma. Not a smoker. Tried mucinex, vit C and ibuprofen yesterday. Hasn't taken anything today.  Review of Systems:  Review of Systems See HPI  Patient Active Problem List   Diagnosis Date Noted  . Obstructive sleep apnea 10/25/2014  . Hx of adenomatous colonic polyps 10/20/2014  . IBS (irritable bowel syndrome) 09/23/2014  . Hematochezia 09/23/2014  . Anal fissure 09/23/2014  . Constipation 07/23/2012  . Hemochromatosis 01/31/2012  . PALPITATIONS 07/11/2010  . CHEST PAIN-UNSPECIFIED 07/11/2010    Prior to Admission medications   Medication Sig Start Date End Date Taking? Authorizing Provider  dextromethorphan-guaiFENesin (MUCINEX DM) 30-600 MG per 12 hr tablet Take 1 tablet by mouth 2 (two) times daily as needed for cough.   Yes Historical Provider, MD  dicyclomine (BENTYL) 20 MG tablet Take 1 tablet (20 mg total) by mouth 4 (four) times daily -  before meals and at bedtime. 05/11/15  Yes Tonye Pearson, MD  DIGESTIVE ENZYMES PO Take 500 mg by mouth as needed. Pt takes 1 tablet as needed   Yes Historical Provider, MD  docusate sodium (COLACE) 100 MG capsule Take 100 mg by mouth 2 (two) times daily.   prn Historical Provider, MD  loratadine  (CLARITIN) 10 MG tablet Take 10 mg by mouth daily.   Yes Historical Provider, MD  naproxen (NAPROSYN) 500 MG tablet  02/20/15  prn Historical Provider, MD    Allergies  Allergen Reactions  . Oxycodone Hives  . Penicillins Itching  . Prednisone Shortness Of Breath    Chest pain and double vision  . Valacyclovir Hcl Hives  . Sulfa Antibiotics Hives    Past Surgical History  Procedure Laterality Date  . Abdominal hysterectomy  partial   . Laparoscopic endometriosis fulguration    . Shoulder surgery  2015    right     History  Substance Use Topics  . Smoking status: Never Smoker   . Smokeless tobacco: Never Used  . Alcohol Use: No    Family History  Problem Relation Age of Onset  . Adopted: Yes    Medication list has been reviewed and updated.  Physical Examination:  Physical Exam  Constitutional: She is oriented to person, place, and time. She appears well-developed and well-nourished. No distress.  HENT:  Head: Normocephalic and atraumatic.  Right Ear: Hearing, tympanic membrane, external ear and ear canal normal.  Left Ear: Hearing, tympanic membrane, external ear and ear canal normal.  Nose: Mucosal edema present. Right sinus exhibits maxillary sinus tenderness. Right sinus exhibits no frontal sinus tenderness. Left sinus exhibits maxillary sinus tenderness. Left sinus exhibits no frontal sinus tenderness.  Mouth/Throat: Uvula is midline and mucous membranes are normal. Posterior oropharyngeal  erythema present. No oropharyngeal exudate or posterior oropharyngeal edema.  Eyes: Conjunctivae and lids are normal. Right eye exhibits no discharge. Left eye exhibits no discharge. No scleral icterus.  Cardiovascular: Normal rate, regular rhythm, normal heart sounds and normal pulses.   No murmur heard. Pulmonary/Chest: Effort normal and breath sounds normal. No respiratory distress. She has no wheezes. She has no rhonchi. She has no rales.  Musculoskeletal: Normal range of  motion.  Lymphadenopathy:       Head (right side): No submental, no submandibular and no tonsillar adenopathy present.       Head (left side): No submental, no submandibular and no tonsillar adenopathy present.    She has no cervical adenopathy.  Neurological: She is alert and oriented to person, place, and time.  Skin: Skin is warm, dry and intact. No lesion and no rash noted.  Psychiatric: She has a normal mood and affect. Her speech is normal and behavior is normal. Thought content normal.   BP 100/64 mmHg  Pulse 83  Temp(Src) 99.1 F (37.3 C) (Oral)  Resp 16  Ht 5' 2.5" (1.588 m)  Wt 126 lb (57.153 kg)  BMI 22.66 kg/m2  SpO2 98%  Results for orders placed or performed in visit on 05/22/15  POCT CBC  Result Value Ref Range   WBC 4.8 4.6 - 10.2 K/uL   Lymph, poc 1.0 0.6 - 3.4   POC LYMPH PERCENT 21..6 10 - 50 %L   MID (cbc) 0.2 0 - 0.9   POC MID % 3.4 0 - 12 %M   POC Granulocyte 3.6 2 - 6.9   Granulocyte percent 75.0 37 - 80 %G   RBC 4.55 4.04 - 5.48 M/uL   Hemoglobin 14.0 12.2 - 16.2 g/dL   HCT, POC 16.1 09.6 - 47.9 %   MCV 92.9 80 - 97 fL   MCH, POC 30.9 27 - 31.2 pg   MCHC 33.2 31.8 - 35.4 g/dL   RDW, POC 04.5 %   Platelet Count, POC 190 142 - 424 K/uL   MPV 6.5 0 - 99.8 fL  POCT Influenza A/B  Result Value Ref Range   Influenza A, POC Negative Negative   Influenza B, POC Negative Negative    Assessment and Plan:  1. Myalgia 2. Viral URI with cough CBC wnl, flu neg. Likely URI with viral etiology. Focus is on supportive care, see meds prescribed below. Follow up 7-10 days if symptoms not improving. - POCT CBC - POCT Influenza A/B - promethazine-dextromethorphan (PROMETHAZINE-DM) 6.25-15 MG/5ML syrup; Take 5 mLs by mouth 4 (four) times daily as needed for cough.  Dispense: 118 mL; Refill: 0 - ipratropium (ATROVENT) 0.03 % nasal spray; Place 2 sprays into both nostrils 2 (two) times daily.  Dispense: 30 mL; Refill: 0   Nicole V. Dyke Brackett, MHS [facility]  05/22/2015

## 2015-06-09 ENCOUNTER — Ambulatory Visit (INDEPENDENT_AMBULATORY_CARE_PROVIDER_SITE_OTHER): Payer: BLUE CROSS/BLUE SHIELD | Admitting: Internal Medicine

## 2015-06-09 ENCOUNTER — Encounter: Payer: Self-pay | Admitting: Internal Medicine

## 2015-06-09 VITALS — BP 115/72 | HR 66 | Temp 98.0°F | Resp 16 | Ht 61.0 in | Wt 129.0 lb

## 2015-06-09 DIAGNOSIS — M542 Cervicalgia: Secondary | ICD-10-CM

## 2015-06-09 DIAGNOSIS — K219 Gastro-esophageal reflux disease without esophagitis: Secondary | ICD-10-CM | POA: Diagnosis not present

## 2015-06-09 DIAGNOSIS — R1011 Right upper quadrant pain: Secondary | ICD-10-CM

## 2015-06-09 DIAGNOSIS — G8929 Other chronic pain: Secondary | ICD-10-CM | POA: Diagnosis not present

## 2015-06-09 DIAGNOSIS — K589 Irritable bowel syndrome without diarrhea: Secondary | ICD-10-CM | POA: Diagnosis not present

## 2015-06-09 MED ORDER — CYCLOBENZAPRINE HCL 5 MG PO TABS: ORAL_TABLET | ORAL | Status: DC

## 2015-06-09 MED ORDER — MELOXICAM 15 MG PO TABS: 15.0000 mg | ORAL_TABLET | Freq: Every day | ORAL | Status: DC

## 2015-06-09 NOTE — Progress Notes (Addendum)
Subjective:    Patient ID: Ruth Harrison, female    DOB: 09/06/1966, 49 y.o.   MRN: [id number]  HPI Patient presents today for follow up of abdominal pain. She started bentyl QID with some improvement, but has been going to the bathroom more frequently. Continues to have more indigestion. Pain worse with late night eating. She is sometimes bothered by milk. Indigestion/gas has been occuring consistently. She has some relief with digestive enzymes. Has tried probiotics in the past without as much relief. She has always had problems with constipation with intermittent diarrhea when IBS acts up. Per visit 05/11/15, patient was to have abdominal US. Unsure what happened, but order not found. She was treated for UTI at that time with full resolution of symptoms.   Has seen Dr. Russella Dar (GI) in the past who has done two colonoscopies. During first colonoscopy, 2 polyps were found, so repeat colonoscopy was done 5 years later.   Has had increased headaches and shoulder pain and tension headaches. Feels nausea. Has had headaches likes this before. Has neck stiffness and tightness. Has chiropractic care every two weeks with temporary improvement. Had old neck injury 30 years ago. Has had more pain in the last 10-12 years. Is not currently taking any medication for her neck or headache pain. No numbness or tingling, no weakness. Per patient- previous xrays showed degenerative changes only. Has disrupted sleep due to pain and inability to get comfortable. 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   Review of Systems  Gastrointestinal: Positive for nausea, abdominal pain, diarrhea and constipation. Negative for vomiting.  Musculoskeletal: Positive for neck pain and neck stiffness.  Neurological: Positive for headaches (top of head).  Psychiatric/Behavioral: Positive for sleep disturbance.      Objective:   Physical Exam  Constitutional: She appears well-developed and well-nourished.    HENT:  Head: Normocephalic.  Eyes: Conjunctivae are normal.  Neck: Spinous process tenderness present. No muscular tenderness present. No rigidity. Decreased range of motion (slightly decrease rotation to right.) present. No edema and no erythema present.  Cardiovascular: Normal rate, regular rhythm and normal heart sounds.   Pulmonary/Chest: Effort normal and breath sounds normal.  Abdominal: Soft. Bowel sounds are normal. She exhibits no distension and no mass. There is tenderness (generalized mild tenderness, wose RUQ). There is no rebound and no guarding.  Lymphadenopathy:    She has no cervical adenopathy.  Vitals reviewed.  BP 115/72 mmHg  Pulse 66  Temp(Src) 98 F (36.7 C)  Resp 16  Ht  (1.549 m)  Wt 129 lb (58.514 kg)  BMI 24.39 kg/m2 Wt Readings from Last 3 Encounters:  06/09/15 129 lb (58.514 kg)  05/22/15 126 lb (57.153 kg)  05/11/15 128 lb (58.06 kg)  4/16- normal CMP 6/16- normal CBC    Assessment & Plan:  1. Right upper quadrant pain - US Abdomen Complete; Future- she will let me know if she has not received a call to schedule this within the next 48 hours.  2. Neck pain of over 3 months duration - cyclobenzaprine (FLEXERIL) 5 MG tablet; Take 1-2 tablets at bedtime as needed for neck pain  Dispense: 30 tablet; Refill: 1 - meloxicam (MOBIC) 15 MG tablet; Take 1 tablet (15 mg total) by mouth daily.  Dispense: 30 tablet; Refill: 0 - discussed possible need for further work up if no improvement  3. IBS (irritable bowel syndrome) - discussed using Bentyl prn  4. Gastroesophageal reflux disease, esophagitis  presence not specified - instructed in OTC H2 blocker use (either ranitidine or famotidine) BID for 1-2 weeks then PRN  - Will determine follow up after US.  Olean Reeeborah Gessner, FNP-BC  [facility]  06/09/2015 10:27 PM I have participated in the care of this patient with the Advanced Practice Provider and agree  with Diagnosis and Plan as documented. Robert P. Merla Richesoolittle, M.D.

## 2015-06-09 NOTE — Patient Instructions (Signed)
Try over the counter Pepcid or Zantac (generic is fine) twice a day for 2-3 weeks.   Gastroesophageal Reflux Disease, Adult Gastroesophageal reflux disease (GERD) happens when acid from your stomach flows up into the esophagus. When acid comes in contact with the esophagus, the acid causes soreness (inflammation) in the esophagus. Over time, GERD may create small holes (ulcers) in the lining of the esophagus. CAUSES   Increased body weight. This puts pressure on the stomach, making acid rise from the stomach into the esophagus.  Smoking. This increases acid production in the stomach.  Drinking alcohol. This causes decreased pressure in the lower esophageal sphincter (valve or ring of muscle between the esophagus and stomach), allowing acid from the stomach into the esophagus.  Late evening meals and a full stomach. This increases pressure and acid production in the stomach.  A malformed lower esophageal sphincter. Sometimes, no cause is found. SYMPTOMS   Burning pain in the lower part of the mid-chest behind the breastbone and in the mid-stomach area. This may occur twice a week or more often.  Trouble swallowing.  Sore throat.  Dry cough.  Asthma-like symptoms including chest tightness, shortness of breath, or wheezing. DIAGNOSIS  Your caregiver may be able to diagnose GERD based on your symptoms. In some cases, X-rays and other tests may be done to check for complications or to check the condition of your stomach and esophagus. TREATMENT  Your caregiver may recommend over-the-counter or prescription medicines to help decrease acid production. Ask your caregiver before starting or adding any new medicines.  HOME CARE INSTRUCTIONS   Change the factors that you can control. Ask your caregiver for guidance concerning weight loss, quitting smoking, and alcohol consumption.  Avoid foods and drinks that make your symptoms worse, such as:  Caffeine or alcoholic  drinks.  Chocolate.  Peppermint or mint flavorings.  Garlic and onions.  Spicy foods.  Citrus fruits, such as oranges, lemons, or limes.  Tomato-based foods such as sauce, chili, salsa, and pizza.  Fried and fatty foods.  Avoid lying down for the 3 hours prior to your bedtime or prior to taking a nap.  Eat small, frequent meals instead of large meals.  Wear loose-fitting clothing. Do not wear anything tight around your waist that causes pressure on your stomach.  Raise the head of your bed 6 to 8 inches with wood blocks to help you sleep. Extra pillows will not help.  Only take over-the-counter or prescription medicines for pain, discomfort, or fever as directed by your caregiver.  Do not take aspirin, ibuprofen, or other nonsteroidal anti-inflammatory drugs (NSAIDs). SEEK IMMEDIATE MEDICAL CARE IF:   You have pain in your arms, neck, jaw, teeth, or back.  Your pain increases or changes in intensity or duration.  You develop nausea, vomiting, or sweating (diaphoresis).  You develop shortness of breath, or you faint.  Your vomit is green, yellow, black, or looks like coffee grounds or blood.  Your stool is red, bloody, or black. These symptoms could be signs of other problems, such as heart disease, gastric bleeding, or esophageal bleeding. MAKE SURE YOU:   Understand these instructions.  Will watch your condition.  Will get help right away if you are not doing well or get worse. Document Released: 08/30/2005 Document Revised: 02/12/2012 Document Reviewed: 06/09/2011 [facility] Patient Information 2015 [place], [place]. This information is not intended to replace advice given to you by your health care provider. Make sure you discuss any questions you have with your health  care provider.  

## 2015-06-18 ENCOUNTER — Ambulatory Visit
Admission: RE | Admit: 2015-06-18 | Discharge: 2015-06-18 | Disposition: A | Discharge status: Home / Self Care | Payer: BLUE CROSS/BLUE SHIELD | Source: Ambulatory Visit | Attending: Family Medicine | Admitting: Family Medicine

## 2015-06-18 DIAGNOSIS — R1011 Right upper quadrant pain: Secondary | ICD-10-CM

## 2015-06-19 ENCOUNTER — Other Ambulatory Visit: Payer: Self-pay | Admitting: Family Medicine

## 2015-06-19 DIAGNOSIS — R1011 Right upper quadrant pain: Secondary | ICD-10-CM

## 2015-06-24 ENCOUNTER — Encounter: Payer: Self-pay | Admitting: Internal Medicine

## 2015-06-24 MED ORDER — NITROFURANTOIN MONOHYD MACRO 100 MG PO CAPS: 100.0000 mg | ORAL_CAPSULE | Freq: Two times a day (BID) | ORAL | Status: DC

## 2015-06-24 NOTE — Telephone Encounter (Signed)
Meds ordered this encounter  Medications  . nitrofurantoin, macrocrystal-monohydrate, (MACROBID) 100 MG capsule    Sig: Take 1 capsule (100 mg total) by mouth 2 (two) times daily.    Dispense:  14 capsule    Refill:  0   Follow-up after treatment if still symptomatic or if you relapse so that we can reculture the urine

## 2015-07-10 ENCOUNTER — Emergency Department (HOSPITAL_COMMUNITY)
Admission: EM | Admit: 2015-07-10 | Discharge: 2015-07-11 | Disposition: A | Discharge status: Home / Self Care | Payer: BLUE CROSS/BLUE SHIELD | Attending: Emergency Medicine | Admitting: Emergency Medicine

## 2015-07-10 ENCOUNTER — Emergency Department (HOSPITAL_COMMUNITY): Payer: BLUE CROSS/BLUE SHIELD

## 2015-07-10 ENCOUNTER — Encounter (HOSPITAL_COMMUNITY): Payer: Self-pay | Admitting: Emergency Medicine

## 2015-07-10 DIAGNOSIS — R109 Unspecified abdominal pain: Secondary | ICD-10-CM

## 2015-07-10 DIAGNOSIS — G8929 Other chronic pain: Secondary | ICD-10-CM | POA: Diagnosis not present

## 2015-07-10 DIAGNOSIS — Z791 Long term (current) use of non-steroidal anti-inflammatories (NSAID): Secondary | ICD-10-CM | POA: Diagnosis not present

## 2015-07-10 DIAGNOSIS — Z79899 Other long term (current) drug therapy: Secondary | ICD-10-CM | POA: Diagnosis not present

## 2015-07-10 DIAGNOSIS — Z90710 Acquired absence of both cervix and uterus: Secondary | ICD-10-CM | POA: Insufficient documentation

## 2015-07-10 DIAGNOSIS — Z86010 Personal history of colonic polyps: Secondary | ICD-10-CM | POA: Diagnosis not present

## 2015-07-10 DIAGNOSIS — Z87440 Personal history of urinary (tract) infections: Secondary | ICD-10-CM | POA: Insufficient documentation

## 2015-07-10 DIAGNOSIS — Z8742 Personal history of other diseases of the female genital tract: Secondary | ICD-10-CM | POA: Insufficient documentation

## 2015-07-10 DIAGNOSIS — Z8719 Personal history of other diseases of the digestive system: Secondary | ICD-10-CM | POA: Insufficient documentation

## 2015-07-10 DIAGNOSIS — R1084 Generalized abdominal pain: Secondary | ICD-10-CM | POA: Diagnosis present

## 2015-07-10 DIAGNOSIS — Z880 Allergy status to penicillin: Secondary | ICD-10-CM | POA: Diagnosis not present

## 2015-07-10 HISTORY — DX: Irritable bowel syndrome without diarrhea: K58.9

## 2015-07-10 HISTORY — DX: Cervicalgia: M54.2

## 2015-07-10 HISTORY — DX: Headache: R51

## 2015-07-10 HISTORY — DX: Headache, unspecified: R51.9

## 2015-07-10 HISTORY — DX: Other chronic pain: G89.29

## 2015-07-10 LAB — URINE MICROSCOPIC-ADD ON

## 2015-07-10 LAB — URINALYSIS, ROUTINE W REFLEX MICROSCOPIC
BILIRUBIN URINE: NEGATIVE
GLUCOSE, UA: NEGATIVE mg/dL
KETONES UR: NEGATIVE mg/dL
Leukocytes, UA: NEGATIVE
NITRITE: NEGATIVE
PH: 5 (ref 5.0–8.0)
Protein, ur: NEGATIVE mg/dL
SPECIFIC GRAVITY, URINE: 1.017 (ref 1.005–1.030)
Urobilinogen, UA: 0.2 mg/dL (ref 0.0–1.0)

## 2015-07-10 LAB — COMPREHENSIVE METABOLIC PANEL
ALK PHOS: 53 U/L (ref 38–126)
ALT: 19 U/L (ref 14–54)
AST: 22 U/L (ref 15–41)
Albumin: 4.4 g/dL (ref 3.5–5.0)
Anion gap: 10 (ref 5–15)
BUN: 17 mg/dL (ref 6–20)
CO2: 24 mmol/L (ref 22–32)
Calcium: 9.4 mg/dL (ref 8.9–10.3)
Chloride: 105 mmol/L (ref 101–111)
Creatinine, Ser: 0.49 mg/dL (ref 0.44–1.00)
GFR calc Af Amer: >60 mL/min (ref >60)
GLUCOSE: 110 mg/dL — AB (ref 65–99)
POTASSIUM: 3.9 mmol/L (ref 3.5–5.1)
SODIUM: 139 mmol/L (ref 135–145)
Total Bilirubin: 0.7 mg/dL (ref 0.3–1.2)
Total Protein: 7.8 g/dL (ref 6.5–8.1)

## 2015-07-10 LAB — LIPASE, BLOOD: LIPASE: 29 U/L (ref 22–51)

## 2015-07-10 LAB — CBC
HEMATOCRIT: 38.7 % (ref 36.0–46.0)
HEMOGLOBIN: 13 g/dL (ref 12.0–15.0)
MCH: 31.2 pg (ref 26.0–34.0)
MCHC: 33.6 g/dL (ref 30.0–36.0)
MCV: 92.8 fL (ref 78.0–100.0)
Platelets: 158 10*3/uL (ref 150–400)
RBC: 4.17 MIL/uL (ref 3.87–5.11)
RDW: 12.2 % (ref 11.5–15.5)
WBC: 10 10*3/uL (ref 4.0–10.5)

## 2015-07-10 MED ORDER — ONDANSETRON HCL 4 MG/2ML IJ SOLN
4.0000 mg | INTRAMUSCULAR | Status: DC | PRN
  Administered 2015-07-10: 4 mg via INTRAVENOUS
  Filled 2015-07-10: qty 2

## 2015-07-10 MED ORDER — IOHEXOL 300 MG/ML  SOLN
25.0000 mL | Freq: Once | INTRAMUSCULAR | Status: AC | PRN
  Administered 2015-07-10: 25 mL via ORAL

## 2015-07-10 MED ORDER — IOHEXOL 300 MG/ML  SOLN
100.0000 mL | Freq: Once | INTRAMUSCULAR | Status: AC | PRN
  Administered 2015-07-10: 100 mL via INTRAVENOUS

## 2015-07-10 MED ORDER — DICYCLOMINE HCL 20 MG PO TABS: 20.0000 mg | ORAL_TABLET | Freq: Four times a day (QID) | ORAL | Status: DC | PRN

## 2015-07-10 MED ORDER — DICYCLOMINE HCL 10 MG/ML IM SOLN
20.0000 mg | Freq: Once | INTRAMUSCULAR | Status: AC
  Administered 2015-07-10: 20 mg via INTRAMUSCULAR
  Filled 2015-07-10: qty 2

## 2015-07-10 MED ORDER — FENTANYL CITRATE (PF) 100 MCG/2ML IJ SOLN
50.0000 ug | INTRAMUSCULAR | Status: DC | PRN
  Administered 2015-07-10: 50 ug via INTRAVENOUS
  Filled 2015-07-10: qty 2

## 2015-07-10 MED ORDER — SODIUM CHLORIDE 0.9 % IV SOLN
INTRAVENOUS | Status: DC
  Administered 2015-07-10: 22:00:00 via INTRAVENOUS

## 2015-07-10 NOTE — Discharge Instructions (Signed)
°Emergency Department Resource Guide °1) Find a Doctor and Pay Out of Pocket °Although you won't have to find out who is covered by your insurance plan, it is a good idea to ask around and get recommendations. You will then need to call the office and see if the doctor you have chosen will accept you as a new patient and what types of options they offer for patients who are self-pay. Some doctors offer discounts or will set up payment plans for their patients who do not have insurance, but you will need to ask so you aren't surprised when you get to your appointment. ° °2) Contact Your Local Health Department °Not all health departments have doctors that can see patients for sick visits, but many do, so it is worth a call to see if yours does. If you don't know where your local health department is, you can check in your phone book. The CDC also has a tool to help you locate your state's health department, and many state websites also have listings of all of their local health departments. ° °3) Find a Walk-in Clinic °If your illness is not likely to be very severe or complicated, you may want to try a walk in clinic. These are popping up all over the country in pharmacies, drugstores, and shopping centers. They're usually staffed by nurse practitioners or physician assistants that have been trained to treat common illnesses and complaints. They're usually fairly quick and inexpensive. However, if you have serious medical issues or chronic medical problems, these are probably not your best option. ° °No Primary Care Doctor: °- Call [facility] at  832-8000 - they can help you locate a primary care doctor that  accepts your insurance, provides certain services, etc. °- Physician Referral Service- 1-800-533-3463 ° °Chronic Pain Problems: °Organization         Address  Phone   Notes  °[facility]  (336) 297-2271 Patients need to be referred by their primary care doctor.  ° °Medication  Assistance: °Organization         Address  Phone   Notes  °[place] [facility] [address] °[place], [place] [address] (336) 641-8030 --Must be a resident of [place] °-- Must have NO insurance coverage whatsoever (no Medicaid/ Medicare, etc.) °-- The pt. MUST have a primary care doctor that directs their care regularly and follows them in the community °  °[facility]  (866) 331-1348   °[employer]  (888) 892-1162   ° °Agencies that provide inexpensive medical care: °Organization         Address  Phone   Notes  °[facility]  (336) 832-8035   °[facility]    (336) 832-7272   °[facility] [address] °[place], [place] [address] (336) 832-4777   °[facility] [address], °[place] (336) 271-4999   °[facility]    (336) 373-0678   °[facility]    (336) 272-1050   °[facility] ° [address], [place] Phone:  (336) 832-4444, Fax:  (336) 832-4440 Hours of Operation:  9 am - 6 pm, M-F.  Also accepts Medicaid/Medicare and self-pay.  °[facility] ° [address], [place] Phone: (336) [phone], Fax: (336) [phone]. Hours of Operation:  8:30 am - 5:30 pm, M-F.  Also accepts Medicaid and self-pay.  °[facility] [place] [address], [place] Phone: (336) 878-6027   °[facility] [address], [place], [place] (336)723-1848, Ext. 123 Mondays & Thursdays: 7-9 AM.  First 15 patients are seen on a first come, first serve basis. °  ° °Medicaid-accepting [place] Providers: ° °Organization         Address  Phone   Notes  °[facility] [address], [place] (336) 641-2100 Also accepts self-pay patients.  °[facility] [address], [place] ° (336) 856-9996   °[facility] [address], [place]  (336) 288-8857   °[facility] [address] [place] [address], [place] (336) 299-7000   °Veita Bland [address], [place]  ° (336) 373-1557 Only accepts [place] Access Medicaid patients after they have their name applied to their card.  ° °Self-Pay (no insurance) in [place]: ° °Organization         Address  Phone   Notes  °Sickle Cell Patients, [facility] [address], [place] (336) 832-1970   °[facility] [address], [place] (336) 832-4400   °[facility] ° [address], [place] (336) 992-4800   °[facility]/Dr. Osei-Bonsu ° [address] [place] [address], [place] or [address], [place] (336) 841-8500 Phone number for both [place] and [place] locations is the same.  °[facility] [address], [place] (336) 299-0000   °[facility] [address] [place] [address], [place] or [address] (336) 852-7530 °(336) 878-2260   °[facility] [address], [place] (336) 350-1642, phone; (336) 294-5005, fax Sees patients 1st and 3rd Saturday of every month.  Must not qualify for public or private insurance (i.e. Medicaid, Medicare, [place] Health Choice, Veterans' Benefits) • Household income should be no more than 200% of the poverty level •The clinic cannot treat you if you are pregnant or think you are pregnant • Sexually transmitted diseases are not treated at the clinic.  ° ° °Dental Care: °Organization         Address  Phone  Notes  °[place] [facility] [address], [place] (336) 641-6152 Accepts children up to age 21 who are enrolled in Medicaid or [place] Health Choice; pregnant women with a Medicaid card; and children who have applied for Medicaid or [place] Health Choice, but were declined, whose parents can pay a reduced fee at time of service.  °[place]  [facility] [place]  [address], [place] (336) 641-7733 Accepts children up to age 21 who are enrolled in Medicaid or [place] Health Choice; pregnant women with a Medicaid card; and children who have applied for Medicaid or [place] Health Choice, but were declined, whose parents can pay a reduced fee at time of service.  °[facility] ° [address], [place] (336) 641-4533 Patients are seen by appointment only. Walk-ins are not accepted. [facility] will see patients 18 years of age and older. °Monday - Tuesday (8am-5pm) °Most Wednesdays (8:30-5pm) °$30 per visit, cash only  °[facility] ° [address], [place] (336) 641-4533 Patients are seen by appointment only. Walk-ins are not accepted. [facility] will see patients 18 years of age and older. °One   Wednesday Evening (Monthly: Volunteer Based).  $30 per visit, cash only  °[facility]  (919) 537-3737 for adults; Children under age 4, call Graduate Pediatric Dentistry at ([phone]) [phone]. Children aged 4-14, please call ([phone]) [phone] to request a pediatric application. ° Dental services are provided in all areas of dental care including fillings, crowns and bridges, complete and partial dentures, implants, gum treatment, root canals, and extractions. Preventive care is also provided. Treatment is provided to both adults and children. °Patients are selected via a lottery and there is often a waiting list. °  °[facility] [address], °[place] ° (336) 763-8833 [email or web address] °  °[facility] [address], [place], [place] (336)723-1848, Ext. 123 Second and Fourth Thursday of each month, opens at 6:30 AM; Clinic ends at 9 AM.  Patients are seen on a first-come first-served basis, and a limited number are seen during each clinic.  ° °[facility] ° [address], [place], [place] (336) 723-7904    Eligibility Requirements °You must have lived in [place], [place], or [place] counties for at least the last three months. °  You cannot be eligible for state or federal sponsored healthcare insurance, including Veterans Administration, Medicaid, or Medicare. °  You generally cannot be eligible for healthcare insurance through your employer.  °  How to apply: °Eligibility screenings are held every Tuesday and Wednesday afternoon from 1:00 pm until 4:00 pm. You do not need an appointment for the interview!  °[facility] [address], [place], [place] 336-631-2330   °[facility]  336-342-8273   °[facility]  336-703-3100   °[facility]  336-570-6415   ° °Behavioral Health Resources in the Community: °Intensive Outpatient Programs °Organization         Address  Phone  Notes  °[facility] [address], [place], [place] 336-878-6098   °[facility] [address], [place], [place] 336-832-9800   °[facility] [address], [place], [place] ° 336-882-2125   °[facility] [address],  °[place], [place] 1-800-853-5163 or 336-641-4981   °Substance Abuse Resources °Organization         Address  Phone  Notes  °[facility]  336-882-2125   °[facility]  336-784-9470   °[facility]  336-285-9073   °[facility]  336-845-3988   °Residential & Outpatient Substance Abuse Program  1-800-659-3381   °Psychological Services °Organization         Address  Phone  Notes  °[facility]  336- 832-9600   °[facility]  336- 378-7881   °[facility] [address], [place] 1-800-853-5163 or 336-641-4981   ° °Mobile Crisis Teams °Organization         Address  Phone  Notes  °[facility], Mobile Crisis Care Unit  1-877-626-1772   °Assertive °[facility] ° [address]  [place], [place] 336-834-9664   °Sharon DeEsch [address] °[place] 336-554-5454   ° °Self-Help/Support Groups °Organization         Address  Phone             Notes  °[facility]  - variety of support groups  336- 373-1402 Call for more information  °Narcotics Anonymous (NA), [facility] [address], °[place]  2 meetings at this location  ° °  Residential Treatment Programs Organization         Address  Phone  Notes  [facility] [address],    [place]  1-308-657-8469   [facility]  [address], [place] [address], [place], [place] 413-244-0102   [facility] [address] [place], [place] 725-366-4403 Admissions: 8am-3pm M-F  [facility] [address]    [place], [place] 474-259-5638   [facility] [address] [place], [place], [place] 756-433-2951   [facility] [address],  [place], [place] 884-166-0630   [facility] - Intensive Outpatient [address], [place], [place] 160-109-3235   [facility] ([facility] [address] [place]  [place], [place] 5-732-202-5427 or 514-654-9771   [facility] ([facility] [address], [place], [place] 517-616-0737 Accepts Medicaid  [facility] [address],  [place] 1-062-694-8546 Substance Abuse/Addiction Treatment   [facility] Organization         Address  Phone  Notes  [facility]  706-433-9778   Angie Fava, PhD [address] [place], [place]   518-804-7562 or 667-502-1222   [facility]   [address] [place], [place] [phone]   [facility] [address], [place], [place] 519-256-4310 Insurance/Medicaid/sponsorship through [facility] Families [address]                                    [place], [place] [phone] Therapy/tele-psych/case    [facility] [address] [place] [address], [place] [phone]    Dr. Lolly Mustache  707-395-9373   [facility]  [employer] [facility] Dept. 1) [address] [place] 2) [address], [place] 3)  [address], [place] 858-615-3716 (743) [phone]  762-126-4435   [facility] Child Abuse Hotline 838-683-7827 or 8734590008 (After Hours)      Take the prescription as directed. Try to increase the fiber in your diet.  Call your regular medical doctor and your GI doctor on Monday to schedule a follow up appointment within the next week.  Return to the Emergency Department immediately if worsening.

## 2015-07-10 NOTE — ED Provider Notes (Signed)
CSN: [id number]     Arrival date & time 07/10/15  2047 History   First MD Initiated Contact with Patient 07/10/15 2124     Chief Complaint  Patient presents with  . Abdominal Pain  . Fever     HPI  Pt was seen at 2155.  Per pt, c/o gradual onset and persistence of intermittent generalized abd "pain" for the past several years, worse over the past several months.  Has been associated with nausea and constipation. Symptoms worsen after eating. Describes the abd pain as "feeling full." States she has been evaluated by her PMD and GI MD multiple times for same, dx IBS and "told there was sludge in my gallbladder." States she is due to see a Surgeon about her gallbladder on 08/05/15. States she had a home temp to "101.4" this evening and took tylenol at 1900. Pt states she completed abx for a UTI last week. Denies vomiting/diarrhea, no back pain, no rash, no CP/SOB, no black or blood in stools.      Past Medical History  Diagnosis Date  . Endometriosis   . Colon polyps 2010  . Urinary tract infection   . Hemochromatosis, hereditary   . Allergy     SEASONAL  . IBS (irritable bowel syndrome)   . Headache   . Chronic neck pain    Past Surgical History  Procedure Laterality Date  . Abdominal hysterectomy  partial   . Laparoscopic endometriosis fulguration    . Shoulder surgery  2015    right    Family History  Problem Relation Age of Onset  . Adopted: Yes   History  Substance Use Topics  . Smoking status: Never Smoker   . Smokeless tobacco: Never Used  . Alcohol Use: No    Review of Systems ROS: Statement: All systems negative except as marked or noted in the HPI; Constitutional: +fever and chills. ; ; Eyes: Negative for eye pain, redness and discharge. ; ; ENMT: Negative for ear pain, hoarseness, nasal congestion, sinus pressure and sore throat. ; ; Cardiovascular: Negative for chest pain, palpitations, diaphoresis, dyspnea and peripheral edema. ; ; Respiratory: Negative for cough,  wheezing and stridor. ; ; Gastrointestinal: +abd pain, constipation, nausea. Negative for vomiting, diarrhea, blood in stool, hematemesis, jaundice and rectal bleeding. . ; ; Genitourinary: Negative for dysuria, flank pain and hematuria. ; ; Musculoskeletal: Negative for back pain and neck pain. Negative for swelling and trauma.; ; Skin: Negative for pruritus, rash, abrasions, blisters, bruising and skin lesion.; ; Neuro: Negative for headache, lightheadedness and neck stiffness. Negative for weakness, altered level of consciousness , altered mental status, extremity weakness, paresthesias, involuntary movement, seizure and syncope.      Allergies  Oxycodone; Penicillins; Prednisone; Valacyclovir hcl; and Sulfa antibiotics  Home Medications   Prior to Admission medications   Medication Sig Start Date End Date Taking? Authorizing Provider  cyclobenzaprine (FLEXERIL) 5 MG tablet Take 1-2 tablets at bedtime as needed for neck pain Patient taking differently: Take 2.5 mg by mouth at bedtime.  06/09/15  Yes Emi Belfast, FNP  dicyclomine (BENTYL) 20 MG tablet Take 1 tablet (20 mg total) by mouth 4 (four) times daily -  before meals and at bedtime. Patient taking differently: Take 20 mg by mouth 3 (three) times daily as needed for spasms.  05/11/15  Yes Tonye Pearson, MD  DIGESTIVE ENZYMES PO Take 500 mg by mouth daily as needed (digestion).    Yes Historical Provider, MD  fluocinonide cream (LIDEX)  0.05 % Apply 1 application topically 2 (two) times daily as needed (pain).  04/12/15  Yes Historical Provider, MD  meloxicam (MOBIC) 15 MG tablet Take 1 tablet (15 mg total) by mouth daily. 06/09/15  Yes Emi Belfast, FNP  naproxen (NAPROSYN) 500 MG tablet Take 250 mg by mouth 2 (two) times daily as needed for mild pain or moderate pain.  02/20/15  Yes Historical Provider, MD  Probiotic CAPS Take 1 capsule by mouth daily as needed (upset stomach).   Yes Historical Provider, MD  ranitidine (ZANTAC) 150  MG tablet Take 150 mg by mouth 2 (two) times daily as needed for heartburn.   Yes Historical Provider, MD  dextromethorphan-guaiFENesin (MUCINEX DM) 30-600 MG per 12 hr tablet Take 1 tablet by mouth 2 (two) times daily as needed for cough.    Historical Provider, MD  docusate sodium (COLACE) 100 MG capsule Take 100 mg by mouth 2 (two) times daily as needed for mild constipation or moderate constipation.     Historical Provider, MD  loratadine (CLARITIN) 10 MG tablet Take 10 mg by mouth daily.    Historical Provider, MD  nitrofurantoin, macrocrystal-monohydrate, (MACROBID) 100 MG capsule Take 1 capsule (100 mg total) by mouth 2 (two) times daily. Patient not taking: Reported on 07/10/2015 06/24/15   Tonye Pearson, MD   BP 106/68 mmHg  Pulse 101  Temp(Src) 98.3 F (36.8 C) (Oral)  Resp 20  Ht 5\' 1"  (1.549 m)  Wt 126 lb (57.153 kg)  BMI 23.82 kg/m2  SpO2 95%  Filed Vitals:   07/10/15 2111 07/10/15 2336  BP: 106/68 126/90  Pulse: 101 79  Temp: 98.3 F (36.8 C) 98.5 F (36.9 C)  TempSrc: Oral Oral  Resp: 20 16  Height: 5\' 1"  (1.549 m)   Weight: 126 lb (57.153 kg)   SpO2: 95% 98%    Physical Exam  2200: Physical examination:  Nursing notes reviewed; Vital signs and O2 SAT reviewed; Afebrile;; Constitutional: Well developed, Well nourished, Well hydrated, In no acute distress; Head:  Normocephalic, atraumatic; Eyes: EOMI, PERRL, No scleral icterus; ENMT: Mouth and pharynx normal, Mucous membranes moist; Neck: Supple, Full range of motion, No lymphadenopathy; Cardiovascular: Regular rate and rhythm, No murmur, rub, or gallop; Respiratory: Breath sounds clear & equal bilaterally, No rales, rhonchi, wheezes.  Speaking full sentences with ease, Normal respiratory effort/excursion; Chest: Nontender, Movement normal; Abdomen: Soft, +diffuse tenderness to palp. No rebound or guarding. Nondistended, Normal bowel sounds; Genitourinary: No CVA tenderness; Extremities: Pulses normal, No tenderness,  No edema, No calf edema or asymmetry.; Neuro: AA&Ox3, Major CN grossly intact.  Speech clear. No gross focal motor or sensory deficits in extremities.; Skin: Color normal, Warm, Dry.   ED Course  Procedures     EKG Interpretation None      MDM  MDM Reviewed: previous chart, nursing note and vitals Reviewed previous: labs and ultrasound Interpretation: labs and CT scan      Results for orders placed or performed during the hospital encounter of 07/10/15  Lipase, blood  Result Value Ref Range   Lipase 29 22 - 51 U/L  Comprehensive metabolic panel  Result Value Ref Range   Sodium 139 135 - 145 mmol/L   Potassium 3.9 3.5 - 5.1 mmol/L   Chloride 105 101 - 111 mmol/L   CO2 24 22 - 32 mmol/L   Glucose, Bld 110 (H) 65 - 99 mg/dL   BUN 17 6 - 20 mg/dL   Creatinine, Ser 4.09 0.44 - 1.00  mg/dL   Calcium 9.4 8.9 - 11.9 mg/dL   Total Protein 7.8 6.5 - 8.1 g/dL   Albumin 4.4 3.5 - 5.0 g/dL   AST 22 15 - 41 U/L   ALT 19 14 - 54 U/L   Alkaline Phosphatase 53 38 - 126 U/L   Total Bilirubin 0.7 0.3 - 1.2 mg/dL   GFR calc non Af Amer >60 >60 mL/min   GFR calc Af Amer >60 >60 mL/min   Anion gap 10 5 - 15  CBC  Result Value Ref Range   WBC 10.0 4.0 - 10.5 K/uL   RBC 4.17 3.87 - 5.11 MIL/uL   Hemoglobin 13.0 12.0 - 15.0 g/dL   HCT 14.7 82.9 - 56.2 %   MCV 92.8 78.0 - 100.0 fL   MCH 31.2 26.0 - 34.0 pg   MCHC 33.6 30.0 - 36.0 g/dL   RDW 13.0 86.5 - 78.4 %   Platelets 158 150 - 400 K/uL  Urinalysis, Routine w reflex microscopic (not at [facility])  Result Value Ref Range   Color, Urine YELLOW YELLOW   APPearance CLEAR CLEAR   Specific Gravity, Urine 1.017 1.005 - 1.030   pH 5.0 5.0 - 8.0   Glucose, UA NEGATIVE NEGATIVE mg/dL   Hgb urine dipstick SMALL (A) NEGATIVE   Bilirubin Urine NEGATIVE NEGATIVE   Ketones, ur NEGATIVE NEGATIVE mg/dL   Protein, ur NEGATIVE NEGATIVE mg/dL   Urobilinogen, UA 0.2 0.0 - 1.0 mg/dL   Nitrite NEGATIVE NEGATIVE   Leukocytes, UA NEGATIVE NEGATIVE   Urine microscopic-add on  Result Value Ref Range   Squamous Epithelial / LPF RARE RARE   RBC / HPF 3-6 <3 RBC/hpf   US Abdomen Complete 06/18/2015   CLINICAL DATA:  Right upper quadrant pain .  EXAM: ULTRASOUND ABDOMEN COMPLETE  COMPARISON:  None.  FINDINGS: Gallbladder: Mild sludge. 4.3 mm polyp. No gallstones noted. Gallbladder wall thickness normal. Negative Murphy sign.  Common bile duct: Diameter: 1.5 mm  Liver: Slightly heterogeneous liver echotexture suggesting fatty infiltration and/or hepatocellular disease. No focal lesion.  IVC: No abnormality visualized.  Pancreas: Visualized portion unremarkable.  Spleen: Size and appearance within normal limits.  Right Kidney: Length: 10.9 cm. Echogenicity within normal limits. No mass or hydronephrosis visualized. Extrarenal pelvis.  Left Kidney: Length: 11 1 cm. Echogenicity within normal limits. No mass or hydronephrosis visualized.  Abdominal aorta: No aneurysm visualized.  Other findings: None.  IMPRESSION: 1.  Mild gallbladder sludge.  4.3 mm gallbladder polyp.  2. Slightly heterogeneous liver echotexture suggesting fatty infiltration and/or hepatocellular disease.   Electronically Signed   By: Maisie Fus  Register   On: 06/18/2015 09:11    Ct Abdomen Pelvis W Contrast 07/10/2015   CLINICAL DATA:  Intermittent abdominal pain for several weeks. Bilateral groin soreness. History of gallbladder sludge, pending surgical appointment. History of irritable bowel syndrome, colonic polyps, urinary tract infection, endometriosis.  EXAM: CT ABDOMEN AND PELVIS WITH CONTRAST  TECHNIQUE: Multidetector CT imaging of the abdomen and pelvis was performed using the standard protocol following bolus administration of intravenous contrast.  CONTRAST:  OMNIPAQUE IOHEXOL 300 MG/ML SOLN, 25mL OMNIPAQUE IOHEXOL 300 MG/ML SOLN  COMPARISON:  Abdominal ultrasound June 18, 2015  FINDINGS: LUNG BASES: Included view of the lung bases are clear. Visualized heart and pericardium are  unremarkable.  SOLID ORGANS: The liver, spleen, gallbladder, pancreas and adrenal glands are unremarkable.  GASTROINTESTINAL TRACT: The stomach, small and large bowel are normal in course and caliber without inflammatory changes. Enteric contrast has  not yet reached the distal small bowel. The appendix is not discretely identified, however there are no inflammatory changes in the right lower quadrant. Moderate amount of retained large bowel stool.  KIDNEYS/ URINARY TRACT: Kidneys are orthotopic, demonstrating symmetric enhancement. No nephrolithiasis, hydronephrosis or solid renal masses. Small apparent RIGHT parapelvic cyst. The unopacified ureters are normal in course and caliber. Delayed imaging through the kidneys demonstrates symmetric prompt contrast excretion within the proximal urinary collecting system. Urinary bladder is partially distended and unremarkable.  PERITONEUM/RETROPERITONEUM: Aortoiliac vessels are normal in course and caliber, trace calcific atherosclerosis. No lymphadenopathy by CT size criteria. Status post hysterectomy. No intraperitoneal free fluid nor free air.  SOFT TISSUE/OSSEOUS STRUCTURES: Non-suspicious. Small inguinal lymph nodes. No inguinal hernia is in identified on static CT.  IMPRESSION: Moderate amount of retained large bowel stool without bowel obstruction or acute intra-abdominal/ pelvic process.   Electronically Signed   By: Awilda Metro M.D.   On: 07/10/2015 22:53    Dg Chest 2 View 07/10/2015   CLINICAL DATA:  Acute onset of generalized abdominal pain and fever. Initial encounter.  EXAM: CHEST  2 VIEW  COMPARISON:  None.  FINDINGS: The lungs are well-aerated and clear. There is no evidence of focal opacification, pleural effusion or pneumothorax. Bilateral nipple shadows are seen.  The heart is normal in size; the mediastinal contour is within normal limits. No acute osseous abnormalities are seen.  IMPRESSION: No acute cardiopulmonary process seen.   Electronically  Signed   By: Roanna Raider M.D.   On: 07/10/2015 23:24    2345:  Pt has tol PO well while in the ED without N/V.  No stooling while in the ED.  Abd benign, VSS. Feels better and wants to go home now. Dx and testing d/w pt and family.  Questions answered.  Verb understanding, agreeable to d/c home with outpt f/u.    Samuel Jester, DO 07/14/15 [phone]

## 2015-07-10 NOTE — ED Notes (Addendum)
Pt has had intermittent abdominal pain for several weeks and also bilateral groin pain described as "soreness." Has appointment with surgeon September 1st-had [place] recently and was told "I had sludge in my gall bladder." Says pain is "sometimes worse when I eat." Describes feeling "full" and "miserable." Also having recent issues with constipation. Tmax today at home was 101.4. Given Tylenol at 1900 tonight. Endorses intermittent nausea but no vomiting. Also finished a round of abx one week ago for UTI-this is the second recurring UTI in the past few weeks. Right sided abdominal pain intermittently radiates to right flank. No active vomiting in triage. In NAD. No other c/c.

## 2015-09-20 ENCOUNTER — Telehealth: Payer: Self-pay | Admitting: Internal Medicine

## 2015-09-20 NOTE — Telephone Encounter (Signed)
returned call adn s.w. pt and r/s appt to the first of the year due to insurance....pt ok and aware of new d.t

## 2015-09-21 ENCOUNTER — Other Ambulatory Visit: Payer: BLUE CROSS/BLUE SHIELD

## 2015-09-26 ENCOUNTER — Telehealth: Payer: Self-pay | Admitting: Family Medicine

## 2015-09-27 NOTE — Telephone Encounter (Signed)
Patient cancelled appt that she had with Leone PayorGessner on 10/25/15

## 2015-09-28 ENCOUNTER — Ambulatory Visit: Payer: BLUE CROSS/BLUE SHIELD | Admitting: Internal Medicine

## 2015-10-25 ENCOUNTER — Encounter: Payer: Self-pay | Admitting: Family Medicine

## 2015-11-01 ENCOUNTER — Encounter: Payer: Self-pay | Admitting: Internal Medicine

## 2015-12-12 ENCOUNTER — Telehealth: Payer: Self-pay | Admitting: Internal Medicine

## 2015-12-12 NOTE — Telephone Encounter (Signed)
DUE CALL MOVED 1/11 LAB AND 1/18 F/U TO 1/24 AND 1/31. SPOKE WITH PATIENT SHE IS AWARE AND HAS NEW DATE/TIMES.

## 2015-12-15 ENCOUNTER — Other Ambulatory Visit: Payer: BLUE CROSS/BLUE SHIELD

## 2015-12-22 ENCOUNTER — Ambulatory Visit: Payer: BLUE CROSS/BLUE SHIELD | Admitting: Internal Medicine

## 2015-12-28 ENCOUNTER — Other Ambulatory Visit: Payer: Self-pay | Admitting: Medical Oncology

## 2015-12-28 ENCOUNTER — Other Ambulatory Visit (HOSPITAL_BASED_OUTPATIENT_CLINIC_OR_DEPARTMENT_OTHER): Payer: BLUE CROSS/BLUE SHIELD

## 2015-12-28 LAB — COMPREHENSIVE METABOLIC PANEL
ALBUMIN: 3.8 g/dL (ref 3.5–5.0)
ALK PHOS: 54 U/L (ref 40–150)
ALT: 13 U/L (ref 0–55)
AST: 15 U/L (ref 5–34)
Anion Gap: 7 mEq/L (ref 3–11)
BUN: 10.4 mg/dL (ref 7.0–26.0)
CALCIUM: 8.9 mg/dL (ref 8.4–10.4)
CHLORIDE: 107 meq/L (ref 98–109)
CO2: 27 mEq/L (ref 22–29)
Creatinine: 0.7 mg/dL (ref 0.6–1.1)
EGFR: >90 mL/min/{1.73_m2} (ref >90)
Glucose: 113 mg/dl (ref 70–140)
Potassium: 4.1 mEq/L (ref 3.5–5.1)
Sodium: 141 mEq/L (ref 136–145)
Total Bilirubin: <0.3 mg/dL (ref 0.20–1.20)
Total Protein: 7.2 g/dL (ref 6.4–8.3)

## 2015-12-28 LAB — CBC WITH DIFFERENTIAL/PLATELET
BASO%: 0.4 % (ref 0.0–2.0)
Basophils Absolute: 0 10*3/uL (ref 0.0–0.1)
EOS%: 1.7 % (ref 0.0–7.0)
Eosinophils Absolute: 0.1 10*3/uL (ref 0.0–0.5)
HEMATOCRIT: 38.7 % (ref 34.8–46.6)
HEMOGLOBIN: 12.9 g/dL (ref 11.6–15.9)
LYMPH#: 1.5 10*3/uL (ref 0.9–3.3)
LYMPH%: 34.6 % (ref 14.0–49.7)
MCH: 31.8 pg (ref 25.1–34.0)
MCHC: 33.3 g/dL (ref 31.5–36.0)
MCV: 95.5 fL (ref 79.5–101.0)
MONO#: 0.4 10*3/uL (ref 0.1–0.9)
MONO%: 9.7 % (ref 0.0–14.0)
NEUT%: 53.6 % (ref 38.4–76.8)
NEUTROS ABS: 2.3 10*3/uL (ref 1.5–6.5)
Platelets: 176 10*3/uL (ref 145–400)
RBC: 4.06 10*6/uL (ref 3.70–5.45)
RDW: 12.6 % (ref 11.2–14.5)
WBC: 4.2 10*3/uL (ref 3.9–10.3)

## 2015-12-29 LAB — IRON AND TIBC
%SAT: 39 % (ref 21–57)
Iron: 88 ug/dL (ref 41–142)
TIBC: 225 ug/dL — AB (ref 236–444)
UIBC: 137 ug/dL (ref 120–384)

## 2015-12-29 LAB — FERRITIN: Ferritin: 36 ng/ml (ref 9–269)

## 2016-01-04 ENCOUNTER — Ambulatory Visit (HOSPITAL_BASED_OUTPATIENT_CLINIC_OR_DEPARTMENT_OTHER): Payer: BLUE CROSS/BLUE SHIELD | Admitting: Internal Medicine

## 2016-01-04 ENCOUNTER — Encounter: Payer: Self-pay | Admitting: Internal Medicine

## 2016-01-04 DIAGNOSIS — M2550 Pain in unspecified joint: Secondary | ICD-10-CM

## 2016-01-04 NOTE — Addendum Note (Signed)
Addended by: Charma Igo on: 01/04/2016 03:55 PM   Modules accepted: Orders, Medications

## 2016-01-04 NOTE — Progress Notes (Signed)
[facility] [phone]   [phone]  OFFICE PROGRESS NOTE  Tonye Pearson, MD [address] [place] [address]  DIAGNOSIS: Hereditary hemochromatosis with homozygous C282Y mutation.   PRIOR THERAPY: Weekly phlebotomies x3, last one was in January of 2014.   CURRENT THERAPY: Observation.  INTERVAL HISTORY: Ruth Harrison 50 y.o. female returns to the clinic today for followup visit. The patient has no complaints today except for persistent arthralgia and mild fatigue. The patient denied having any significant weight loss or night sweats. She denied having any chest pain, shortness of breath, cough or hemoptysis. She has no fever or chills, no nausea or vomiting. She had repeat CBC, comprehensive metabolic panel, iron study and ferritin performed recently and she is here for evaluation and discussion of her lab results.  MEDICAL HISTORY: Past Medical History  Diagnosis Date  . Endometriosis   . Colon polyps 2010  . Urinary tract infection   . Hemochromatosis, hereditary (HCC)   . Allergy     SEASONAL  . IBS (irritable bowel syndrome)   . Headache   . Chronic neck pain     ALLERGIES:  is allergic to oxycodone; penicillins; prednisone; valacyclovir hcl; and sulfa antibiotics.  MEDICATIONS:  Current Outpatient Prescriptions  Medication Sig Dispense Refill  . cyclobenzaprine (FLEXERIL) 5 MG tablet Take 1-2 tablets at bedtime as needed for neck pain (Patient taking differently: Take 2.5 mg by mouth at bedtime. ) 30 tablet 1  . dextromethorphan-guaiFENesin (MUCINEX DM) 30-600 MG per 12 hr tablet Take 1 tablet by mouth 2 (two) times daily as needed for cough.    . dicyclomine (BENTYL) 20 MG tablet Take 1 tablet (20 mg total) by mouth 4 (four) times daily -  before meals and at bedtime. (Patient taking differently: Take 20 mg by mouth 3 (three) times daily as needed for spasms. ) 60 tablet 1  . dicyclomine (BENTYL) 20 MG tablet Take 1  tablet (20 mg total) by mouth every 6 (six) hours as needed for spasms (abdominal cramping). 15 tablet 0  . DIGESTIVE ENZYMES PO Take 500 mg by mouth daily as needed (digestion).     Marland Kitchen docusate sodium (COLACE) 100 MG capsule Take 100 mg by mouth 2 (two) times daily as needed for mild constipation or moderate constipation.     . fluocinonide cream (LIDEX) 0.05 % Apply 1 application topically 2 (two) times daily as needed (pain).     Marland Kitchen loratadine (CLARITIN) 10 MG tablet Take 10 mg by mouth daily.    . meloxicam (MOBIC) 15 MG tablet Take 1 tablet (15 mg total) by mouth daily. 30 tablet 0  . naproxen (NAPROSYN) 500 MG tablet Take 250 mg by mouth 2 (two) times daily as needed for mild pain or moderate pain.   0  . nitrofurantoin, macrocrystal-monohydrate, (MACROBID) 100 MG capsule Take 1 capsule (100 mg total) by mouth 2 (two) times daily. (Patient not taking: Reported on 07/10/2015) 14 capsule 0  . Probiotic CAPS Take 1 capsule by mouth daily as needed (upset stomach).    . ranitidine (ZANTAC) 150 MG tablet Take 150 mg by mouth 2 (two) times daily as needed for heartburn.     No current facility-administered medications for this visit.    SURGICAL HISTORY:  Past Surgical History  Procedure Laterality Date  . Abdominal hysterectomy  partial   . Laparoscopic endometriosis fulguration    . Shoulder surgery  2015    right  REVIEW OF SYSTEMS:  A comprehensive review of systems was negative except for: Musculoskeletal: positive for arthralgias   PHYSICAL EXAMINATION: General appearance: alert, cooperative and no distress Head: Normocephalic, without obvious abnormality, atraumatic Neck: no adenopathy Lymph nodes: Cervical, supraclavicular, and axillary nodes normal. Resp: clear to auscultation bilaterally Cardio: regular rate and rhythm, S1, S2 normal, no murmur, click, rub or gallop GI: soft, non-tender; bowel sounds normal; no masses,  no organomegaly Extremities: extremities normal,  atraumatic, no cyanosis or edema  ECOG PERFORMANCE STATUS: 1 - Symptomatic but completely ambulatory  Blood pressure 106/69, pulse 64, temperature 97.7 F (36.5 C), resp. rate 18, height  (1.549 m), weight 126 lb 6.4 oz (57.335 kg), SpO2 100 %.  LABORATORY DATA: Lab Results  Component Value Date   WBC 4.2 12/28/2015   HGB 12.9 12/28/2015   HCT 38.7 12/28/2015   MCV 95.5 12/28/2015   PLT 176 12/28/2015      Chemistry      Component Value Date/Time   NA 141 12/28/2015 1546   NA 139 07/10/2015 2145   K 4.1 12/28/2015 1546   K 3.9 07/10/2015 2145   CL 105 07/10/2015 2145   CL 103 08/15/2012 1600   CO2 27 12/28/2015 1546   CO2 24 07/10/2015 2145   BUN 10.4 12/28/2015 1546   BUN 17 07/10/2015 2145   CREATININE 0.7 12/28/2015 1546   CREATININE 0.49 07/10/2015 2145   CREATININE 0.45* 09/29/2013 1044      Component Value Date/Time   CALCIUM 8.9 12/28/2015 1546   CALCIUM 9.4 07/10/2015 2145   ALKPHOS 54 12/28/2015 1546   ALKPHOS 53 07/10/2015 2145   AST 15 12/28/2015 1546   AST 22 07/10/2015 2145   ALT 13 12/28/2015 1546   ALT 19 07/10/2015 2145   BILITOT <0.30 12/28/2015 1546   BILITOT 0.7 07/10/2015 2145     Other lab results: Ferritin 36, serum iron 88, total iron binding capacity 225 and iron saturation 39%  RADIOGRAPHIC STUDIES: No results found.  ASSESSMENT AND PLAN: This is a very pleasant 50 years old white female with hereditary hemochromatosis with homozygous C282Y mutation. She status post phlebotomy last one was performed in January of 2014 and she has been observation since that time. The patient is feeling fine today. The recent CBC, iron study and ferritin were unremarkable. I discussed the lab result with the patient. I recommended for her to continue on observation with repeat CBC, comprehensive metabolic panel, ferritin and iron study in 6 months. She was advised to call immediately if she has any concerning symptoms in the interval.  All  questions were answered. The patient knows to call the clinic with any problems, questions or concerns. We can certainly see the patient much sooner if necessary.  Disclaimer: This note was dictated with voice recognition software. Similar sounding words can inadvertently be transcribed and may be missed upon review.

## 2016-01-06 ENCOUNTER — Ambulatory Visit (INDEPENDENT_AMBULATORY_CARE_PROVIDER_SITE_OTHER): Payer: BLUE CROSS/BLUE SHIELD | Admitting: Physician Assistant

## 2016-01-06 ENCOUNTER — Ambulatory Visit (INDEPENDENT_AMBULATORY_CARE_PROVIDER_SITE_OTHER): Payer: BLUE CROSS/BLUE SHIELD

## 2016-01-06 DIAGNOSIS — M62838 Other muscle spasm: Secondary | ICD-10-CM

## 2016-01-06 DIAGNOSIS — Z789 Other specified health status: Secondary | ICD-10-CM | POA: Diagnosis not present

## 2016-01-06 DIAGNOSIS — M6248 Contracture of muscle, other site: Secondary | ICD-10-CM

## 2016-01-06 DIAGNOSIS — J069 Acute upper respiratory infection, unspecified: Secondary | ICD-10-CM

## 2016-01-06 DIAGNOSIS — R3129 Other microscopic hematuria: Secondary | ICD-10-CM

## 2016-01-06 DIAGNOSIS — Z13220 Encounter for screening for lipoid disorders: Secondary | ICD-10-CM

## 2016-01-06 DIAGNOSIS — T148XXA Other injury of unspecified body region, initial encounter: Secondary | ICD-10-CM

## 2016-01-06 LAB — POCT CBC
Granulocyte percent: 53.9 %G (ref 37–80)
HCT, POC: 40.7 % (ref 37.7–47.9)
Hemoglobin: 14.1 g/dL (ref 12.2–16.2)
LYMPH, POC: 2 (ref 0.6–3.4)
MCH, POC: 32 pg — AB (ref 27–31.2)
MCHC: 34.7 g/dL (ref 31.8–35.4)
MCV: 92.2 fL (ref 80–97)
MID (CBC): 0.4 (ref 0–0.9)
MPV: 6.4 fL (ref 0–99.8)
PLATELET COUNT, POC: 248 10*3/uL (ref 142–424)
POC Granulocyte: 2.8 (ref 2–6.9)
POC LYMPH %: 39.3 % (ref 10–50)
POC MID %: 6.8 %M (ref 0–12)
RBC: 4.41 M/uL (ref 4.04–5.48)
RDW, POC: 13 %
WBC: 5.2 10*3/uL (ref 4.6–10.2)

## 2016-01-06 LAB — POC MICROSCOPIC URINALYSIS (UMFC): Uric Acid Crys, UA: POSITIVE

## 2016-01-06 LAB — LIPID PANEL
Cholesterol: 189 mg/dL (ref 125–200)
HDL: 61 mg/dL (ref >=46)
LDL CALC: 113 mg/dL (ref <130)
TRIGLYCERIDES: 76 mg/dL (ref <150)
Total CHOL/HDL Ratio: 3.1 Ratio (ref <=5.0)
VLDL: 15 mg/dL (ref <30)

## 2016-01-06 LAB — POCT URINALYSIS DIP (MANUAL ENTRY)
Bilirubin, UA: NEGATIVE
Glucose, UA: NEGATIVE
LEUKOCYTES UA: NEGATIVE
NITRITE UA: NEGATIVE
PROTEIN UA: NEGATIVE
Spec Grav, UA: 1.025
UROBILINOGEN UA: 0.2
pH, UA: 5.5

## 2016-01-06 MED ORDER — MELOXICAM 7.5 MG PO TABS: 7.5000 mg | ORAL_TABLET | Freq: Every day | ORAL | Status: DC

## 2016-01-06 NOTE — Patient Instructions (Addendum)
Because you received an x-ray today, you will receive an invoice from [facility]. Please contact [facility] at [phone] with questions or concerns regarding your invoice. Our billing staff will not be able to assist you with those questions. Please pick 3 of the pictures and do slow stretching.  If it says to hold the pose, hold for 10 seconds.  Do the exercises in 3 reps.  A total of 9 per day.  Please ice directly after for 15 minutes.   Drink plenty of water.   Do not take ibuprofen or naproxen with the mobic.  You can take tylenol.    Cervical Strain and Sprain With Rehab Cervical strain and sprain are injuries that commonly occur with "whiplash" injuries. Whiplash occurs when the neck is forcefully whipped backward or forward, such as during a motor vehicle accident or during contact sports. The muscles, ligaments, tendons, discs, and nerves of the neck are susceptible to injury when this occurs. RISK FACTORS Risk of having a whiplash injury increases if:  Osteoarthritis of the spine.  Situations that make head or neck accidents or trauma more likely.  High-risk sports (football, rugby, wrestling, hockey, auto racing, gymnastics, diving, contact karate, or boxing).  Poor strength and flexibility of the neck.  Previous neck injury.  Poor tackling technique.  Improperly fitted or padded equipment. SYMPTOMS   Pain or stiffness in the front or back of neck or both.  Symptoms may present immediately or up to 24 hours after injury.  Dizziness, headache, nausea, and vomiting.  Muscle spasm with soreness and stiffness in the neck.  Tenderness and swelling at the injury site. PREVENTION  Learn and use proper technique (avoid tackling with the head, spearing, and head-butting; use proper falling techniques to avoid landing on the head).  Warm up and stretch properly before activity.  Maintain physical fitness:  Strength, flexibility, and  endurance.  Cardiovascular fitness.  Wear properly fitted and padded protective equipment, such as padded soft collars, for participation in contact sports. PROGNOSIS  Recovery from cervical strain and sprain injuries is dependent on the extent of the injury. These injuries are usually curable in 1 week to 3 months with appropriate treatment.  RELATED COMPLICATIONS   Temporary numbness and weakness may occur if the nerve roots are damaged, and this may persist until the nerve has completely healed.  Chronic pain due to frequent recurrence of symptoms.  Prolonged healing, especially if activity is resumed too soon (before complete recovery). TREATMENT  Treatment initially involves the use of ice and medication to help reduce pain and inflammation. It is also important to perform strengthening and stretching exercises and modify activities that worsen symptoms so the injury does not get worse. These exercises may be performed at home or with a therapist. For patients who experience severe symptoms, a soft, padded collar may be recommended to be worn around the neck.  Improving your posture may help reduce symptoms. Posture improvement includes pulling your chin and abdomen in while sitting or standing. If you are sitting, sit in a firm chair with your buttocks against the back of the chair. While sleeping, try replacing your pillow with a small towel rolled to 2 inches in diameter, or use a cervical pillow or soft cervical collar. Poor sleeping positions delay healing.  For patients with nerve root damage, which causes numbness or weakness, the use of a cervical traction apparatus may be recommended. Surgery is rarely necessary for these injuries. However, cervical strain and sprains that are  present at birth (congenital) may require surgery. MEDICATION   If pain medication is necessary, nonsteroidal anti-inflammatory medications, such as aspirin and ibuprofen, or other minor pain relievers, such  as acetaminophen, are often recommended.  Do not take pain medication for 7 days before surgery.  Prescription pain relievers may be given if deemed necessary by your caregiver. Use only as directed and only as much as you need. HEAT AND COLD:   Cold treatment (icing) relieves pain and reduces inflammation. Cold treatment should be applied for 10 to 15 minutes every 2 to 3 hours for inflammation and pain and immediately after any activity that aggravates your symptoms. Use ice packs or an ice massage.  Heat treatment may be used prior to performing the stretching and strengthening activities prescribed by your caregiver, physical therapist, or athletic trainer. Use a heat pack or a warm soak. SEEK MEDICAL CARE IF:   Symptoms get worse or do not improve in 2 weeks despite treatment.  New, unexplained symptoms develop (drugs used in treatment may produce side effects). EXERCISES RANGE OF MOTION (ROM) AND STRETCHING EXERCISES - Cervical Strain and Sprain These exercises may help you when beginning to rehabilitate your injury. In order to successfully resolve your symptoms, you must improve your posture. These exercises are designed to help reduce the forward-head and rounded-shoulder posture which contributes to this condition. Your symptoms may resolve with or without further involvement from your physician, physical therapist or athletic trainer. While completing these exercises, remember:   Restoring tissue flexibility helps normal motion to return to the joints. This allows healthier, less painful movement and activity.  An effective stretch should be held for at least 20 seconds, although you may need to begin with shorter hold times for comfort.  A stretch should never be painful. You should only feel a gentle lengthening or release in the stretched tissue. STRETCH- Axial Extensors  Lie on your back on the floor. You may bend your knees for comfort. Place a rolled-up hand towel or dish  towel, about 2 inches in diameter, under the part of your head that makes contact with the floor.  Gently tuck your chin, as if trying to make a "double chin," until you feel a gentle stretch at the base of your head.  Hold __________ seconds. Repeat __________ times. Complete this exercise __________ times per day.  STRETCH - Axial Extension   Stand or sit on a firm surface. Assume a good posture: chest up, shoulders drawn back, abdominal muscles slightly tense, knees unlocked (if standing) and feet hip width apart.  Slowly retract your chin so your head slides back and your chin slightly lowers. Continue to look straight ahead.  You should feel a gentle stretch in the back of your head. Be certain not to feel an aggressive stretch since this can cause headaches later.  Hold for __________ seconds. Repeat __________ times. Complete this exercise __________ times per day. STRETCH - Cervical Side Bend   Stand or sit on a firm surface. Assume a good posture: chest up, shoulders drawn back, abdominal muscles slightly tense, knees unlocked (if standing) and feet hip width apart.  Without letting your nose or shoulders move, slowly tip your right / left ear to your shoulder until your feel a gentle stretch in the muscles on the opposite side of your neck.  Hold __________ seconds. Repeat __________ times. Complete this exercise __________ times per day. STRETCH - Cervical Rotators   Stand or sit on a firm surface. Assume a  good posture: chest up, shoulders drawn back, abdominal muscles slightly tense, knees unlocked (if standing) and feet hip width apart.  Keeping your eyes level with the ground, slowly turn your head until you feel a gentle stretch along the back and opposite side of your neck.  Hold __________ seconds. Repeat __________ times. Complete this exercise __________ times per day. RANGE OF MOTION - Neck Circles   Stand or sit on a firm surface. Assume a good posture: chest  up, shoulders drawn back, abdominal muscles slightly tense, knees unlocked (if standing) and feet hip width apart.  Gently roll your head down and around from the back of one shoulder to the back of the other. The motion should never be forced or painful.  Repeat the motion 10-20 times, or until you feel the neck muscles relax and loosen. Repeat __________ times. Complete the exercise __________ times per day. STRENGTHENING EXERCISES - Cervical Strain and Sprain These exercises may help you when beginning to rehabilitate your injury. They may resolve your symptoms with or without further involvement from your physician, physical therapist, or athletic trainer. While completing these exercises, remember:   Muscles can gain both the endurance and the strength needed for everyday activities through controlled exercises.  Complete these exercises as instructed by your physician, physical therapist, or athletic trainer. Progress the resistance and repetitions only as guided.  You may experience muscle soreness or fatigue, but the pain or discomfort you are trying to eliminate should never worsen during these exercises. If this pain does worsen, stop and make certain you are following the directions exactly. If the pain is still present after adjustments, discontinue the exercise until you can discuss the trouble with your clinician. STRENGTH - Cervical Flexors, Isometric  Face a wall, standing about 6 inches away. Place a small pillow, a ball about 6-8 inches in diameter, or a folded towel between your forehead and the wall.  Slightly tuck your chin and gently push your forehead into the soft object. Push only with mild to moderate intensity, building up tension gradually. Keep your jaw and forehead relaxed.  Hold 10 to 20 seconds. Keep your breathing relaxed.  Release the tension slowly. Relax your neck muscles completely before you start the next repetition. Repeat __________ times. Complete this  exercise __________ times per day. STRENGTH- Cervical Lateral Flexors, Isometric   Stand about 6 inches away from a wall. Place a small pillow, a ball about 6-8 inches in diameter, or a folded towel between the side of your head and the wall.  Slightly tuck your chin and gently tilt your head into the soft object. Push only with mild to moderate intensity, building up tension gradually. Keep your jaw and forehead relaxed.  Hold 10 to 20 seconds. Keep your breathing relaxed.  Release the tension slowly. Relax your neck muscles completely before you start the next repetition. Repeat __________ times. Complete this exercise __________ times per day. STRENGTH - Cervical Extensors, Isometric   Stand about 6 inches away from a wall. Place a small pillow, a ball about 6-8 inches in diameter, or a folded towel between the back of your head and the wall.  Slightly tuck your chin and gently tilt your head back into the soft object. Push only with mild to moderate intensity, building up tension gradually. Keep your jaw and forehead relaxed.  Hold 10 to 20 seconds. Keep your breathing relaxed.  Release the tension slowly. Relax your neck muscles completely before you start the next repetition. Repeat  __________ times. Complete this exercise __________ times per day. POSTURE AND BODY MECHANICS CONSIDERATIONS - Cervical Strain and Sprain Keeping correct posture when sitting, standing or completing your activities will reduce the stress put on different body tissues, allowing injured tissues a chance to heal and limiting painful experiences. The following are general guidelines for improved posture. Your physician or physical therapist will provide you with any instructions specific to your needs. While reading these guidelines, remember:  The exercises prescribed by your provider will help you have the flexibility and strength to maintain correct postures.  The correct posture provides the optimal  environment for your joints to work. All of your joints have less wear and tear when properly supported by a spine with good posture. This means you will experience a healthier, less painful body.  Correct posture must be practiced with all of your activities, especially prolonged sitting and standing. Correct posture is as important when doing repetitive low-stress activities (typing) as it is when doing a single heavy-load activity (lifting). PROLONGED STANDING WHILE SLIGHTLY LEANING FORWARD When completing a task that requires you to lean forward while standing in one place for a long time, place either foot up on a stationary 2- to 4-inch high object to help maintain the best posture. When both feet are on the ground, the low back tends to lose its slight inward curve. If this curve flattens (or becomes too large), then the back and your other joints will experience too much stress, fatigue more quickly, and can cause pain.  RESTING POSITIONS Consider which positions are most painful for you when choosing a resting position. If you have pain with flexion-based activities (sitting, bending, stooping, squatting), choose a position that allows you to rest in a less flexed posture. You would want to avoid curling into a fetal position on your side. If your pain worsens with extension-based activities (prolonged standing, working overhead), avoid resting in an extended position such as sleeping on your stomach. Most people will find more comfort when they rest with their spine in a more neutral position, neither too rounded nor too arched. Lying on a non-sagging bed on your side with a pillow between your knees, or on your back with a pillow under your knees will often provide some relief. Keep in mind, being in any one position for a prolonged period of time, no matter how correct your posture, can still lead to stiffness. WALKING Walk with an upright posture. Your ears, shoulders, and hips should all line  up. OFFICE WORK When working at a desk, create an environment that supports good, upright posture. Without extra support, muscles fatigue and lead to excessive strain on joints and other tissues. CHAIR:  A chair should be able to slide under your desk when your back makes contact with the back of the chair. This allows you to work closely.  The chair's height should allow your eyes to be level with the upper part of your monitor and your hands to be slightly lower than your elbows.  Body position:  Your feet should make contact with the floor. If this is not possible, use a foot rest.  Keep your ears over your shoulders. This will reduce stress on your neck and low back.   This information is not intended to replace advice given to you by your health care provider. Make sure you discuss any questions you have with your health care provider.   Document Released: 11/20/2005 Document Revised: 12/11/2014 Document Reviewed: 03/04/2009 Elsevier Interactive  Patient Education 2016 [employer].
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
## 2016-02-10 ENCOUNTER — Telehealth: Payer: Self-pay | Admitting: Pulmonary Disease

## 2016-02-10 NOTE — Telephone Encounter (Signed)
Called and spoke with the pt. She states she would like to reestablish with a doctor at our office for sleep. She was a former [facility] pt. I scheduled the pt with JN on 03/07/16. She voiced understanding and had no further questions. Nothing further needed at this time.

## 2016-02-15 ENCOUNTER — Telehealth: Payer: Self-pay

## 2016-02-15 NOTE — Telephone Encounter (Signed)
Do you remember this Judeth CornfieldStephanie.?

## 2016-02-15 NOTE — Telephone Encounter (Signed)
The patient called to check on paperwork that she left with Judeth CornfieldStephanie English at her OV on 01/06/16.  She said it was a biometric screening report that needed to be faxed over to the insurance company in order to obtain money for her flexible spending account.  She has not received the money, so the insurance have not received the form.  She said her husband's account has already been credited for his report that was sent in.  She does not have a copy of the form - she said she left the original during the OV.  I do not see anything scanned into the system or documented regarding this form.  Please advise, thank you.  CB#: [phone]

## 2016-02-17 NOTE — Telephone Encounter (Addendum)
This as sent off via fax.  I remember very specifically sending this via fax.  Can you please ask her to fax us another copy of this downloaded form.  I have tried to call the insurance, and attempted to contact [employer] (from an old biometric screening form she sent) who said they were not able to fax this or give this to me.  I apologize for this delay, but I definitely sent this.

## 2016-02-22 NOTE — Telephone Encounter (Signed)
Pt will have this form sent to me again.

## 2016-03-07 ENCOUNTER — Encounter: Payer: Self-pay | Admitting: Pulmonary Disease

## 2016-03-07 ENCOUNTER — Ambulatory Visit (INDEPENDENT_AMBULATORY_CARE_PROVIDER_SITE_OTHER): Payer: BLUE CROSS/BLUE SHIELD | Admitting: Pulmonary Disease

## 2016-03-07 VITALS — BP 112/76 | HR 69 | Ht 61.0 in | Wt 130.6 lb

## 2016-03-07 DIAGNOSIS — G4733 Obstructive sleep apnea (adult) (pediatric): Secondary | ICD-10-CM | POA: Diagnosis not present

## 2016-03-07 NOTE — Patient Instructions (Signed)
   We will schedule you for a split-night sleep study to reestablish your CPAP  Please call my office with any further questions or concerns  I will see you back in 3 months or sooner if needed  ORDERS: 1. Split night sleep study

## 2016-03-07 NOTE — Progress Notes (Signed)
Subjective:    Patient ID: Ruth Harrison, female    DOB: Feb 12, 1966, 50 y.o.   MRN: [id number]  C.C.:  Follow-up for Moderate OSA.  HPI Moderate OSA: Patient previously seen by a different physician in our office and after a sleep study in December 2015 was found to have moderate OSA. Previously discussed dental appliance or possibly orthodontics with the patient but she desired to try CPAP first. She reports she was on CPAP for a short period of time but did have a hard time adjusting to it. She reports she is having frequent headaches particularly in the morning. She says her husband reports she is snoring more. No daytime napping. She does doze off easily. Reports she "goes to bed tired and gets up tired".   Review of Systems No chest pain or pressure. No lower extremity edema. No dyspnea or cough.   Allergies  Allergen Reactions  . Oxycodone Hives  . Penicillins Itching  . Prednisone Shortness Of Breath    Chest pain and double vision  . Valacyclovir Hcl Hives  . Sulfa Antibiotics Hives    Current Outpatient Prescriptions on File Prior to Visit  Medication Sig Dispense Refill  . dextromethorphan-guaiFENesin (MUCINEX DM) 30-600 MG per 12 hr tablet Take 1 tablet by mouth 2 (two) times daily as needed for cough.    . dicyclomine (BENTYL) 20 MG tablet Take 1 tablet (20 mg total) by mouth 4 (four) times daily -  before meals and at bedtime. (Patient taking differently: Take 20 mg by mouth 3 (three) times daily as needed for spasms. ) 60 tablet 1  . DIGESTIVE ENZYMES PO Take 500 mg by mouth daily as needed (digestion).     Marland Kitchen. loratadine (CLARITIN) 10 MG tablet Take 10 mg by mouth daily.     No current facility-administered medications on file prior to visit.    Past Medical History  Diagnosis Date  . Endometriosis   . Colon polyps 2010  . Urinary tract infection   . Hemochromatosis, hereditary (HCC)   . Allergy     SEASONAL  . IBS (irritable bowel syndrome)   . Headache   .  Chronic neck pain     Past Surgical History  Procedure Laterality Date  . Abdominal hysterectomy  partial   . Laparoscopic endometriosis fulguration    . Shoulder surgery  2015    right     Family History  Problem Relation Age of Onset  . Adopted: Yes  . Family history unknown: Yes    Social History   Social History  . Marital Status: Married    Spouse Name: N/A  . Number of Children: 1  . Years of Education: N/A   Occupational History  . Environmental health practitionerAdministrative Assistant    Social History Main Topics  . Smoking status: Never Smoker   . Smokeless tobacco: Never Used  . Alcohol Use: No  . Drug Use: No  . Sexual Activity: Yes   Other Topics Concern  . None   Social History Narrative   Originally [place]. Lived in [place] most of her life. Has lived in [place] since 1997. 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 Has 1 adopted daughter and 3 step-sons.       Objective:   Physical Exam BP 112/76 mmHg  Pulse 69  Ht 5\' 1"  (1.549 m)  Wt 130 lb 9.6 oz (59.24 kg)  BMI 24.69 kg/m2  SpO2 97% General:  Awake. Alert. No acute  distress.   Integument:  Warm & dry. No rash on exposed skin.  HEENT:  Moist mucus membranes. No oral ulcers. No bloody class II. High arching palate. Cardiovascular:  Regular rate. No edema. No appreciable JVD.  Pulmonary:  Good aeration & clear to auscultation bilaterally. Symmetric chest wall expansion. No accessory muscle use. Abdomen: Soft. Normal bowel sounds. Nondistended. Grossly nontender. Musculoskeletal:  Normal bulk and tone. No joint deformity or effusion appreciated.  PSG (11/05/14): Overall AHI 23.0. RDI 31.1. Periodic limb movement index 3.1. Lowest REM saturation   Epworth Sleep Score:  8    Assessment & Plan:  50 year old female with previous diagnosis of moderate OSA based on polysomnogram from 2015 on my review. Patient does continue to have poor quality of sleep and effects from her underlying sleep apnea. Previously  was on CPAP for a brief period of time but had discontinued use around the time of shoulder surgery with pain and difficulty with CPAP adherence secondary to this pain. I believe she would benefit from restarting CPAP therapy and she is willing to do so.  1. Moderate OSA: Ordering split-night sleep study with CPAP titration. 2. Follow-up: Patient to return to clinic in 3 months or sooner if needed.  Donna Christen Jamison Neighbor, M.D. [facility] Pul[facility] Critical Care Pager:  [phone] After 3pm or if no response, call [phone] 5:39 PM 03/07/2016

## 2016-03-15 ENCOUNTER — Telehealth: Payer: Self-pay | Admitting: *Deleted

## 2016-03-15 DIAGNOSIS — G4733 Obstructive sleep apnea (adult) (pediatric): Secondary | ICD-10-CM

## 2016-03-15 NOTE — Telephone Encounter (Signed)
Spoke with pt.  Advised order placed for cpap through [facility].  Pt verbalized understanding and voiced no further questions or concerns at this time.

## 2016-03-15 NOTE — Telephone Encounter (Signed)
-----   Message from Roslynn AmbleJennings E Nestor, MD sent at 03/09/2016  6:54 PM EDT ----- Can you put in the order. She needs Auto CPAP with Minimum pressure 4cm H2O and Maximum pressure 14cm H2O. Not sure if she was using a full face or nasal mask but if she is a mouth breather then we need to have her fitted for a full face mask. Let me know if you need anything else. Thanks.  ----- Message -----    From: Ruth Harrison    Sent: 03/08/2016   2:22 PM      To: Roslynn AmbleJennings E Nestor, MD  [facility] order with settings or auto cpap thanks ----- Message -----    From: Roslynn AmbleJennings E Nestor, MD    Sent: 03/08/2016   1:09 PM      To: Ruth Harrison  Fine with me. JN ----- Message -----    From: Ruth Harrison    Sent: 03/08/2016  12:48 PM      To: Roslynn AmbleJennings E Nestor, MD  Pt wants to go ahead and try to get cpap and equiptment from [facility] and see if the old sleep study will work for this i just need orders to do this thanks libby

## 2016-05-02 ENCOUNTER — Encounter (HOSPITAL_BASED_OUTPATIENT_CLINIC_OR_DEPARTMENT_OTHER): Payer: BLUE CROSS/BLUE SHIELD

## 2016-05-26 ENCOUNTER — Encounter: Payer: Self-pay | Admitting: Internal Medicine

## 2016-06-16 ENCOUNTER — Ambulatory Visit (INDEPENDENT_AMBULATORY_CARE_PROVIDER_SITE_OTHER): Payer: BLUE CROSS/BLUE SHIELD | Admitting: Pulmonary Disease

## 2016-06-16 ENCOUNTER — Encounter: Payer: Self-pay | Admitting: Pulmonary Disease

## 2016-06-16 VITALS — BP 128/76 | HR 79 | Ht 61.0 in | Wt 118.8 lb

## 2016-06-16 DIAGNOSIS — Z9989 Dependence on other enabling machines and devices: Principal | ICD-10-CM

## 2016-06-16 DIAGNOSIS — G4733 Obstructive sleep apnea (adult) (pediatric): Secondary | ICD-10-CM

## 2016-06-16 MED ORDER — FLUTICASONE PROPIONATE 50 MCG/ACT NA SUSP: 1.0000 | Freq: Every day | NASAL | Status: DC

## 2016-06-16 NOTE — Addendum Note (Signed)
Addended by: Maxwell MarionBLANKENSHIP, Solana Coggin A on: 06/16/2016 03:36 PM   Modules accepted: Orders

## 2016-06-16 NOTE — Progress Notes (Signed)
Subjective:    Patient ID: Ruth Harrison, female    DOB: 09/12/1966, 50 y.o.   MRN: [id number]  C.C.:  Follow-up for Moderate OSA.  HPI Moderate OSA: Since last appointment she was started on Auto-CPAP with a small nasal mask. Pressure set from 4-14cm H2O. She reports she feels she has adjusted to her machine. She has been wearing it for 6-7 hours a night. She is having to get a new replacement mask. She reports some morning she feels like she has been "blown" full of air. She reports this is more pronounced with using humidification. She is burping frequently. She does feel like her sleep quality has improved. She is still having some fatigue but does feel more rested. Denies any daytime naps. She does have  Some dozing.   Review of Systems No chest pain or pressure. No fever, chills, or sweats. No coughing or dyspnea. She reports some sinus congestion.   Allergies  Allergen Reactions  . Oxycodone Hives  . Penicillins Itching  . Prednisone Shortness Of Breath    Chest pain and double vision  . Valacyclovir Hcl Hives  . Sulfa Antibiotics Hives    Current Outpatient Prescriptions on File Prior to Visit  Medication Sig Dispense Refill  . dextromethorphan-guaiFENesin (MUCINEX DM) 30-600 MG per 12 hr tablet Take 1 tablet by mouth 2 (two) times daily as needed for cough.    Marland Kitchen DIGESTIVE ENZYMES PO Take 500 mg by mouth daily as needed (digestion).     Marland Kitchen loratadine (CLARITIN) 10 MG tablet Take 10 mg by mouth daily.     No current facility-administered medications on file prior to visit.    Past Medical History  Diagnosis Date  . Endometriosis   . Colon polyps 2010  . Urinary tract infection   . Hemochromatosis, hereditary (HCC)   . Allergy     SEASONAL  . IBS (irritable bowel syndrome)   . Headache   . Chronic neck pain     Past Surgical History  Procedure Laterality Date  . Abdominal hysterectomy  partial   . Laparoscopic endometriosis fulguration    . Shoulder surgery   2015    right     Family History  Problem Relation Age of Onset  . Adopted: Yes  . Family history unknown: Yes    Social History   Social History  . Marital Status: Married    Spouse Name: N/A  . Number of Children: 1  . Years of Education: N/A   Occupational History  . Environmental health practitioner    Social History Main Topics  . Smoking status: Never Smoker   . Smokeless tobacco: Never Used  . Alcohol Use: No  . Drug Use: No  . Sexual Activity: Yes   Other Topics Concern  . None   Social History Narrative   Originally from [place]. Lived in [place] most of her life. Has lived in [place] since 1997. She is an Environmental health practitioner for [employer]. She is married. Has 1 adopted daughter and 3 step-sons.       Objective:   Physical Exam BP 128/76 mmHg  Pulse 79  Ht  (1.549 m)  Wt 118 lb 12.8 oz (53.887 kg)  BMI 22.46 kg/m2  SpO2 98% General:  Awake. Alert. No distress.   Integument:  Warm & dry. No rash on exposed skin.  HEENT:  Moist mucus membranes. No oral ulcers. Bilateral nasal turbinate swelling right greater than left. Cardiovascular:  Regular rate. No edema. No  appreciable JVD.  Pulmonary:  Normal work of breathing on room air. Clear on auscultation. Abdomen: Soft. Normal bowel sounds. Nondistended. Grossly nontender. Musculoskeletal:  Normal bulk and tone. No joint deformity or effusion appreciated.  PSG (11/05/14): Overall AHI 23.0. RDI 31.1. Periodic limb movement index 3.1. Lowest REM saturation   Epworth Sleep Score:  8    Assessment & Plan:  50 year old female with previous diagnosis of moderate OSA.Seems to be tolerating her new auto titrating CPAP well. Do question whether or not she may have increased pressure from her CPAP contributing to her symptoms. Starting Flonase 1 spray each nostril once daily given nasal turbinate swelling. Instructed patient to contact my office for any new breathing problems or questions before next appointment.  1. Moderate  OSA: Continuing auto CPAP. Obtaining CPAP download. Starting Flonase 1 spray each nostril once daily. 2. Follow-up: Patient to return to clinic in  3 months or sooner if needed.  Donna ChristenJennings E. Jamison NeighborNestor, M.D. [facility] Pager:  [phone] After 3pm or if no response, call [phone] 3:19 PM 06/16/2016

## 2016-06-16 NOTE — Patient Instructions (Signed)
   Use the Flonase once daily. Call me if you have any further problems.  We will contact you once we have the download from your machine.  I will see you back in 3 months or sooner if needed.

## 2016-06-19 ENCOUNTER — Telehealth: Payer: Self-pay | Admitting: *Deleted

## 2016-06-19 DIAGNOSIS — G4733 Obstructive sleep apnea (adult) (pediatric): Secondary | ICD-10-CM

## 2016-06-19 IMAGING — MG MM SCREEN MAMMOGRAM BILATERAL
4 series · 4 of 4 positions shown · non-contrast
Comparison: Previous exam(s).

CLINICAL DATA: Screening.

EXAM:
DIGITAL SCREENING BILATERAL MAMMOGRAM WITH CAD

[R CC]
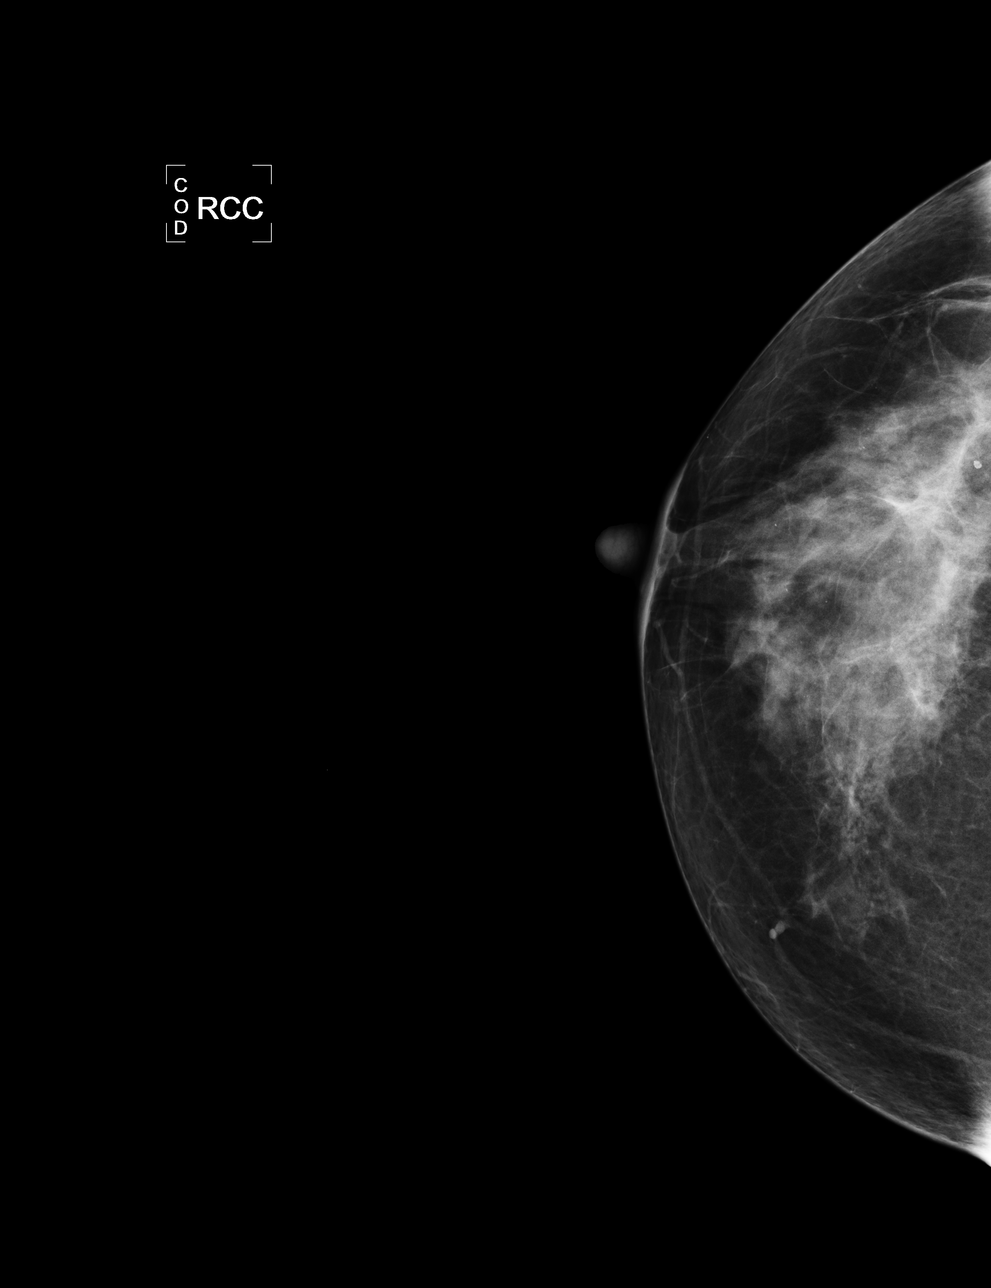

[L CC]
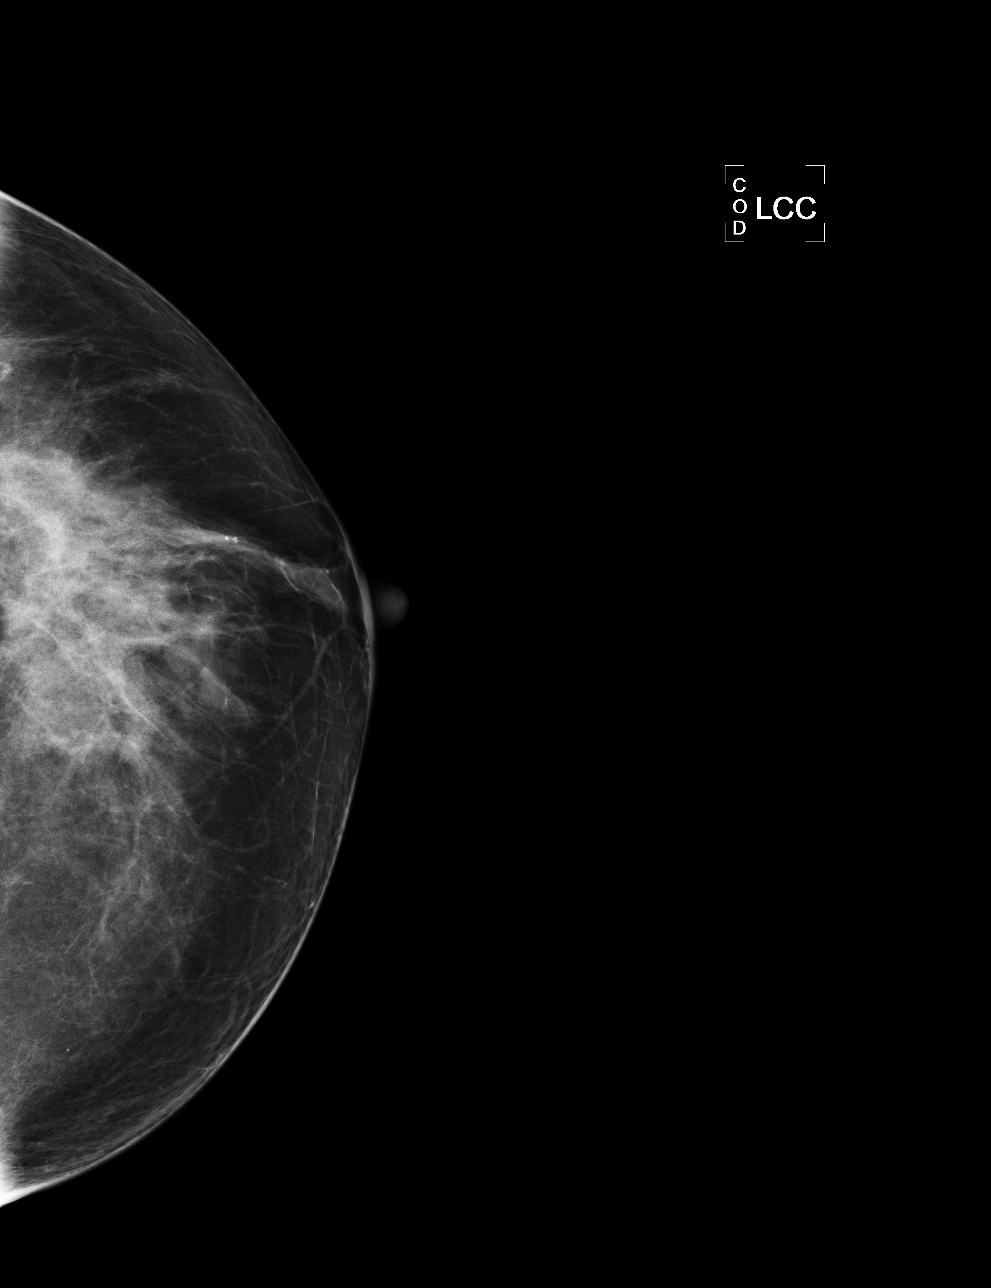

[L MLO]
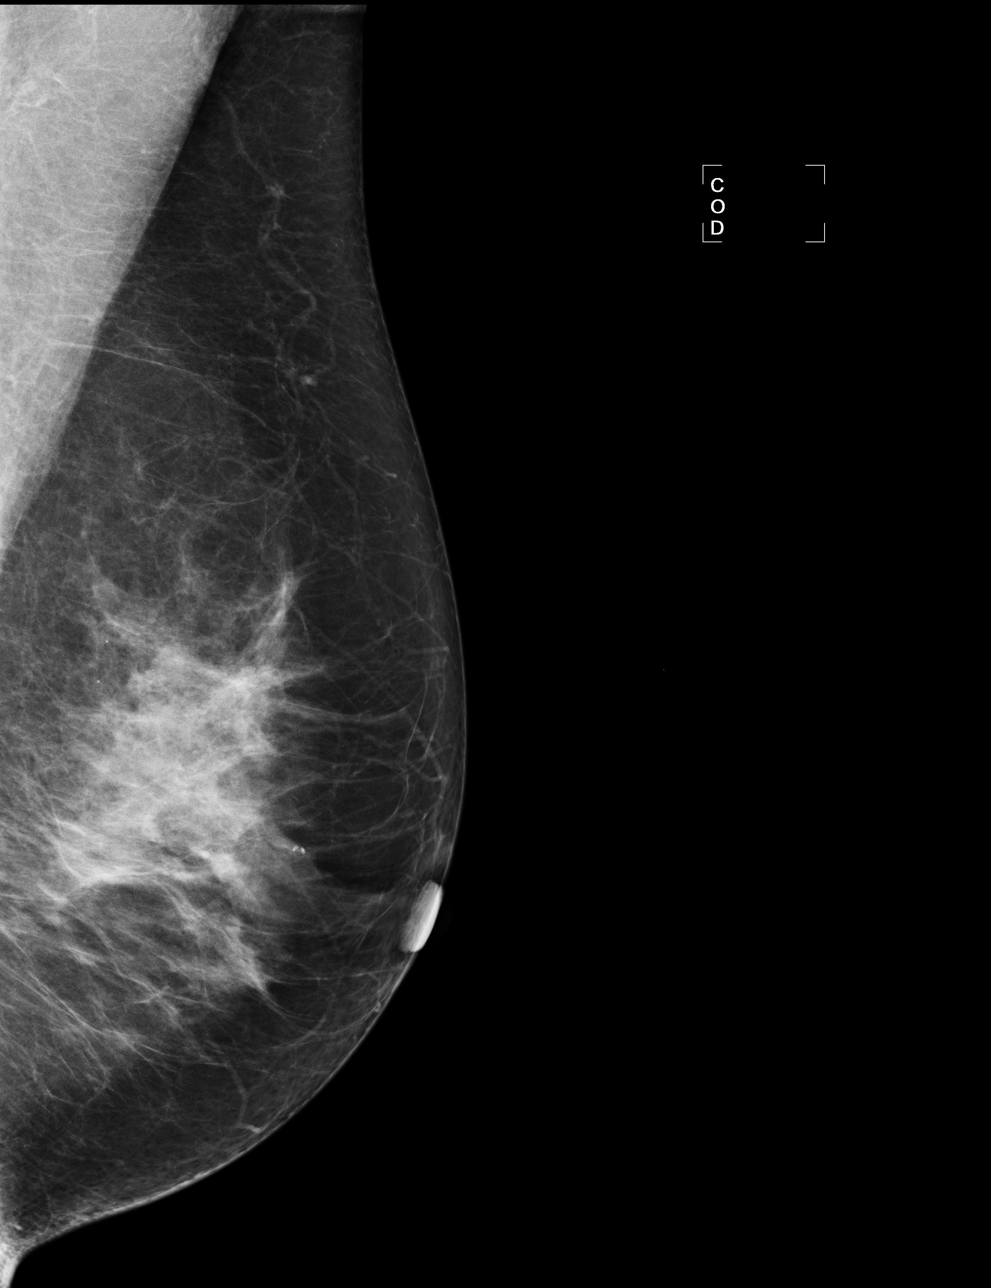

[R MLO]
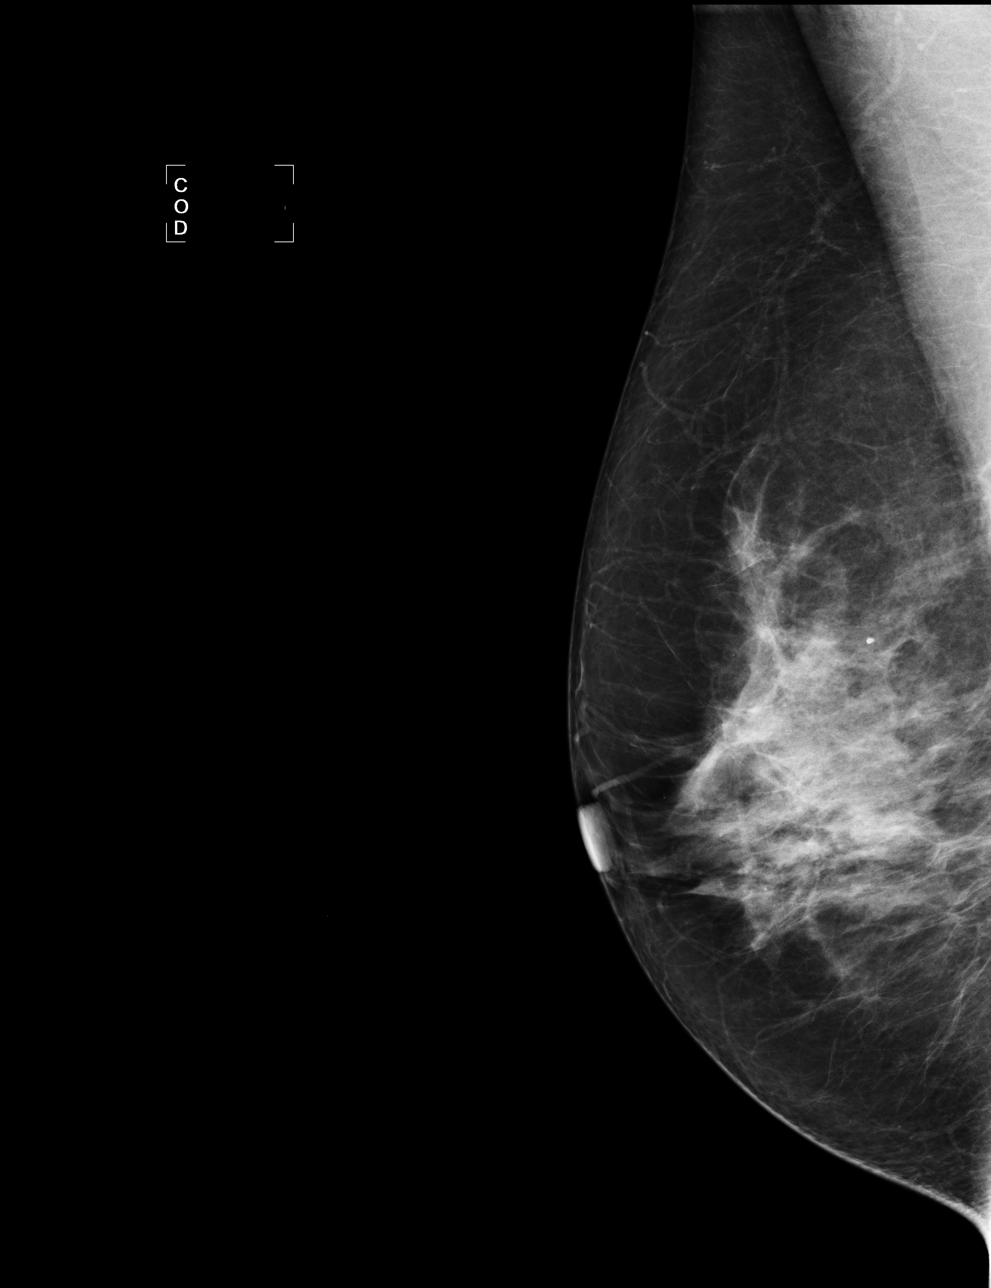

[4 of 4 positions shown; findings below may reference images not displayed]

ACR Breast Density Category d: The breast tissue is extremely dense,
which lowers the sensitivity of mammography.
FINDINGS: There are no findings suspicious for malignancy. Images were
processed with CAD.
IMPRESSION: No mammographic evidence of malignancy. A result letter of this
screening mammogram will be mailed directly to the patient.

RECOMMENDATION:
Screening mammogram in one year. (Code:[id number])

BI-RADS CATEGORY  1: Negative.

## 2016-06-19 NOTE — Telephone Encounter (Signed)
-----   Message from Roslynn AmbleJennings E Nestor, MD sent at 06/16/2016  4:54 PM EDT ----- Please let the patient know that her CPAP looks like it is working well. Can we change the setting to a Max Pressure of 12 cm H2O. Please have the patient call us if her symptoms do not improve with the Flonase I started today.  Thanks.

## 2016-06-19 NOTE — Telephone Encounter (Signed)
I spoke with patient about results and she verbalized understanding and had no questions Order placed 

## 2016-06-26 ENCOUNTER — Other Ambulatory Visit (HOSPITAL_BASED_OUTPATIENT_CLINIC_OR_DEPARTMENT_OTHER): Payer: BLUE CROSS/BLUE SHIELD

## 2016-06-26 LAB — CBC WITH DIFFERENTIAL/PLATELET
BASO%: 0.2 % (ref 0.0–2.0)
BASOS ABS: 0 10*3/uL (ref 0.0–0.1)
EOS%: 1 % (ref 0.0–7.0)
Eosinophils Absolute: 0.1 10*3/uL (ref 0.0–0.5)
HEMATOCRIT: 38.9 % (ref 34.8–46.6)
HGB: 13.2 g/dL (ref 11.6–15.9)
LYMPH#: 1.9 10*3/uL (ref 0.9–3.3)
LYMPH%: 37 % (ref 14.0–49.7)
MCH: 32.2 pg (ref 25.1–34.0)
MCHC: 33.9 g/dL (ref 31.5–36.0)
MCV: 94.9 fL (ref 79.5–101.0)
MONO#: 0.5 10*3/uL (ref 0.1–0.9)
MONO%: 9.3 % (ref 0.0–14.0)
NEUT#: 2.7 10*3/uL (ref 1.5–6.5)
NEUT%: 52.5 % (ref 38.4–76.8)
Platelets: 157 10*3/uL (ref 145–400)
RBC: 4.1 10*6/uL (ref 3.70–5.45)
RDW: 12.5 % (ref 11.2–14.5)
WBC: 5.2 10*3/uL (ref 3.9–10.3)

## 2016-06-26 LAB — COMPREHENSIVE METABOLIC PANEL
ALT: 16 U/L (ref 0–55)
AST: 17 U/L (ref 5–34)
Albumin: 3.9 g/dL (ref 3.5–5.0)
Alkaline Phosphatase: 62 U/L (ref 40–150)
Anion Gap: 8 mEq/L (ref 3–11)
BUN: 13.2 mg/dL (ref 7.0–26.0)
CALCIUM: 9 mg/dL (ref 8.4–10.4)
CO2: 26 mEq/L (ref 22–29)
Chloride: 106 mEq/L (ref 98–109)
Creatinine: 0.7 mg/dL (ref 0.6–1.1)
EGFR: >90 mL/min/{1.73_m2} (ref >90)
Glucose: 89 mg/dl (ref 70–140)
Potassium: 4.3 mEq/L (ref 3.5–5.1)
Sodium: 140 mEq/L (ref 136–145)
Total Bilirubin: <0.3 mg/dL (ref 0.20–1.20)
Total Protein: 7.4 g/dL (ref 6.4–8.3)

## 2016-06-27 ENCOUNTER — Telehealth: Payer: Self-pay | Admitting: Internal Medicine

## 2016-06-27 LAB — FERRITIN: Ferritin: 41 ng/ml (ref 9–269)

## 2016-06-27 LAB — IRON AND TIBC
%SAT: 42 % (ref 21–57)
Iron: 103 ug/dL (ref 41–142)
TIBC: 246 ug/dL (ref 236–444)
UIBC: 143 ug/dL (ref 120–384)

## 2016-06-27 NOTE — Telephone Encounter (Signed)
PAL - moved 7/31 appointments to 8/8. Spoke with patient she is aware.

## 2016-07-03 ENCOUNTER — Ambulatory Visit: Payer: BLUE CROSS/BLUE SHIELD | Admitting: Internal Medicine

## 2016-07-11 ENCOUNTER — Telehealth: Payer: Self-pay | Admitting: Internal Medicine

## 2016-07-11 ENCOUNTER — Other Ambulatory Visit: Payer: Self-pay

## 2016-07-11 ENCOUNTER — Encounter: Payer: Self-pay | Admitting: Internal Medicine

## 2016-07-11 ENCOUNTER — Ambulatory Visit (HOSPITAL_BASED_OUTPATIENT_CLINIC_OR_DEPARTMENT_OTHER): Payer: BLUE CROSS/BLUE SHIELD | Admitting: Internal Medicine

## 2016-07-11 MED ORDER — DICYCLOMINE HCL 20 MG PO TABS: 20.0000 mg | ORAL_TABLET | Freq: Three times a day (TID) | ORAL | 0 refills | Status: DC

## 2016-07-11 NOTE — Telephone Encounter (Signed)
Gave pt cal & avs °

## 2016-07-11 NOTE — Progress Notes (Signed)
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   PRIOR THERAPY: Weekly phlebotomies x3, last one was in January of 2014.   CURRENT THERAPY: Observation.  INTERVAL HISTORY: Ruth Harrison 50 y.o. female returns to the clinic today for followup visit. The patient has no complaints today except for persistent arthralgia and mild fatigue. She intentionally lost 15 pounds since her last visit after the patient was started on strict diet with no carbohydrate. She denied having any chest pain, shortness of breath, cough or hemoptysis. She has no fever or chills, no nausea or vomiting. She had repeat CBC, comprehensive metabolic panel, iron study and ferritin performed recently and she is here for evaluation and discussion of her lab results.  MEDICAL HISTORY: Past Medical History:  Diagnosis Date  . Allergy    SEASONAL  . Chronic neck pain   . Colon polyps 2010  . Endometriosis   . Headache   . Hemochromatosis, hereditary (HCC)   . IBS (irritable bowel syndrome)   . Urinary tract infection     ALLERGIES:  is allergic to oxycodone; penicillins; prednisone; valacyclovir hcl; and sulfa antibiotics.  MEDICATIONS:  Current Outpatient Prescriptions  Medication Sig Dispense Refill  . dextromethorphan-guaiFENesin (MUCINEX DM) 30-600 MG per 12 hr tablet Take 1 tablet by mouth 2 (two) times daily as needed for cough.    Marland Kitchen. DIGESTIVE ENZYMES PO Take 500 mg by mouth daily as needed (digestion).     . fluticasone (FLONASE) 50 MCG/ACT nasal spray Place 1 spray into both nostrils daily. 16 g 3  . loratadine (CLARITIN) 10 MG tablet Take 10 mg by mouth daily.     No current facility-administered medications for this visit.     SURGICAL HISTORY:  Past Surgical History:  Procedure Laterality Date  . ABDOMINAL HYSTERECTOMY  partial    . LAPAROSCOPIC ENDOMETRIOSIS FULGURATION    . SHOULDER SURGERY  2015   right     REVIEW OF SYSTEMS:  A comprehensive review of systems was negative except for: Musculoskeletal: positive for arthralgias   PHYSICAL EXAMINATION: General appearance: alert, cooperative and no distress Head: Normocephalic, without obvious abnormality, atraumatic Neck: no adenopathy Lymph nodes: Cervical, supraclavicular, and axillary nodes normal. Resp: clear to auscultation bilaterally Cardio: regular rate and rhythm, S1, S2 normal, no murmur, click, rub or gallop GI: soft, non-tender; bowel sounds normal; no masses,  no organomegaly Extremities: extremities normal, atraumatic, no cyanosis or edema  ECOG PERFORMANCE STATUS: 1 - Symptomatic but completely ambulatory  Blood pressure 103/63, pulse 66, temperature 98.2 F (36.8 C), temperature source Oral, resp. rate 18, height 5\' 1"  (1.549 m), weight 118 lb (53.5 kg), SpO2 100 %.  LABORATORY DATA: Lab Results  Component Value Date   WBC 5.2 06/26/2016   HGB 13.2 06/26/2016   HCT 38.9 06/26/2016   MCV 94.9 06/26/2016   PLT 157 06/26/2016      Chemistry      Component Value Date/Time   NA 140 06/26/2016 1613   K 4.3 06/26/2016 1613   CL 105 07/10/2015 2145   CL 103 08/15/2012 1600   CO2 26 06/26/2016 1613   BUN 13.2 06/26/2016 1613   CREATININE 0.7 06/26/2016 1613      Component Value Date/Time   CALCIUM 9.0 06/26/2016 1613   ALKPHOS 62 06/26/2016 1613   AST 17 06/26/2016 1613  ALT 16 06/26/2016 1613   BILITOT <0.30 06/26/2016 1613     Other lab results: Ferritin 41, serum iron 103, total iron binding capacity 246 and iron saturation 42%  RADIOGRAPHIC STUDIES: No results found.  ASSESSMENT AND PLAN: This is a very pleasant 50 years old white female with hereditary hemochromatosis with homozygous C282Y mutation. She status post phlebotomy last one was performed in January of 2014 and she has been observation since that time. The  patient has been doing fine and no concerning findings on the recent blood work. I recommended for her to continue on observation with repeat CBC, comprehensive metabolic panel, ferritin and iron study in 6 months. She was advised to call immediately if she has any concerning symptoms in the interval.  All questions were answered. The patient knows to call the clinic with any problems, questions or concerns. We can certainly see the patient much sooner if necessary.  Disclaimer: This note was dictated with voice recognition software. Similar sounding words can inadvertently be transcribed and may be missed upon review.

## 2016-07-13 ENCOUNTER — Encounter: Payer: Self-pay | Admitting: Physician Assistant

## 2016-09-12 ENCOUNTER — Telehealth: Payer: Self-pay | Admitting: Pulmonary Disease

## 2016-09-12 DIAGNOSIS — G4733 Obstructive sleep apnea (adult) (pediatric): Secondary | ICD-10-CM

## 2016-09-12 NOTE — Telephone Encounter (Signed)
Spoke with pt. States that her CPAP machine needs to be adjusted. Reports feeling "full of air" and bloating. Feels like the pressure is to high. Her current DME is [facility] and her pressure is set at 12cm.  JN - please advise. Thanks.

## 2016-09-12 NOTE — Telephone Encounter (Signed)
Called [facility] and spoke with [place]. He states that they have her pressure listed at auto 4-12cm. I apologize for the confusion.  JN - please advise. Thanks.

## 2016-09-12 NOTE — Telephone Encounter (Signed)
She actually has an Auto-PAP set up to max 12cm H2O per the last download in July. Please check with [facility] and get another download to make sure things haven't changed that I'm aware of. Let me know when we get the download. Thanks.

## 2016-09-17 ENCOUNTER — Encounter: Payer: Self-pay | Admitting: Pulmonary Disease

## 2016-09-19 ENCOUNTER — Ambulatory Visit (INDEPENDENT_AMBULATORY_CARE_PROVIDER_SITE_OTHER): Payer: BLUE CROSS/BLUE SHIELD | Admitting: Pulmonary Disease

## 2016-09-19 ENCOUNTER — Encounter: Payer: Self-pay | Admitting: Pulmonary Disease

## 2016-09-19 ENCOUNTER — Ambulatory Visit (INDEPENDENT_AMBULATORY_CARE_PROVIDER_SITE_OTHER): Payer: BLUE CROSS/BLUE SHIELD | Admitting: Student

## 2016-09-19 VITALS — BP 100/58 | HR 72 | Temp 98.4°F | Ht 61.0 in | Wt 113.0 lb

## 2016-09-19 DIAGNOSIS — G4733 Obstructive sleep apnea (adult) (pediatric): Secondary | ICD-10-CM | POA: Diagnosis not present

## 2016-09-19 DIAGNOSIS — J019 Acute sinusitis, unspecified: Secondary | ICD-10-CM

## 2016-09-19 DIAGNOSIS — J0141 Acute recurrent pansinusitis: Secondary | ICD-10-CM

## 2016-09-19 DIAGNOSIS — Z9989 Dependence on other enabling machines and devices: Secondary | ICD-10-CM | POA: Diagnosis not present

## 2016-09-19 MED ORDER — AZITHROMYCIN 250 MG PO TABS: ORAL_TABLET | ORAL | 0 refills | Status: DC

## 2016-09-19 NOTE — Patient Instructions (Signed)
   Remember to contact [facility] to talk about a fix for your head gear slipping which is likely causing the leak and thus the pressure in your chest.  If you continue to have the pressure sensation in your chest or ongoing leaks let me know.  I will see you back in 1 year or sooner if needed.

## 2016-09-19 NOTE — Assessment & Plan Note (Addendum)
Will treat conservatively with fluids, nasal saline, mucinex.  Recommended waiting for a few days for improvement before taking antibiotics for sinusitis.  She can not pick up rx if she improves without intervention.

## 2016-09-19 NOTE — Telephone Encounter (Signed)
Spoke with pt. States that she saw JN this morning. He wanted her to contact [facility] about the issues she has been having. [facility] is going to be sending a fax to us to have her pressure lowered. Will route message to Morrie Sheldonshley to keep a look at out for this fax.

## 2016-09-19 NOTE — Progress Notes (Signed)
Subjective:    Patient ID: Ruth Harrison, female    DOB: 1966-11-30, 50 y.o.   MRN: [id number]  C.C.:  Follow-up for Moderate OSA.  HPI Moderate OSA: Started on Flonase at last appointment for sinus congestion. She is on Auto-PAP previously with maximum pressure of 14cm H2O. Max pressure was decreased to 12 cm H2O with her report that it was too much pressure. She is using a nasal mask. She reports her mask fits well. She reports she has had discomfort in her chest presumably from the "pressure" of the air. She reports this doesn't happen every night but has been occurring more frequently:  2-3 nights per week in the last 3 weeks. She reports she wakes up frequently during the night. She reports it's hard to keep her mask in place with "slippage" from the band for her mask. She reports she does feel better rested using it.   She reports she has had more sinus congestion & pain in her teeth this morning. She reports she just started having drainage on Sunday with a preceding sore throat. She reports there is a mild greenish tinge to her mucus. She has continued to use her Flonase nasal spray but had stopped her allergy medication.    Review of Systems  No fever, chills, or sweats. No dyspnea or coughing except with drainage. No chest pain or pressure.   Allergies  Allergen Reactions  . Oxycodone Hives  . Penicillins Itching  . Prednisone Shortness Of Breath    Chest pain and double vision  . Valacyclovir Hcl Hives  . Sulfa Antibiotics Hives    Current Outpatient Prescriptions on File Prior to Visit  Medication Sig Dispense Refill  . dextromethorphan-guaiFENesin (MUCINEX DM) 30-600 MG per 12 hr tablet Take 1 tablet by mouth 2 (two) times daily as needed for cough.    . dicyclomine (BENTYL) 20 MG tablet Take 1 tablet (20 mg total) by mouth 4 (four) times daily -  before meals and at bedtime. 60 tablet 0  . DIGESTIVE ENZYMES PO Take 500 mg by mouth daily as needed (digestion).     .  fluticasone (FLONASE) 50 MCG/ACT nasal spray Place 1 spray into both nostrils daily. 16 g 3  . loratadine (CLARITIN) 10 MG tablet Take 10 mg by mouth daily.     No current facility-administered medications on file prior to visit.     Past Medical History:  Diagnosis Date  . Allergy    SEASONAL  . Chronic neck pain   . Colon polyps 2010  . Endometriosis   . Headache   . Hemochromatosis, hereditary (HCC)   . IBS (irritable bowel syndrome)   . Urinary tract infection     Past Surgical History:  Procedure Laterality Date  . ABDOMINAL HYSTERECTOMY  partial   . LAPAROSCOPIC ENDOMETRIOSIS FULGURATION    . SHOULDER SURGERY  2015   right     Family History  Problem Relation Age of Onset  . Adopted: Yes  . Family history unknown: Yes    Social History   Social History  . Marital status: Married    Spouse name: N/A  . Number of children: 1  . Years of education: N/A   Occupational History  . Environmental health practitioner    Social History Main Topics  . Smoking status: Never Smoker  . Smokeless tobacco: Never Used  . Alcohol use No  . Drug use: No  . Sexual activity: Yes   Other Topics Concern  .  None   Social History Narrative   Originally from [place]. Lived in [place] most of her life. Has lived in [place] since 1997. 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 Has 1 adopted daughter and 3 step-sons.       Objective:   Physical Exam BP (!) 100/58 (BP Location: Left Arm, Cuff Size: Normal)   Pulse 72   Temp 98.4 F (36.9 C) (Oral)   Ht 5\' 1"  (1.549 m)   Wt 113 lb (51.3 kg)   SpO2 99%   BMI 21.35 kg/m  General:  Awake. Alert. Comfortable. Well-dressed. Integument:  Warm & dry. No rash on exposed skin.  HEENT:  Moist mucus membranes. Right greater than left nasal turbinate swelling. No sinus tenderness to palpation. Cardiovascular:  Regular rate. No edema. Normal S1 & S2.  Pulmonary:  Normal work of breathing on room air. Clear on  auscultation. Abdomen: Soft. Normal bowel sounds. Nondistended. Nontender. Musculoskeletal:  Normal bulk and tone. No joint deformity or effusion appreciated.  PSG (11/05/14): Overall AHI 23.0. RDI 31.1. Periodic limb movement index 3.1. Lowest REM saturation   Epworth Sleep Score:  8  CPAP DOWNLOAD 9/16 - 09/17/16:  100% usage. Average usage 7 hours 9 minutes. Min pressure 4 & Max pressure 12. Maximum pressure 10cm H2O used. AHI residual 2.3 events/hr.     Assessment & Plan:  50 y.o. female with previous diagnosis of moderate OSA. Patient does have symptoms suggestive of acute sinusitis likely viral in etiology today. In the absence of infectious symptoms I do not feel antibiotic treatment is necessary. Her CPAP download does indeed show some leaks that are intermittent and likely contributing to some of this discomfort she is reporting. I instructed the patient to notify me if her chest discomfort did not significantly improve with her leak improvement and alternative headgear.  1. Moderate OSA: Continuing patient on AutoPap. Patient to inquire with her medical supply company regarding a better fitting headgear which will likely improve her leak and abnormal chest sensation. 2. Acute Sinusitis:Likely viral in etiology. Patient instructed to contact either me or her PCP if symptoms did not improve within 7 days before she experienced any new or worsening symptoms. 3. Follow-up: Patient to return to clinic in 1 year or sooner if needed.  Donna ChristenJennings E. Jamison NeighborNestor, M.D. [facility] Pager:  [phone] After 3pm or if no response, call [phone] 9:57 AM 09/19/16

## 2016-09-19 NOTE — Progress Notes (Signed)
   Subjective:    Patient ID: Ruth NeatAngela M Leatherbury, female    DOB: 07-26-1966, 50 y.o.   MRN: [id number]  HPI Presents with 3 days of nasal congestion, sinus headache, ear pain, sore throat, cough, purulent nasal congestion.  Started as sore throat and has worsened from there.  She has this infection twice a year and it usually only resolves with antibiotics.  Denies fevers, chills, sptum production, chest pain, shortness of breath.  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    Review of Systems  Constitutional: Negative for chills, fatigue and fever.  HENT: Positive for congestion, ear pain, postnasal drip, rhinorrhea, sinus pressure and sore throat. Negative for trouble swallowing and voice change.   Eyes: Negative for pain and redness.  Respiratory: Positive for cough. Negative for chest tightness, shortness of breath and wheezing.   Cardiovascular: Negative for chest pain and leg swelling.  Gastrointestinal: Negative for abdominal pain and nausea.  Genitourinary: Negative for dysuria and urgency.  Musculoskeletal: Negative for arthralgias and joint swelling.  Skin: Negative for rash and wound.  Psychiatric/Behavioral: Negative for agitation and confusion.  All other systems reviewed and are negative.      Objective:   Physical Exam  Constitutional: She is oriented to person, place, and time. She appears well-developed and well-nourished. No distress.  HENT:  Head: Normocephalic and atraumatic.  Right Ear: External ear normal.  Left Ear: External ear normal.  Mouth/Throat: No oropharyngeal exudate.  Right TM with fluid behind  Left without fluid Erythematous nasal mucosa Erythematous oropharynx without exudates  Eyes: Conjunctivae are normal. No scleral icterus.  Neck: Normal range of motion. Neck supple.  Cardiovascular: Normal rate, regular rhythm, normal heart sounds and intact distal pulses.  Exam reveals no gallop and no friction rub.   No murmur heard. Pulmonary/Chest: Effort  normal and breath sounds normal. No respiratory distress. She has no wheezes. She has no rales. She exhibits no tenderness.  Musculoskeletal: Normal range of motion. She exhibits no edema.  Lymphadenopathy:    She has no cervical adenopathy.  Neurological: She is alert and oriented to person, place, and time.  Skin: Skin is warm. No rash noted. She is not diaphoretic. No erythema.  Psychiatric: She has a normal mood and affect. Her behavior is normal. Judgment and thought content normal.  Nursing note and vitals reviewed.  BP (!) 100/58 (Cuff Size: Normal)   Pulse 75   Temp 98 F (36.7 C) (Oral)   Resp 18   Wt 112 lb 9.6 oz (51.1 kg)   SpO2 99%   BMI 21.28 kg/m         Assessment & Plan:  Acute sinusitis Will treat conservatively with fluids, nasal saline, mucinex.  Recommended waiting for a few days for improvement before taking antibiotics for sinusitis.  She can not pick up rx if she improves without intervention.    Signed,  Corliss MarcusAlicia Barnes, DO [facility] 6:50 PM  09/19/16

## 2016-09-20 ENCOUNTER — Encounter: Payer: Self-pay | Admitting: Pulmonary Disease

## 2016-09-20 ENCOUNTER — Telehealth: Payer: Self-pay | Admitting: Pulmonary Disease

## 2016-09-20 NOTE — Telephone Encounter (Signed)
Entered in error

## 2016-09-21 NOTE — Telephone Encounter (Signed)
Morrie Sheldonshley have you received these forms from [employer]?  thanks

## 2016-09-21 NOTE — Telephone Encounter (Signed)
I have not received any forms on this patient. Called [employer], this is being refaxed.  Will continue to look out for.

## 2016-09-25 NOTE — Telephone Encounter (Signed)
I have checked JN's look at. This form has not been received yet. A message has been left with [employer] to have this refaxed again.

## 2016-09-26 NOTE — Telephone Encounter (Signed)
Called [employer] and spoke with Tiffany. They are going to refax this form to us. Will route to [place] to follow up on.

## 2016-09-26 NOTE — Telephone Encounter (Signed)
This form has still not been received. I attempted to call [employer] again, their office is not open. Will route to triage to call back later this AM.

## 2016-09-27 NOTE — Telephone Encounter (Signed)
Checked Ruth Harrison's box. We received a download on the pt from [employer] but not a form to change her pressure.

## 2016-09-29 NOTE — Telephone Encounter (Signed)
Called and spoke with pt and she stated that she already has the smallest head gear that is available and she stated that she knows it is a pressure problem.  She stated that she got up this morning and was full of air in her chest, stomach and wants the pressure to be adjusted. thanks

## 2016-09-29 NOTE — Telephone Encounter (Signed)
The plan before adjusting her pressure further was to find head gear that would fit better and prevent the leak which is likely increasing the pressure and contributing to if not producing her symptoms.

## 2016-09-29 NOTE — Telephone Encounter (Signed)
K. Let me know when we get the form.

## 2016-10-02 NOTE — Telephone Encounter (Signed)
Ok. Can someone request the form and place it in my folder for my return on Monday? Thanks.

## 2016-10-02 NOTE — Telephone Encounter (Signed)
Checked JN's look at. The only document we have on this patient is her download. We do not have a form to change her pressure.

## 2016-10-03 NOTE — Telephone Encounter (Signed)
Please change her to a maximum pressure of 9cm H2O. Thanks.

## 2016-10-03 NOTE — Telephone Encounter (Signed)
Order placed to change pressure.   Pt aware of order.   Nothing further needed.

## 2016-10-03 NOTE — Telephone Encounter (Signed)
To change pressures we simply place an order to the DME company through [employer]. JN please advise on what pressure you would like patient to be set on.  Thanks!

## 2016-10-25 ENCOUNTER — Ambulatory Visit: Payer: BLUE CROSS/BLUE SHIELD | Admitting: Family Medicine

## 2016-11-08 ENCOUNTER — Other Ambulatory Visit: Payer: Self-pay | Admitting: Student

## 2016-11-08 ENCOUNTER — Other Ambulatory Visit: Payer: Self-pay | Admitting: Emergency Medicine

## 2016-11-08 ENCOUNTER — Other Ambulatory Visit: Payer: Self-pay | Admitting: Family Medicine

## 2016-11-08 DIAGNOSIS — Z1231 Encounter for screening mammogram for malignant neoplasm of breast: Secondary | ICD-10-CM

## 2016-11-14 ENCOUNTER — Ambulatory Visit
Admission: RE | Admit: 2016-11-14 | Discharge: 2016-11-14 | Disposition: A | Discharge status: Home / Self Care | Payer: BLUE CROSS/BLUE SHIELD | Source: Ambulatory Visit | Attending: Family Medicine | Admitting: Family Medicine

## 2016-11-14 DIAGNOSIS — Z1231 Encounter for screening mammogram for malignant neoplasm of breast: Secondary | ICD-10-CM

## 2016-11-15 ENCOUNTER — Other Ambulatory Visit (INDEPENDENT_AMBULATORY_CARE_PROVIDER_SITE_OTHER): Payer: BLUE CROSS/BLUE SHIELD

## 2016-11-15 ENCOUNTER — Ambulatory Visit (INDEPENDENT_AMBULATORY_CARE_PROVIDER_SITE_OTHER): Payer: BLUE CROSS/BLUE SHIELD | Admitting: Gastroenterology

## 2016-11-15 ENCOUNTER — Encounter: Payer: Self-pay | Admitting: Gastroenterology

## 2016-11-15 VITALS — BP 100/52 | HR 72 | Ht 61.0 in | Wt 108.2 lb

## 2016-11-15 DIAGNOSIS — R634 Abnormal weight loss: Secondary | ICD-10-CM | POA: Diagnosis not present

## 2016-11-15 DIAGNOSIS — Z86010 Personal history of colonic polyps: Secondary | ICD-10-CM

## 2016-11-15 DIAGNOSIS — K581 Irritable bowel syndrome with constipation: Secondary | ICD-10-CM | POA: Diagnosis not present

## 2016-11-15 LAB — BASIC METABOLIC PANEL
BUN: 12 mg/dL (ref 6–23)
CHLORIDE: 102 meq/L (ref 96–112)
CO2: 27 meq/L (ref 19–32)
CREATININE: 0.52 mg/dL (ref 0.40–1.20)
Calcium: 9.4 mg/dL (ref 8.4–10.5)
GFR: 132.55 mL/min (ref >60.00)
Glucose, Bld: 84 mg/dL (ref 70–99)
POTASSIUM: 3.7 meq/L (ref 3.5–5.1)
Sodium: 138 mEq/L (ref 135–145)

## 2016-11-15 LAB — HEPATIC FUNCTION PANEL
ALT: 17 U/L (ref 0–35)
AST: 16 U/L (ref 0–37)
Albumin: 4.5 g/dL (ref 3.5–5.2)
Alkaline Phosphatase: 47 U/L (ref 39–117)
BILIRUBIN DIRECT: 0.1 mg/dL (ref 0.0–0.3)
BILIRUBIN TOTAL: 0.6 mg/dL (ref 0.2–1.2)
TOTAL PROTEIN: 7.6 g/dL (ref 6.0–8.3)

## 2016-11-15 LAB — CBC WITH DIFFERENTIAL/PLATELET
BASOS PCT: 0.4 % (ref 0.0–3.0)
Basophils Absolute: 0 10*3/uL (ref 0.0–0.1)
EOS ABS: 0 10*3/uL (ref 0.0–0.7)
EOS PCT: 0.9 % (ref 0.0–5.0)
HCT: 40.1 % (ref 36.0–46.0)
Hemoglobin: 13.6 g/dL (ref 12.0–15.0)
LYMPHS ABS: 1.7 10*3/uL (ref 0.7–4.0)
Lymphocytes Relative: 40.8 % (ref 12.0–46.0)
MCHC: 34 g/dL (ref 30.0–36.0)
MCV: 94.5 fl (ref 78.0–100.0)
MONO ABS: 0.3 10*3/uL (ref 0.1–1.0)
Monocytes Relative: 8.1 % (ref 3.0–12.0)
NEUTROS PCT: 49.8 % (ref 43.0–77.0)
Neutro Abs: 2.1 10*3/uL (ref 1.4–7.7)
Platelets: 191 10*3/uL (ref 150.0–400.0)
RBC: 4.24 Mil/uL (ref 3.87–5.11)
RDW: 13.2 % (ref 11.5–15.5)
WBC: 4.3 10*3/uL (ref 4.0–10.5)

## 2016-11-15 LAB — TSH: TSH: 1.74 u[IU]/mL (ref 0.35–4.50)

## 2016-11-15 NOTE — Progress Notes (Signed)
History of Present Illness: This is a 50 year old female with irritable bowel syndrome. She generally has several days without a bowel movement with increasing generalized abdominal discomfort and bloating. Then will have urgent bowel movements often followed by diarrhea for 1-2 days. She substantially changed her diet past spring and avoid all wheat products and minimize his sugars. She has lost 23 pounds with this diet. She states she is continuing to lose weight but would like her weight to be stable and a few pounds higher than her current weight.  Colonoscopy 10/2014 normal.   Allergies  Allergen Reactions  . Oxycodone Hives  . Penicillins Itching  . Prednisone Shortness Of Breath    Chest pain and double vision  . Valacyclovir Hcl Hives  . Sulfa Antibiotics Other (See Comments)    Unknown reaction not anyphalaxis   Outpatient Medications Prior to Visit  Medication Sig Dispense Refill  . dextromethorphan-guaiFENesin (MUCINEX DM) 30-600 MG per 12 hr tablet Take 1 tablet by mouth 2 (two) times daily as needed for cough.    . dicyclomine (BENTYL) 20 MG tablet Take 1 tablet (20 mg total) by mouth 4 (four) times daily -  before meals and at bedtime. (Patient taking differently: Take 20 mg by mouth as needed. ) 60 tablet 0  . DIGESTIVE ENZYMES PO Take 500 mg by mouth daily as needed (digestion).     . fluticasone (FLONASE) 50 MCG/ACT nasal spray Place 1 spray into both nostrils daily. (Patient taking differently: Place 1 spray into both nostrils as needed. ) 16 g 3  . loratadine (CLARITIN) 10 MG tablet Take 10 mg by mouth daily as needed.     Marland Kitchen. azithromycin (ZITHROMAX) 250 MG tablet Take 2 tablets PO on day 1, then 1 tablet PO daily for 4 days after 6 tablet 0   No facility-administered medications prior to visit.    Past Medical History:  Diagnosis Date  . Allergy    SEASONAL  . Chronic neck pain   . Colon polyps 2010  . Endometriosis   . Headache   . Hemochromatosis,  hereditary (HCC)   . IBS (irritable bowel syndrome)   . Urinary tract infection    Past Surgical History:  Procedure Laterality Date  . ABDOMINAL HYSTERECTOMY  partial   . LAPAROSCOPIC ENDOMETRIOSIS FULGURATION    . SHOULDER SURGERY  2015   right    Social History   Social History  . Marital status: Married    Spouse name: N/A  . Number of children: 1  . Years of education: N/A   Occupational History  . Environmental health practitionerAdministrative Assistant    Social History Main Topics  . Smoking status: Never Smoker  . Smokeless tobacco: Never Used  . Alcohol use No  . Drug use: No  . Sexual activity: Yes   Other Topics Concern  . None   Social History Narrative   Originally from [place]. Lived in [place] most of her life. Has lived in [place] since 1997. 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 Has 1 adopted daughter and 3 step-sons.    Family History  Problem Relation Age of Onset  . Adopted: Yes  . Family history unknown: Yes       Physical Exam: General: Well developed, well nourished, no acute distress Head: Normocephalic and atraumatic Eyes:  sclerae anicteric, EOMI Ears: Normal auditory acuity Mouth: No deformity or lesions Lungs: Clear throughout to auscultation Heart: Regular rate and rhythm; no murmurs, rubs or  bruits Abdomen: Soft, mild generalized tenderness and non distended. No masses, hepatosplenomegaly or hernias noted. Normal Bowel sounds Musculoskeletal: Symmetrical with no gross deformities  Pulses:  Normal pulses noted Extremities: No clubbing, cyanosis, edema or deformities noted Neurological: Alert oriented x 4, grossly nonfocal Psychological:  Alert and cooperative. Normal mood and affect  Assessment and Recommendations:  1. IBS with alternating bowel pattern however constipation seems to be the primary driver of symptoms. CBC, CMP, TSH today. Begin MiraLAX once or twice daily titrated to achieve a regular daily scheduled dosing to have a formed  bowel movement every day or every other day. Dicyclomine 10 mg 3 times a day when necessary. Patient is advised to call if her symptoms have not substantially improved within the next 2-3 weeks.  2. Weight loss. Likely related to her substantial change in diet. Blood work as above. She is advised to call if her weight decreases below 105 pounds.  3. Personal history of adenomatous colon polyps. Five-year interval surveillance colonoscopy is recommended in November 2015.

## 2016-11-15 NOTE — Patient Instructions (Addendum)
Your physician has requested that you go to the basement for lab work before leaving today.  You can take over the counter Miralax mixing 17 grams in 8 oz of water 1-2 x daily to every other day to titrate depending on bowel movements.   Thank you for choosing me and [facility].  Venita LickMalcolm T. Pleas KochStark, Jr., MD., Clementeen GrahamFACG

## 2016-12-01 ENCOUNTER — Ambulatory Visit: Payer: BLUE CROSS/BLUE SHIELD

## 2016-12-18 ENCOUNTER — Telehealth: Payer: Self-pay | Admitting: Physician Assistant

## 2016-12-18 NOTE — Telephone Encounter (Signed)
Note from [facility] Patient dxd with influenza with respiratory manifestation other than pneumonia.  No labs performed.  Given Tamiflu.

## 2017-01-02 ENCOUNTER — Ambulatory Visit (INDEPENDENT_AMBULATORY_CARE_PROVIDER_SITE_OTHER): Payer: BLUE CROSS/BLUE SHIELD | Admitting: Physician Assistant

## 2017-01-02 ENCOUNTER — Encounter: Payer: Self-pay | Admitting: Physician Assistant

## 2017-01-02 VITALS — BP 90/58 | HR 67 | Temp 98.3°F | Resp 16 | Ht 61.0 in | Wt 108.0 lb

## 2017-01-02 DIAGNOSIS — M79671 Pain in right foot: Secondary | ICD-10-CM

## 2017-01-02 DIAGNOSIS — Z124 Encounter for screening for malignant neoplasm of cervix: Secondary | ICD-10-CM

## 2017-01-02 DIAGNOSIS — Z13220 Encounter for screening for lipoid disorders: Secondary | ICD-10-CM

## 2017-01-02 DIAGNOSIS — Z0000 Encounter for general adult medical examination without abnormal findings: Secondary | ICD-10-CM | POA: Diagnosis not present

## 2017-01-02 DIAGNOSIS — M79672 Pain in left foot: Secondary | ICD-10-CM

## 2017-01-02 DIAGNOSIS — Z114 Encounter for screening for human immunodeficiency virus [HIV]: Secondary | ICD-10-CM | POA: Diagnosis not present

## 2017-01-02 DIAGNOSIS — N941 Unspecified dyspareunia: Secondary | ICD-10-CM

## 2017-01-02 DIAGNOSIS — Z8742 Personal history of other diseases of the female genital tract: Secondary | ICD-10-CM

## 2017-01-02 DIAGNOSIS — Z1389 Encounter for screening for other disorder: Secondary | ICD-10-CM

## 2017-01-02 DIAGNOSIS — Z1321 Encounter for screening for nutritional disorder: Secondary | ICD-10-CM

## 2017-01-02 DIAGNOSIS — N942 Vaginismus: Secondary | ICD-10-CM

## 2017-01-02 DIAGNOSIS — R079 Chest pain, unspecified: Secondary | ICD-10-CM | POA: Diagnosis not present

## 2017-01-02 LAB — POCT URINALYSIS DIP (MANUAL ENTRY)
Bilirubin, UA: NEGATIVE
Glucose, UA: NEGATIVE
Ketones, POC UA: NEGATIVE
Leukocytes, UA: NEGATIVE
NITRITE UA: NEGATIVE
PH UA: 7.5
Protein Ur, POC: NEGATIVE
RBC UA: NEGATIVE
SPEC GRAV UA: 1.015
UROBILINOGEN UA: 0.2

## 2017-01-02 NOTE — Progress Notes (Signed)
Patient ID: Ruth Harrison, female    DOB: [birth year], 51 y.o.   MRN: [id number]  PCP: Lucilla Edin, MD  Chief Complaint  Patient presents with  . Annual Exam    pap    Subjective:   Presents for Ruth Harrison.   Cervical Cancer Screening: s/p partial hysterectomy for endometriosis. Cervix and RIGHT ovary remain. Last pap 2014 was normal. Breast Cancer Screening: 11/14/2016. Dense breasts, but no findings suspicious of malignancy. Colorectal Cancer Screening: 2015, repeat in 2020 Bone Density Testing: not yet HIV Screening: not yet. Will do today. Very low risk. STI Screening: very low risk Seasonal Influenza Vaccination: current Td/Tdap Vaccination: Tdap 05/2010 Pneumococcal Vaccination: not yet a candidate Zoster Vaccination: not yet a candidate Frequency of Dental evaluation: Q6 months Frequency of Eye evaluation: annually  Hemochromatosis managed by oncology. Next visit 01/2017.  Vitamin D insufficiency. Desires recheck.  Desires specialist evaluation of bilateral foot pain. Symptoms x several years with walking long distances. Has tried OTC shoe inserts and different shoes without relief.   Intentional 24 lb weight loss by cutting out sugar and bread. Her husband has diabetes and has been working on [employer].  Chest pain x 1-2 months. Pressure. Left sided. No associated SOB, nausea, fatigue. No radiating pain. Some association with eating and with getting excited in church with singing. No change with movement. No tender. History of palpitations, evaluated in 2011. This is different.    Patient Active Problem List   Diagnosis Date Noted  . Acute sinusitis 09/19/2016  . OSA on CPAP 06/16/2016  . Obstructive sleep apnea 10/25/2014  . Hx of adenomatous colonic polyps 10/20/2014  . IBS (irritable bowel syndrome) 09/23/2014  . Hematochezia 09/23/2014  . Anal fissure 09/23/2014  . Constipation 07/23/2012  . Hemochromatosis 01/31/2012  .  PALPITATIONS 07/11/2010  . CHEST PAIN-UNSPECIFIED 07/11/2010    Past Medical History:  Diagnosis Date  . Allergy    SEASONAL  . Chronic neck pain   . Colon polyps 2010  . Endometriosis   . Headache   . Hemochromatosis, hereditary (HCC)   . IBS (irritable bowel syndrome)   . Urinary tract infection      Prior to Admission medications   Medication Sig Start Date End Date Taking? Authorizing Provider  Ascorbic Acid (VITAMIN C) 1000 MG tablet Take 1,000 mg by mouth 2 (two) times daily.   Yes Historical Provider, MD  dextromethorphan-guaiFENesin (MUCINEX DM) 30-600 MG per 12 hr tablet Take 1 tablet by mouth 2 (two) times daily as needed for cough.   Yes Historical Provider, MD  dicyclomine (BENTYL) 20 MG tablet Take 1 tablet (20 mg total) by mouth 4 (four) times daily -  before meals and at bedtime. Patient taking differently: Take 20 mg by mouth as needed.  07/11/16  Yes Wallis Bamberg, PA-C  DIGESTIVE ENZYMES PO Take 500 mg by mouth daily as needed (digestion).    Yes Historical Provider, MD  fluticasone (FLONASE) 50 MCG/ACT nasal spray Place 1 spray into both nostrils daily. Patient taking differently: Place 1 spray into both nostrils as needed.  06/16/16  Yes Roslynn Amble, MD  Zinc 50 MG TABS Take 50 mg by mouth 2 (two) times daily.   Yes Historical Provider, MD  loratadine (CLARITIN) 10 MG tablet Take 10 mg by mouth daily as needed.     Historical Provider, MD    Allergies  Allergen Reactions  . Oxycodone Hives  . Penicillins Itching  . Prednisone Shortness  Of Breath    Chest pain and double vision  . Valacyclovir Hcl Hives  . Sulfa Antibiotics Other (See Comments)    Unknown reaction not anyphalaxis    Past Surgical History:  Procedure Laterality Date  . ABDOMINAL HYSTERECTOMY  partial   . LAPAROSCOPIC ENDOMETRIOSIS FULGURATION    . SHOULDER SURGERY  2015   right     Family History  Problem Relation Age of Onset  . Adopted: Yes  . Family history unknown: Yes     Social History   Social History  . Marital status: Married    Spouse name: N/A  . Number of children: 1  . Years of education: N/A   Occupational History  . 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 Smoking status: Never Smoker  . Smokeless tobacco: Never Used  . Alcohol use No  . Drug use: No  . Sexual activity: Yes   Other Topics Concern  . None   Social History Narrative   Originally from [place]. Lived in [place] most of her life. Has lived in [place] since 1997. She is an Environmental health practitioner for [employer]. She is married. Has 1 adopted daughter and 3 step-sons.        Review of Systems  Constitutional: Negative.   HENT: Negative.   Eyes: Negative.   Respiratory: Negative.   Cardiovascular: Positive for chest pain. Negative for palpitations and leg swelling.  Gastrointestinal: Negative.   Endocrine: Negative.   Genitourinary: Positive for dyspareunia (infrequent sex as a result). Negative for decreased urine volume, difficulty urinating, dysuria, enuresis, flank pain, frequency, genital sores, hematuria, menstrual problem, pelvic pain, urgency, vaginal bleeding, vaginal discharge and vaginal pain.  Musculoskeletal: Positive for gait problem. Negative for arthralgias, back pain, joint swelling, myalgias, neck pain and neck stiffness.  Skin: Negative.   Allergic/Immunologic: Negative.   Neurological: Negative for dizziness, tremors, seizures, syncope, facial asymmetry, speech difficulty, weakness, light-headedness, numbness and headaches.  Hematological: Negative.   Psychiatric/Behavioral: Negative.         Objective:  Physical Exam  Constitutional: She is oriented to person, place, and time. Vital signs are normal. She appears well-developed and well-nourished. She is active and cooperative. No distress.  BP (!) 90/58   Pulse 67   Temp 98.3 F (36.8 C) (Oral)   Resp 16   Ht 5\' 1"  (1.549 m)   Wt 108 lb (49 kg)   SpO2 100%   BMI  20.41 kg/m    HENT:  Head: Normocephalic and atraumatic.  Right Ear: Hearing, tympanic membrane, external ear and ear canal normal. No foreign bodies.  Left Ear: Hearing, tympanic membrane, external ear and ear canal normal. No foreign bodies.  Nose: Nose normal.  Mouth/Throat: Uvula is midline, oropharynx is clear and moist and mucous membranes are normal. No oral lesions. Normal dentition. No dental abscesses or uvula swelling. No oropharyngeal exudate.  Eyes: Conjunctivae, EOM and lids are normal. Pupils are equal, round, and reactive to light. Right eye exhibits no discharge. Left eye exhibits no discharge. No scleral icterus.  Fundoscopic exam:      The right eye shows no arteriolar narrowing, no AV nicking, no exudate, no hemorrhage and no papilledema.       The left eye shows no arteriolar narrowing, no AV nicking, no exudate, no hemorrhage and no papilledema.  Neck: Trachea normal, normal range of motion and full passive range of motion without pain. Neck supple. No spinous process tenderness and no muscular  tenderness present. No thyroid mass and no thyromegaly present.  Cardiovascular: Normal rate, regular rhythm, normal heart sounds, intact distal pulses and normal pulses.   Pulmonary/Chest: Effort normal and breath sounds normal. She exhibits no tenderness and no retraction. Right breast exhibits no inverted nipple, no mass, no nipple discharge, no skin change and no tenderness. Left breast exhibits no inverted nipple, no mass, no nipple discharge, no skin change and no tenderness. Breasts are symmetrical.  Abdominal: Soft. Normal appearance and bowel sounds are normal. She exhibits no distension and no mass. There is no hepatosplenomegaly. There is no tenderness. There is no rigidity, no rebound, no guarding, no CVA tenderness, no tenderness at McBurney's point and negative Murphy's sign. No hernia. Hernia confirmed negative in the right inguinal area and confirmed negative in the left  inguinal area.  Genitourinary: Vagina normal and uterus normal. Rectal exam shows no external hemorrhoid and no fissure. No breast swelling, tenderness, discharge or bleeding. Pelvic exam was performed with patient supine. No labial fusion. There is no rash, tenderness, lesion or injury on the right labia. There is no rash, tenderness, lesion or injury on the left labia. Cervix exhibits no motion tenderness, no discharge and no friability. Right adnexum displays no mass, no tenderness and no fullness. Left adnexum displays no mass, no tenderness and no fullness. No erythema, tenderness or bleeding in the vagina. No foreign body in the vagina. No signs of injury around the vagina. No vaginal discharge found.  Genitourinary Comments: Significant pain with insertion and expansion of the speculum.   Musculoskeletal: She exhibits no edema or tenderness.       Cervical back: Normal.       Thoracic back: Normal.       Lumbar back: Normal.  Lymphadenopathy:       Head (right side): No tonsillar, no preauricular, no posterior auricular and no occipital adenopathy present.       Head (left side): No tonsillar, no preauricular, no posterior auricular and no occipital adenopathy present.    She has no cervical adenopathy.    She has no axillary adenopathy.       Right: No inguinal and no supraclavicular adenopathy present.       Left: No inguinal and no supraclavicular adenopathy present.  Neurological: She is alert and oriented to person, place, and time. She has normal strength and normal reflexes. No cranial nerve deficit. She exhibits normal muscle tone. Coordination and gait normal.  Skin: Skin is warm, dry and intact. No rash noted. She is not diaphoretic. No cyanosis or erythema. Nails show no clubbing.  Psychiatric: She has a normal mood and affect. Her speech is normal and behavior is normal. Judgment and thought content normal.    Wt Readings from Last 3 Encounters:  01/02/17 108 lb (49 kg)    11/15/16 108 lb 4 oz (49.1 kg)  09/19/16 112 lb 9.6 oz (51.1 kg)   EKG reviewed with Dr. Katrinka BlazingSmith. NSR. No ischemic changes.    Assessment & Plan:  1. Annual physical exam Age appropriate anticipatory guidance provided.  2. Screening for HIV (human immunodeficiency virus) Very low risk. - HIV antibody  3. Hemochromatosis, unspecified hemochromatosis type Stable. Managed by oncology.  4. Chest pain, unspecified type Likely GI related, but recommend cardiology evaluation. - EKG 12-Lead - Ambulatory referral to Cardiology  5. Screening for lipid disorders - Lipid panel  6. Screening for blood or protein in urine - POCT urinalysis dipstick  7. Encounter for vitamin deficiency screening  Has remained low, despite supplementation. - VITAMIN D 25 Hydroxy (Vit-D Deficiency, Fractures)  8. Screening for cervical cancer If cytology and HPV both normal/negative, repeat cotesting in 5 years. - Pap IG and HPV (high risk) DNA detection  9. Bilateral foot pain Suspect plantar fasciitis. May benefit from custom inserts. 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 Female dyspareunia Largely ignored by patient. Despite pain, continues to engage in intercourse with her husband, who she describes as gentle and understanding on this issue. - Ambulatory referral to Physical Therapy  12. History of endometriosis S/p supracervical hysterectomy.   Fernande Bras, PA-C Physician Assistant-Certified Primary Care at [facility]

## 2017-01-02 NOTE — Patient Instructions (Addendum)
   IF you received an x-ray today, you will receive an invoice from [facility]. Please contact [facility] at [phone] with questions or concerns regarding your invoice.   IF you received labwork today, you will receive an invoice from [facility]. Please contact [facility] at [phone] with questions or concerns regarding your invoice.   Our billing staff will not be able to assist you with questions regarding bills from these companies.  You will be contacted with the lab results as soon as they are available. The fastest way to get your results is to activate your My Chart account. Instructions are located on the last page of this paperwork. If you have not heard from us regarding the results in 2 weeks, please contact this office.     Keeping You Healthy  Get These Tests  Blood Pressure- Have your blood pressure checked by your healthcare provider at least once a year.  Normal blood pressure is 120/80.  Weight- Have your body mass index (BMI) calculated to screen for obesity.  BMI is a measure of body fat based on height and weight.  You can calculate your own BMI at www.nhlbisupport.com/bmi/  Cholesterol- Have your cholesterol checked every year.  Diabetes- Have your blood sugar checked every year if you have high blood pressure, high cholesterol, a family history of diabetes or if you are overweight.  Pap Test - Have a pap test every 1 to 5 years if you have been sexually active.  If you are older than 65 and recent pap tests have been normal you may not need additional pap tests.  In addition, if you have had a hysterectomy  for benign disease additional pap tests are not necessary.  Mammogram-Yearly mammograms are essential for early detection of breast cancer  Screening for Colon Cancer- Colonoscopy starting at age 50. Screening may begin sooner depending on your family history and other health conditions.  Follow up colonoscopy as directed by your  Gastroenterologist.  Screening for Osteoporosis- Screening begins at age 65 with bone density scanning, sooner if you are at higher risk for developing Osteoporosis.  Get these medicines  Calcium with Vitamin D- Your body requires 1200-1500 mg of Calcium a day and 800-1000 IU of Vitamin D a day.  You can only absorb 500 mg of Calcium at a time therefore Calcium must be taken in 2 or 3 separate doses throughout the day.  Hormones- Hormone therapy has been associated with increased risk for certain cancers and heart disease.  Talk to your healthcare provider about if you need relief from menopausal symptoms.  Aspirin- Ask your healthcare provider about taking Aspirin to prevent Heart Disease and Stroke.  Get these Immuniztions  Flu shot- Every fall  Pneumonia shot- Once after the age of 65; if you are younger ask your healthcare provider if you need a pneumonia shot.  Tetanus- Every ten years.  Zostavax- Once after the age of 60 to prevent shingles.  Take these steps  Don't smoke- Your healthcare provider can help you quit. For tips on how to quit, ask your healthcare provider or go to www.smokefree.gov or call 1-800 QUIT-NOW.  Be physically active- Exercise 5 days a week for a minimum of 30 minutes.  If you are not already physically active, start slow and gradually work up to 30 minutes of moderate physical activity.  Try walking, dancing, bike riding, swimming, etc.  Eat a healthy diet- Eat a variety of healthy foods such as fruits, vegetables, whole grains, low   fat milk, low fat cheeses, yogurt, lean meats, chicken, fish, eggs, dried beans, tofu, etc.  For more information go to www.thenutritionsource.org  Dental visit- Brush and floss teeth twice daily; visit your dentist twice a year.  Eye exam- Visit your Optometrist or Ophthalmologist yearly.  Drink alcohol in moderation- Limit alcohol intake to one drink or less a day.  Never drink and drive.  Depression- Your emotional  health is as important as your physical health.  If you're feeling down or losing interest in things you normally enjoy, please talk to your healthcare provider.  Seat Belts- can save your life; always wear one  Smoke/Carbon Monoxide detectors- These detectors need to be installed on the appropriate level of your home.  Replace batteries at least once a year.  Violence- If anyone is threatening or hurting you, please tell your healthcare provider.  Living Will/ Health care power of attorney- Discuss with your healthcare provider and family.  

## 2017-01-02 NOTE — Progress Notes (Signed)
Patient ID: Ruth Harrison, female    DOB: 29-Jan-1966, 51 y.o.   MRN: [id number]  PCP: Ruth Edin, MD  Chief Complaint  Patient presents with  . Annual Exam    pap    Subjective:   Presents for Ruth Harrison.  Pt is a 51yo caucasian female who presents for annual wellness visit. Pt denies any recent change in medications. Admits to 24 lb weight loss on purpose by cutting out sugar and bread from her diet.  Pt requests a vitamin D lab today. 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 She would like to see if she should start taking the supplement again.  Pt requests advice concerning recent mammogram results. Pt states that she was told her mammogram was difficult to read because of dense breast tissue. Pt asks if further testing is needed.  Pt requests referral to foot specialist for chronic bilateral foot pain x several years brought on my walking long distances. She is planning a trip to [place] in April. She has tried different types of shoes and insoles with no relief.  Pt also complains of left chest pain and pressure x 2 months. She believes that the pressure is from indigestion. Pain is worse with eating and singing/getting excited in church. Not always associated with food. Not brought on my movement or palpation. Different than palpitations experienced in 07/2010. Denies pain radiation. Denies associated nausea or vomiting.  Cervical Cancer Screening: Will do today. Breast Cancer Screening: 11/14/16 Colorectal Cancer Screening: 10/23/14 HIV Screening: Will do today. STI Screening: Pt declines. No risk factors. Seasonal Influenza Vaccination: 08/15/16 Td/Tdap Vaccination: 05/16/10 Frequency of Dental evaluation: Q6 months Frequency of Eye evaluation: Once/year    Patient Active Problem List   Diagnosis Date Noted  . Acute sinusitis 09/19/2016  . OSA on CPAP 06/16/2016  .  Obstructive sleep apnea 10/25/2014  . Hx of adenomatous colonic polyps 10/20/2014  . IBS (irritable bowel syndrome) 09/23/2014  . Hematochezia 09/23/2014  . Anal fissure 09/23/2014  . Constipation 07/23/2012  . Hemochromatosis 01/31/2012  . PALPITATIONS 07/11/2010  . CHEST PAIN-UNSPECIFIED 07/11/2010    Past Medical History:  Diagnosis Date  . Allergy    SEASONAL  . Chronic neck pain   . Colon polyps 2010  . Endometriosis   . Headache   . Hemochromatosis, hereditary (HCC)   . IBS (irritable bowel syndrome)   . Urinary tract infection      Prior to Admission medications   Medication Sig Start Date End Date Taking? Authorizing Provider  Ascorbic Acid (VITAMIN C) 1000 MG tablet Take 1,000 mg by mouth 2 (two) times daily.   Yes Historical Provider, MD  dextromethorphan-guaiFENesin (MUCINEX DM) 30-600 MG per 12 hr tablet Take 1 tablet by mouth 2 (two) times daily as needed for cough.   Yes Historical Provider, MD  dicyclomine (BENTYL) 20 MG tablet Take 1 tablet (20 mg total) by mouth 4 (four) times daily -  before meals and at bedtime. Patient taking differently: Take 20 mg by mouth as needed.  07/11/16  Yes Wallis Bamberg, PA-C  DIGESTIVE ENZYMES PO Take 500 mg by mouth daily as needed (digestion).    Yes Historical Provider, MD  fluticasone (FLONASE) 50 MCG/ACT nasal spray Place 1 spray into both nostrils daily. Patient taking differently: Place 1 spray into both nostrils as needed.  06/16/16  Yes Roslynn Amble,  MD  Zinc 50 MG TABS Take 50 mg by mouth 2 (two) times daily.   Yes Historical Provider, MD  loratadine (CLARITIN) 10 MG tablet Take 10 mg by mouth daily as needed.     Historical Provider, MD    Allergies  Allergen Reactions  . Oxycodone Hives  . Penicillins Itching  . Prednisone Shortness Of Breath    Chest pain and double vision  . Valacyclovir Hcl Hives  . Sulfa Antibiotics Other (See Comments)    Unknown reaction not anyphalaxis    Past Surgical History:    Procedure Laterality Date  . ABDOMINAL HYSTERECTOMY  partial   . LAPAROSCOPIC ENDOMETRIOSIS FULGURATION    . SHOULDER SURGERY  2015   right     Family History  Problem Relation Age of Onset  . Adopted: Yes  . Family history unknown: Yes    Social History   Social History  . Marital status: Married    Spouse name: N/A  . Number of children: 1  . 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 Smoking status: Never Smoker  . Smokeless tobacco: Never Used  . Alcohol use No  . Drug use: No  . Sexual activity: Yes   Other Topics Concern  . None   Social History Narrative   Originally from [place]. Lived in [place] most of her life. Has lived in [place] since 1997. Has 1 adopted daughter and 3 step-sons.      Review of Systems In addition to that mentioned in HPI above: Const: Denies fever, chills, or fatigue. HEENT: Denies changes in vision. Pulm: Denies cough or SOB. CV: Denies palpations.  Abd: Denies abdominal pain, nausea, vomiting, diarrhea, or constipation.     Objective:  Physical Exam HEENT: PERRLA. Throat nonerythematous, no exudates. Left ear canal clear, right ear canal has scant blood from mechanical trauma (Q-tip), TMs intact bilaterally. No lymphadenopathy, nontender. Pulm: Good respiratory effort. CTAB. No wheezes, rales, or rhonchi. CV: RRR. No M/R/G. Abd: Soft, nontender, nondistended. + BS x 4 quadrants. Neuro: A&O x 3.  Psych: Proper affect.  Pelvic Exam: Exam not tolerated well due to pain. Considerable pain with speculum insertion and opening. Normal external female genitalia. No discolorations or gross abnormalities. Cervix is pink, no discharge noted, OS closed. Mild diffuse tenderness to palpation, No masses noted.      Assessment & Plan:  1. Annual physical exam Pt appears well pending labs.   2. Screening for HIV (human immunodeficiency virus) No known risk factors.  Pending screening lab result. - HIV antibody  3. Hemochromatosis, unspecified hemochromatosis type Normal CBC on 11/15/16. Follow up with oncology as planned in February.   4. Chest pain, unspecified type EKG shows NSR  - EKG 12-Lead - Ambulatory referral to Cardiology  5. Screening for lipid disorders - Lipid panel  6. Screening for blood or protein in urine - POCT urinalysis dipstick  7. Encounter for vitamin deficiency screening Minimally low per lab on 10/05/14. No vit D supplement last 2 years. Pt requests repeat lab.  - VITAMIN D 25 Hydroxy (Vit-D Deficiency, Fractures)  8. Screening for cervical cancer - Pap IG and HPV (high risk) DNA detection  9. 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 Female dyspareunia Has caused considerable distress during pelvic exams and sex.  - Ambulatory referral to Physical Therapy  12. History of endometriosis Partial Hysterectomy (Sparing single  ovary and cervix).  Georgiana Spinner, PA-S

## 2017-01-03 LAB — HIV ANTIBODY (ROUTINE TESTING W REFLEX): HIV Screen 4th Generation wRfx: NONREACTIVE

## 2017-01-03 LAB — VITAMIN D 25 HYDROXY (VIT D DEFICIENCY, FRACTURES): Vit D, 25-Hydroxy: 25.3 ng/mL — ABNORMAL LOW (ref 30.0–100.0)

## 2017-01-03 LAB — LIPID PANEL
CHOLESTEROL TOTAL: 164 mg/dL (ref 100–199)
Chol/HDL Ratio: 2.3 ratio units (ref 0.0–4.4)
HDL: 71 mg/dL (ref >39)
LDL Calculated: 83 mg/dL (ref 0–99)
Triglycerides: 49 mg/dL (ref 0–149)
VLDL CHOLESTEROL CAL: 10 mg/dL (ref 5–40)

## 2017-01-04 ENCOUNTER — Encounter: Payer: Self-pay | Admitting: Physician Assistant

## 2017-01-04 DIAGNOSIS — E559 Vitamin D deficiency, unspecified: Secondary | ICD-10-CM | POA: Insufficient documentation

## 2017-01-05 LAB — PAP IG AND HPV HIGH-RISK
HPV, HIGH-RISK: NEGATIVE
PAP SMEAR COMMENT: 0

## 2017-01-06 ENCOUNTER — Telehealth: Payer: Self-pay

## 2017-01-06 NOTE — Telephone Encounter (Signed)
Called and left a message with new md time due to pal day   Roberta Angell

## 2017-01-08 ENCOUNTER — Other Ambulatory Visit (HOSPITAL_BASED_OUTPATIENT_CLINIC_OR_DEPARTMENT_OTHER): Payer: BLUE CROSS/BLUE SHIELD

## 2017-01-08 LAB — COMPREHENSIVE METABOLIC PANEL
ALBUMIN: 4.2 g/dL (ref 3.5–5.0)
ALK PHOS: 55 U/L (ref 40–150)
ALT: 30 U/L (ref 0–55)
AST: 21 U/L (ref 5–34)
Anion Gap: 7 mEq/L (ref 3–11)
BILIRUBIN TOTAL: 0.4 mg/dL (ref 0.20–1.20)
BUN: 13 mg/dL (ref 7.0–26.0)
CO2: 28 meq/L (ref 22–29)
CREATININE: 0.7 mg/dL (ref 0.6–1.1)
Calcium: 9.6 mg/dL (ref 8.4–10.4)
Chloride: 105 mEq/L (ref 98–109)
GLUCOSE: 95 mg/dL (ref 70–140)
Potassium: 4.2 mEq/L (ref 3.5–5.1)
SODIUM: 141 meq/L (ref 136–145)
TOTAL PROTEIN: 7.6 g/dL (ref 6.4–8.3)

## 2017-01-08 LAB — CBC WITH DIFFERENTIAL/PLATELET
BASO%: 0.2 % (ref 0.0–2.0)
Basophils Absolute: 0 10*3/uL (ref 0.0–0.1)
EOS ABS: 0.1 10*3/uL (ref 0.0–0.5)
EOS%: 1 % (ref 0.0–7.0)
HEMATOCRIT: 40.3 % (ref 34.8–46.6)
HGB: 13.2 g/dL (ref 11.6–15.9)
LYMPH#: 1.7 10*3/uL (ref 0.9–3.3)
LYMPH%: 34.2 % (ref 14.0–49.7)
MCH: 32.2 pg (ref 25.1–34.0)
MCHC: 32.8 g/dL (ref 31.5–36.0)
MCV: 98.3 fL (ref 79.5–101.0)
MONO#: 0.5 10*3/uL (ref 0.1–0.9)
MONO%: 9.7 % (ref 0.0–14.0)
NEUT#: 2.8 10*3/uL (ref 1.5–6.5)
NEUT%: 54.9 % (ref 38.4–76.8)
Platelets: 171 10*3/uL (ref 145–400)
RBC: 4.1 10*6/uL (ref 3.70–5.45)
RDW: 12.9 % (ref 11.2–14.5)
WBC: 5 10*3/uL (ref 3.9–10.3)

## 2017-01-09 LAB — IRON AND TIBC
%SAT: 46 % (ref 21–57)
Iron: 122 ug/dL (ref 41–142)
TIBC: 265 ug/dL (ref 236–444)
UIBC: 143 ug/dL (ref 120–384)

## 2017-01-09 LAB — FERRITIN: Ferritin: 41 ng/ml (ref 9–269)

## 2017-01-11 ENCOUNTER — Encounter: Payer: Self-pay | Admitting: Internal Medicine

## 2017-01-11 ENCOUNTER — Telehealth: Payer: Self-pay | Admitting: Internal Medicine

## 2017-01-11 ENCOUNTER — Ambulatory Visit (HOSPITAL_BASED_OUTPATIENT_CLINIC_OR_DEPARTMENT_OTHER): Payer: BLUE CROSS/BLUE SHIELD | Admitting: Internal Medicine

## 2017-01-11 NOTE — Telephone Encounter (Signed)
Appointments scheduled per 01/11/17 los. Patient was given a copy of the AVS report and appointment schedule per 01/11/17 los. °

## 2017-01-11 NOTE — Progress Notes (Signed)
[facility] Telephone:([phone]) [phone]   Fax:([phone]  OFFICE PROGRESS NOTE  [name], PA-C [address] [place] [address]  DIAGNOSIS: Hereditary hemochromatosis with homozygous C282Y mutation.   PRIOR THERAPY: Weekly phlebotomies x3, last one was in January of 2014.   CURRENT THERAPY: Observation.  INTERVAL HISTORY: Ruth Harrison 51 y.o. female came to the clinic today for six-month follow-up visit. The patient is feeling fine with no specific complaints. She denied having any shortness of breath, cough or hemoptysis. She had intermittent chest pain and she had an EKG that was unremarkable and she was referred to cardiology for evaluation. She denied having any fatigue or weakness. She continues to have mild pain in the finger. She has no nausea, vomiting, diarrhea or constipation. She intentionally lost more than 20 pounds recently by cutting on carbohydrate. She had repeat CBC, comprehensive metabolic panel, iron study and ferritin performed recently and she is here for evaluation and discussion of her lab results.  MEDICAL HISTORY: Past Medical History:  Diagnosis Date  . Allergy    SEASONAL  . Chronic neck pain   . Colon polyps 2010  . Endometriosis   . Headache   . Hemochromatosis, hereditary (HCC)   . IBS (irritable bowel syndrome)   . Urinary tract infection     ALLERGIES:  is allergic to oxycodone; penicillins; prednisone; valacyclovir hcl; and sulfa antibiotics.  MEDICATIONS:  Current Outpatient Prescriptions  Medication Sig Dispense Refill  . Ascorbic Acid (VITAMIN C) 1000 MG tablet Take 1,000 mg by mouth 2 (two) times daily.    Marland Kitchen dextromethorphan-guaiFENesin (MUCINEX DM) 30-600 MG per 12 hr tablet Take 1 tablet by mouth 2 (two) times daily as needed for cough.    . dicyclomine (BENTYL) 20 MG tablet Take 1 tablet (20 mg total) by mouth 4 (four) times daily -  before meals and at bedtime. (Patient taking differently: Take 20 mg by  mouth as needed. ) 60 tablet 0  . DIGESTIVE ENZYMES PO Take 500 mg by mouth daily as needed (digestion).     . fluticasone (FLONASE) 50 MCG/ACT nasal spray Place 1 spray into both nostrils daily. (Patient taking differently: Place 1 spray into both nostrils as needed. ) 16 g 3  . loratadine (CLARITIN) 10 MG tablet Take 10 mg by mouth daily as needed.     . Zinc 50 MG TABS Take 50 mg by mouth 2 (two) times daily.     No current facility-administered medications for this visit.     SURGICAL HISTORY:  Past Surgical History:  Procedure Laterality Date  . ABDOMINAL HYSTERECTOMY  partial   . LAPAROSCOPIC ENDOMETRIOSIS FULGURATION    . SHOULDER SURGERY  2015   right     REVIEW OF SYSTEMS:  A comprehensive review of systems was negative except for: Musculoskeletal: positive for arthralgias   PHYSICAL EXAMINATION: General appearance: alert, cooperative and no distress Head: Normocephalic, without obvious abnormality, atraumatic Neck: no adenopathy Lymph nodes: Cervical, supraclavicular, and axillary nodes normal. Resp: clear to auscultation bilaterally Back: symmetric, no curvature. ROM normal. No CVA tenderness. Cardio: regular rate and rhythm, S1, S2 normal, no murmur, click, rub or gallop GI: soft, non-tender; bowel sounds normal; no masses,  no organomegaly Extremities: extremities normal, atraumatic, no cyanosis or edema  ECOG PERFORMANCE STATUS: 1 - Symptomatic but completely ambulatory  Blood pressure 101/70, pulse 64, temperature 97.7 F (36.5 C), temperature source Oral, resp. rate 18, height 5\' 1"  (1.549 m), weight 111  lb 6.4 oz (50.5 kg), SpO2 99 %.  LABORATORY DATA: Lab Results  Component Value Date   WBC 5.0 01/08/2017   HGB 13.2 01/08/2017   HCT 40.3 01/08/2017   MCV 98.3 01/08/2017   PLT 171 01/08/2017      Chemistry      Component Value Date/Time   NA 141 01/08/2017 1553   K 4.2 01/08/2017 1553   CL 102 11/15/2016 1148   CL 103 08/15/2012 1600   CO2 28  01/08/2017 1553   BUN 13.0 01/08/2017 1553   CREATININE 0.7 01/08/2017 1553      Component Value Date/Time   CALCIUM 9.6 01/08/2017 1553   ALKPHOS 55 01/08/2017 1553   AST 21 01/08/2017 1553   ALT 30 01/08/2017 1553   BILITOT 0.40 01/08/2017 1553     Other lab results: Ferritin 41, serum iron 122, total iron binding capacity 265 and iron saturation 46%  RADIOGRAPHIC STUDIES: No results found.  ASSESSMENT AND PLAN:  This is a very pleasant 51 years old white female with hereditary hemochromatosis with homozygous C282Y mutation.  The patient underwent several phlebotomies in the past and currently on observation. Her serum ferritin is 41 today. There is no other significant abnormalities in her blood work. I recommended for the patient to continue on observation with repeat CBC, comprehensive metabolic panel, iron study and ferritin in 6 months. She was advised to call immediately if she has any concerning symptoms in the interval. All questions were answered. The patient knows to call the clinic with any problems, questions or concerns. We can certainly see the patient much sooner if necessary. I spent 10 minutes counseling the patient face to face. The total time spent in the appointment was 15 minutes.   Disclaimer: This note was dictated with voice recognition software. Similar sounding words can inadvertently be transcribed and may be missed upon review.

## 2017-01-15 ENCOUNTER — Ambulatory Visit: Payer: BLUE CROSS/BLUE SHIELD | Admitting: Podiatry

## 2017-01-16 ENCOUNTER — Ambulatory Visit: Payer: BLUE CROSS/BLUE SHIELD | Admitting: Internal Medicine

## 2017-01-22 ENCOUNTER — Ambulatory Visit: Payer: BLUE CROSS/BLUE SHIELD | Admitting: Physical Therapy

## 2017-01-26 DIAGNOSIS — M6702 Short Achilles tendon (acquired), left ankle: Secondary | ICD-10-CM | POA: Insufficient documentation

## 2017-01-26 DIAGNOSIS — M6701 Short Achilles tendon (acquired), right ankle: Secondary | ICD-10-CM | POA: Insufficient documentation

## 2017-01-26 DIAGNOSIS — M722 Plantar fascial fibromatosis: Secondary | ICD-10-CM | POA: Insufficient documentation

## 2017-02-07 ENCOUNTER — Ambulatory Visit (INDEPENDENT_AMBULATORY_CARE_PROVIDER_SITE_OTHER): Payer: BLUE CROSS/BLUE SHIELD | Admitting: Physician Assistant

## 2017-02-07 VITALS — BP 101/60 | HR 73 | Temp 97.8°F | Ht 61.0 in | Wt 112.6 lb

## 2017-02-07 DIAGNOSIS — M353 Polymyalgia rheumatica: Secondary | ICD-10-CM | POA: Diagnosis not present

## 2017-02-07 DIAGNOSIS — M25649 Stiffness of unspecified hand, not elsewhere classified: Secondary | ICD-10-CM | POA: Diagnosis not present

## 2017-02-07 LAB — POCT URINALYSIS DIP (MANUAL ENTRY)
Bilirubin, UA: NEGATIVE
Glucose, UA: NEGATIVE
Ketones, POC UA: NEGATIVE
Leukocytes, UA: NEGATIVE
Nitrite, UA: NEGATIVE
Protein Ur, POC: NEGATIVE
Spec Grav, UA: <=1.005
Urobilinogen, UA: 0.2
pH, UA: 5.5

## 2017-02-07 MED ORDER — METHYLPREDNISOLONE 4 MG PO TABS
4.0000 mg | ORAL_TABLET | Freq: Every day | ORAL | 0 refills | Status: DC
Start: 2017-02-07 — End: 2017-02-21

## 2017-02-07 NOTE — Patient Instructions (Addendum)
Please make an appointment to come see me on Monday -- sooner if you experience side effects.   Thank you for coming in today. I hope you feel we met your needs.  Feel free to call [facility] if you have any questions or further requests.  Please consider signing up for MyChart if you do not already have it, as this is a great way to communicate with me.  Best,  Whitney McVey, PA-C   Polymyalgia Rheumatica Polymyalgia rheumatica (PMR) is an inflammatory disorder that causes aching and stiffness in your muscles and joints. Sometimes, PMR leads to a more dangerous condition (temporal arteritis or giant cell arteritis), which can cause vision loss. What are the causes? The exact cause of PMR is not known. What increases the risk? This condition is more likely to develop in:  Females.  People who are 51 years of age or older.  Caucasians. What are the signs or symptoms?   Pain and stiffness are the main symptoms of PMR. Symptoms may start slowly or suddenly. The symptoms:  May be worse after inactivity and in the morning.  May affect your:  Hips, buttocks, and thighs.  Neck, arms, and shoulders. This can make it hard to raise your arms above your head.  Hands and wrists. Other symptoms include:  Fever.  Tiredness.  Weakness.  Decreased appetite. This may lead to weight loss. How is this diagnosed? This condition is diagnosed with a medical history and physical exam. You may need to see a health care provider who specializes in diseases of the joint, muscles, and bones (rheumatologist). You may also have tests, including:  Blood tests.  X-rays. How is this treated? PMR usually goes away without treatment, but it may take years for that to happen. In the meantime, your health care provider may recommend low-dose steroids to help manage your symptoms of pain and stiffness. Regular exercise and rest will also help your symptoms. Follow these instructions at home:  Take  over-the-counter and prescription medicines only as told by your health care provider.  Make sure to get enough rest and sleep.  Eat a healthy and nutritious diet.  Try to exercise most days of the week. Ask your health care provider what type of exercise is best for you.  Keep all follow-up visits as told by your health are provider. This is important. Contact a health care provider if:  Your symptoms are not controlled with medicine.  You have side effects from steroids. These may include:  Weight gain.  Swelling.  Insomnia.  Mood changes.  Bruising.  High blood sugar readings, if you have diabetes.  Higher than normal blood pressure readings, if you monitor your blood pressure. Get help right away if:  You develop symptoms of temporal arteritis, such as:  A change in vision.  Severe headache.  Scalp pain.  Jaw pain. This information is not intended to replace advice given to you by your health care provider. Make sure you discuss any questions you have with your health care provider. Document Released: 12/28/2004 Document Revised: 04/27/2016 Document Reviewed: 06/02/2015 Elsevier Interactive Patient Education  2017 [employer].  IF you received an x-ray today, you will receive an invoice from [facility]. Please contact [facility] at [phone] with questions or concerns regarding your invoice.   IF you received labwork today, you will receive an invoice from [place]. Please contact [facility] at [phone] with questions or concerns regarding your invoice.   Our billing staff will not be able  to assist you with questions regarding bills from these companies.  You will be contacted with the lab results as soon as they are available. The fastest way to get your results is to activate your My Chart account. Instructions are located on the last page of this paperwork. If you have not heard from [place] regarding the results in 2 weeks, please  contact this office.

## 2017-02-07 NOTE — Progress Notes (Signed)
Ruth Harrison  MRN: [id number] DOB: Apr 25, 1966  PCP: Porfirio Oarhelle Jeffery, PA-C  Subjective:  Pt is a 51 year old female PMH OSA, IBS, who presents to clinic for hand pain x 5 years.   C/o b/l hand pain in morning and with ADLs. +stiffness, weakness and decreased grip strength.  She has tried braces in the past, helped some.   Mostly hands is where she experiences stiffness. No pain with movement "they feel fine when I am typing" Symptoms worsen when she is not moving her hands. Feels better when she is active.   [facility]: No erosion, subluxation, periostitis or ankylosis; joint spaces are maintained; no fractures or malalignment.  Consult with orthopedic surgery today - Self-referral. Plan: Short course of NSAIDs and consult with rheumatology or PCP.  She has been evaluated in the past by rheumatology and told "not arthritis" and told she is fine and no other work-up is needed. She is tired of this pain and is frustrated at the lack of care she has received in the past.   Denies red/hot joints, hip pain, shoulder pain, elbow pain, skin changes, rash.   Review of Systems  Constitutional: Negative for chills, diaphoresis, fatigue and fever.  HENT: Negative for congestion, postnasal drip, rhinorrhea, sinus pressure, sneezing and sore throat.   Respiratory: Negative for cough, chest tightness, shortness of breath and wheezing.   Cardiovascular: Negative for chest pain and palpitations.  Gastrointestinal: Negative for abdominal pain, diarrhea, nausea and vomiting.  Musculoskeletal: Positive for arthralgias (b/l hands). Negative for gait problem, joint swelling and myalgias.  Neurological: Negative for weakness, light-headedness and headaches.    Patient Active Problem List   Diagnosis Date Noted  . Vitamin D insufficiency 01/04/2017  . History of endometriosis 01/02/2017  . Acute sinusitis 09/19/2016  . OSA on CPAP 06/16/2016  .  Obstructive sleep apnea 10/25/2014  . Hx of adenomatous colonic polyps 10/20/2014  . IBS (irritable bowel syndrome) 09/23/2014  . Hematochezia 09/23/2014  . Anal fissure 09/23/2014  . Constipation 07/23/2012  . Hemochromatosis 01/31/2012  . PALPITATIONS 07/11/2010  . CHEST PAIN-UNSPECIFIED 07/11/2010    Current Outpatient Prescriptions on File Prior to Visit  Medication Sig Dispense Refill  . Ascorbic Acid (VITAMIN C) 1000 MG tablet Take 1,000 mg by mouth 2 (two) times daily.    Marland Kitchen. dextromethorphan-guaiFENesin (MUCINEX DM) 30-600 MG per 12 hr tablet Take 1 tablet by mouth 2 (two) times daily as needed for cough.    . dicyclomine (BENTYL) 20 MG tablet Take 1 tablet (20 mg total) by mouth 4 (four) times daily -  before meals and at bedtime. (Patient taking differently: Take 20 mg by mouth as needed. ) 60 tablet 0  . DIGESTIVE ENZYMES PO Take 500 mg by mouth daily as needed (digestion).     . fluticasone (FLONASE) 50 MCG/ACT nasal spray Place 1 spray into both nostrils daily. (Patient taking differently: Place 1 spray into both nostrils as needed. ) 16 g 3  . loratadine (CLARITIN) 10 MG tablet Take 10 mg by mouth daily as needed.     . Zinc 50 MG TABS Take 50 mg by mouth 2 (two) times daily.     No current facility-administered medications on file prior to visit.     Allergies  Allergen Reactions  . Oxycodone Hives  . Penicillins Itching  . Prednisone Shortness Of Breath    Chest pain and double vision  . Valacyclovir Hcl  Hives  . Sulfa Antibiotics Other (See Comments)    Unknown reaction not anyphalaxis     Objective:  BP 101/60 (BP Location: Right Arm, Patient Position: Sitting, Cuff Size: Small)   Pulse 73   Temp 97.8 F (36.6 C) (Oral)   Ht 5\' 1"  (1.549 m)   Wt 112 lb 9.6 oz (51.1 kg)   SpO2 98%   BMI 21.28 kg/m   Physical Exam  Constitutional: She is oriented to person, place, and time and well-developed, well-nourished, and in no distress. No distress.    Cardiovascular: Normal rate, regular rhythm and normal heart sounds.   Musculoskeletal:       Right hand: She exhibits normal range of motion, no tenderness, no bony tenderness, normal capillary refill, no deformity and no swelling. Normal sensation noted. Normal strength noted.       Left hand: She exhibits normal range of motion, no tenderness, no bony tenderness, normal capillary refill, no deformity and no swelling. Normal sensation noted. Normal strength noted.  Neurological: She is alert and oriented to person, place, and time. GCS score is 15.  Skin: Skin is warm and dry.  Psychiatric: Mood, memory, affect and judgment normal.  Vitals reviewed.   Assessment and Plan :  1. Morning joint stiffness of hand, unspecified laterality 2. Polymyalgia rheumatica (HCC) - C-reactive protein - Sedimentation Rate - CBC with Differential/Platelet - POCT urinalysis dipstick - Pt has had multiple work-ups in the past few years by historical providers for her b/l hand pain. She has seen orthopedist and rheumatologist with no answers. Pain could possibly be from polymyalgia rheumatica, as she has not been treated for this in the past and her pain is relieved with activity. Labs are pending. Will contact pt with results and plan. She understands and agrees.   Marco Collie, PA-C  Primary Care at [facility] 02/07/2017 5:40 PM

## 2017-02-08 LAB — CBC WITH DIFFERENTIAL/PLATELET
Basophils Absolute: 0 10*3/uL (ref 0.0–0.2)
Basos: 0 %
EOS (ABSOLUTE): 0.1 10*3/uL (ref 0.0–0.4)
Eos: 1 %
Hematocrit: 38.2 % (ref 34.0–46.6)
Hemoglobin: 12.8 g/dL (ref 11.1–15.9)
Immature Grans (Abs): 0 10*3/uL (ref 0.0–0.1)
Immature Granulocytes: 0 %
Lymphocytes Absolute: 2.1 10*3/uL (ref 0.7–3.1)
Lymphs: 39 %
MCH: 32.4 pg (ref 26.6–33.0)
MCHC: 33.5 g/dL (ref 31.5–35.7)
MCV: 97 fL (ref 79–97)
Monocytes Absolute: 0.4 10*3/uL (ref 0.1–0.9)
Monocytes: 7 %
Neutrophils Absolute: 2.9 10*3/uL (ref 1.4–7.0)
Neutrophils: 53 %
Platelets: 189 10*3/uL (ref 150–379)
RBC: 3.95 x10E6/uL (ref 3.77–5.28)
RDW: 13 % (ref 12.3–15.4)
WBC: 5.5 10*3/uL (ref 3.4–10.8)

## 2017-02-08 LAB — C-REACTIVE PROTEIN: CRP: 3 mg/L (ref 0.0–4.9)

## 2017-02-08 LAB — SEDIMENTATION RATE: Sed Rate: 7 mm/h (ref 0–40)

## 2017-02-12 ENCOUNTER — Ambulatory Visit (INDEPENDENT_AMBULATORY_CARE_PROVIDER_SITE_OTHER): Payer: BLUE CROSS/BLUE SHIELD | Admitting: Physician Assistant

## 2017-02-12 ENCOUNTER — Encounter: Payer: Self-pay | Admitting: Physician Assistant

## 2017-02-12 VITALS — BP 100/58 | HR 71 | Temp 98.4°F | Resp 16 | Ht 61.0 in | Wt 112.0 lb

## 2017-02-12 DIAGNOSIS — M79642 Pain in left hand: Secondary | ICD-10-CM | POA: Diagnosis not present

## 2017-02-12 DIAGNOSIS — Z79899 Other long term (current) drug therapy: Secondary | ICD-10-CM | POA: Diagnosis not present

## 2017-02-12 DIAGNOSIS — M79641 Pain in right hand: Secondary | ICD-10-CM

## 2017-02-12 NOTE — Patient Instructions (Signed)
     IF you received an x-ray today, you will receive an invoice from [facility]. Please contact [facility] at [phone] with questions or concerns regarding your invoice.   IF you received labwork today, you will receive an invoice from [facility]. Please contact [facility] at [phone] with questions or concerns regarding your invoice.   Our billing staff will not be able to assist you with questions regarding bills from these companies.  You will be contacted with the lab results as soon as they are available. The fastest way to get your results is to activate your My Chart account. Instructions are located on the last page of this paperwork. If you have not heard from us regarding the results in 2 weeks, please contact this office.     

## 2017-02-12 NOTE — Progress Notes (Signed)
Ruth Harrison  MRN: [id number] DOB: 1966/06/16  PCP: Porfirio Oarhelle Jeffery, PA-C  Subjective:  Pt is a pleasant 51 year old female who presents to clinic for f/u medication management.  She was here 3/07 for joint pain. She has been seen several times in the past by PCP, radiology and rheumatology for this c/c. Last OV started on 4mg  qd Methylprednisolone. She is feeling well today, reports no improvement in her pain. Denies medication side effects.   She has a 51 year old daughter.   Review of Systems  Respiratory: Negative for cough, chest tightness, shortness of breath and wheezing.   Cardiovascular: Negative for chest pain and palpitations.  Gastrointestinal: Negative for abdominal pain, diarrhea, nausea and vomiting.  Musculoskeletal: Positive for arthralgias. Negative for back pain, joint swelling, myalgias and neck pain.  Psychiatric/Behavioral: Negative for sleep disturbance.    Patient Active Problem List   Diagnosis Date Noted  . Vitamin D insufficiency 01/04/2017  . History of endometriosis 01/02/2017  . OSA on CPAP 06/16/2016  . Hx of adenomatous colonic polyps 10/20/2014  . IBS (irritable bowel syndrome) 09/23/2014  . Hematochezia 09/23/2014  . Anal fissure 09/23/2014  . Hemochromatosis 01/31/2012  . PALPITATIONS 07/11/2010  . CHEST PAIN-UNSPECIFIED 07/11/2010    Current Outpatient Prescriptions on File Prior to Visit  Medication Sig Dispense Refill  . Ascorbic Acid (VITAMIN C) 1000 MG tablet Take 1,000 mg by mouth 2 (two) times daily.    . cholecalciferol (VITAMIN D) 1000 units tablet Take 1,000 Units by mouth daily.    . Cholecalciferol (VITAMIN D3) 3000 units TABS Take by mouth.    . cyclobenzaprine (FLEXERIL) 5 MG tablet Take 5 mg by mouth 3 (three) times daily as needed for muscle spasms.    Marland Kitchen. dextromethorphan-guaiFENesin (MUCINEX DM) 30-600 MG per 12 hr tablet Take 1 tablet by mouth 2 (two) times daily as needed for cough.    . dicyclomine (BENTYL) 20 MG  tablet Take 1 tablet (20 mg total) by mouth 4 (four) times daily -  before meals and at bedtime. (Patient taking differently: Take 20 mg by mouth as needed. ) 60 tablet 0  . DIGESTIVE ENZYMES PO Take 500 mg by mouth daily as needed (digestion).     . fluticasone (FLONASE) 50 MCG/ACT nasal spray Place 1 spray into both nostrils daily. (Patient taking differently: Place 1 spray into both nostrils as needed. ) 16 g 3  . loratadine (CLARITIN) 10 MG tablet Take 10 mg by mouth daily as needed.     . Magnesium 250 MG TABS Take by mouth.    . methylPREDNISolone (MEDROL) 4 MG tablet Take 1 tablet (4 mg total) by mouth daily. 30 tablet 0  . Zinc 50 MG TABS Take 50 mg by mouth 2 (two) times daily.     No current facility-administered medications on file prior to visit.     Allergies  Allergen Reactions  . Oxycodone Hives  . Penicillins Itching  . Prednisone Shortness Of Breath    Chest pain and double vision  . Valacyclovir Hcl Hives  . Sulfa Antibiotics Other (See Comments)    Unknown reaction not anyphalaxis     Objective:  BP (!) 100/58 (BP Location: Right Arm, Patient Position: Sitting, Cuff Size: Small)   Pulse 71   Temp 98.4 F (36.9 C) (Oral)   Resp 16   Ht 5\' 1"  (1.549 m)   Wt 112 lb (50.8 kg)   SpO2 98%   BMI 21.16 kg/m  Physical Exam  Constitutional: She is oriented to person, place, and time and well-developed, well-nourished, and in no distress. No distress.  Cardiovascular: Normal rate, regular rhythm and normal heart sounds.   Musculoskeletal:  No erythema, swelling b/l hands, wrists, forearms or elbows. Not TTP.  Neurological: She is alert and oriented to person, place, and time. GCS score is 15.  Skin: Skin is warm and dry.  Psychiatric: Mood, memory, affect and judgment normal.  Vitals reviewed.   Assessment and Plan :  1. Encounter for medication management 2. Pain in both hands - Pt reports no relief from hand pain with 4mg  Methylprednisolone. Denies  medication side effects. Will increase dose to 8mg  qd. F/u in one week. Will refer to rheumatology in the future if no improvement. She agrees with plan.   Marco Collie, PA-C  Primary Care at [facility] 02/12/2017 8:38 AM

## 2017-02-21 ENCOUNTER — Other Ambulatory Visit: Payer: Self-pay | Admitting: Physician Assistant

## 2017-02-21 ENCOUNTER — Encounter: Payer: Self-pay | Admitting: Physician Assistant

## 2017-02-21 DIAGNOSIS — M353 Polymyalgia rheumatica: Secondary | ICD-10-CM

## 2017-02-21 MED ORDER — METHYLPREDNISOLONE 8 MG PO TABS: 8.0000 mg | ORAL_TABLET | Freq: Every day | ORAL | 0 refills | Status: DC

## 2017-02-22 ENCOUNTER — Other Ambulatory Visit: Payer: Self-pay | Admitting: Physician Assistant

## 2017-02-22 DIAGNOSIS — M79641 Pain in right hand: Secondary | ICD-10-CM

## 2017-02-22 DIAGNOSIS — M79642 Pain in left hand: Secondary | ICD-10-CM

## 2017-02-22 DIAGNOSIS — M6289 Other specified disorders of muscle: Secondary | ICD-10-CM

## 2017-02-22 MED ORDER — METHYLPREDNISOLONE 4 MG PO TABS: 4.0000 mg | ORAL_TABLET | Freq: Every day | ORAL | 0 refills | Status: DC

## 2017-02-23 ENCOUNTER — Encounter: Payer: Self-pay | Admitting: Cardiovascular Disease

## 2017-02-23 ENCOUNTER — Ambulatory Visit (INDEPENDENT_AMBULATORY_CARE_PROVIDER_SITE_OTHER): Payer: BLUE CROSS/BLUE SHIELD | Admitting: Cardiovascular Disease

## 2017-02-23 VITALS — BP 102/68 | HR 66 | Ht 61.0 in | Wt 110.6 lb

## 2017-02-23 DIAGNOSIS — R0789 Other chest pain: Secondary | ICD-10-CM | POA: Diagnosis not present

## 2017-02-23 NOTE — Progress Notes (Signed)
02/23/2017 Ruth Harrison   Aug 17, 1966  [id number]  Primary Physician [name], PA-C Primary Cardiologist: Runell Gess MD Roseanne Reno  HPI:  Ruth Harrison is a delightful 51 year old thin appearing married Caucasian female with one adopted child discomfort by her husband Ruth Harrison who also is a patient of mine. I saw Ruth Harrison 3 years ago for a DVT. She was referred by Precious Haws PA-C at [facility] urgent care for evaluation of atypical chest pain. She has no cardiac risk factors. She does have a history of hemachromatosis. She works as an Therapist, occupational. She had chest pain for several years occurring sometimes on daily basis lasting hours at a time with left upper extremity radiation. There are no other associated symptoms. The pain does not necessarily occur with activity.   Current Outpatient Prescriptions  Medication Sig Dispense Refill  . Ascorbic Acid (VITAMIN C) 1000 MG tablet Take 1,000 mg by mouth 2 (two) times daily.    . cholecalciferol (VITAMIN D) 1000 units tablet Take 1,000 Units by mouth daily.    . Cholecalciferol (VITAMIN D3) 3000 units TABS Take by mouth.    . cyclobenzaprine (FLEXERIL) 5 MG tablet Take 5 mg by mouth 3 (three) times daily as needed for muscle spasms.    Marland Kitchen dextromethorphan-guaiFENesin (MUCINEX DM) 30-600 MG per 12 hr tablet Take 1 tablet by mouth 2 (two) times daily as needed for cough.    . dicyclomine (BENTYL) 20 MG tablet Take 1 tablet (20 mg total) by mouth 4 (four) times daily -  before meals and at bedtime. (Patient taking differently: Take 20 mg by mouth as needed. ) 60 tablet 0  . DIGESTIVE ENZYMES PO Take 500 mg by mouth daily as needed (digestion).     . fluticasone (FLONASE) 50 MCG/ACT nasal spray Place 1 spray into both nostrils daily. (Patient taking differently: Place 1 spray into both nostrils as needed. ) 16 g 3  . loratadine (CLARITIN) 10 MG tablet Take 10 mg by mouth daily as needed.     . Magnesium  250 MG TABS Take by mouth.    . methylPREDNISolone (MEDROL) 4 MG tablet Take 1 tablet (4 mg total) by mouth daily. Take 12 mg total - one 4mg  tab +one 8mg  tab. 30 tablet 0  . methylPREDNISolone (MEDROL) 8 MG tablet Take 1 tablet (8 mg total) by mouth daily. 30 tablet 0  . Zinc 50 MG TABS Take 50 mg by mouth 2 (two) times daily.     No current facility-administered medications for this visit.     Allergies  Allergen Reactions  . Oxycodone Hives  . Penicillins Itching  . Prednisone Shortness Of Breath    Chest pain and double vision  . Valacyclovir Hcl Hives  . Sulfa Antibiotics Other (See Comments)    Unknown reaction not anyphalaxis    Social History   Social History  . Marital status: Married    Spouse name: N/A  . Number of children: 1  . Years of education: N/A   Occupational History  . 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 Smoking status: Never Smoker  . Smokeless tobacco: Never Used  . Alcohol use No  . Drug use: No  . Sexual activity: Yes   Other Topics Concern  . Not on file   Social History Narrative   Originally from [place]. Lived in [place] most of her life. Has lived in [place] since 1997.  Has 1 adopted daughter and 3 step-sons.      Review of Systems: General: negative for chills, fever, night sweats or weight changes.  Cardiovascular: negative for chest pain, dyspnea on exertion, edema, orthopnea, palpitations, paroxysmal nocturnal dyspnea or shortness of breath Dermatological: negative for rash Respiratory: negative for cough or wheezing Urologic: negative for hematuria Abdominal: negative for nausea, vomiting, diarrhea, bright red blood per rectum, melena, or hematemesis Neurologic: negative for visual changes, syncope, or dizziness All other systems reviewed and are otherwise negative except as noted above.    Blood pressure 102/68, pulse 66, height 5\' 1"  (1.549 m), weight 110 lb 9.6 oz (50.2 kg).  General appearance: alert  and no distress Neck: no adenopathy, no carotid bruit, no JVD, supple, symmetrical, trachea midline and thyroid not enlarged, symmetric, no tenderness/mass/nodules Lungs: clear to auscultation bilaterally Heart: regular rate and rhythm, S1, S2 normal, no murmur, click, rub or gallop Extremities: extremities normal, atraumatic, no cyanosis or edema  EKG sinus rhythm at 66 without ST or T-wave changes. I personally reviewed this EKG  ASSESSMENT AND PLAN:   Chest pain Ms. Malvin Johnsotter was referred by her primary care provider for evaluation of atypical chest pain. She is a 51 year old thin appearing married Caucasian female with no cardiac risk factors. The pain has been going on for approximately 2 years. It can occur daily for hours at a time with left upper extremity radiation. Has not brought on by any activity in particular. I'm going to get a routine GXT to further evaluate this. In addition, she does have a history of hemachromatosis. Gets occasional phlebotomy. I'm going to get a 2-D echocardiogram to evaluate LV function.      Runell GessJonathan J. Lakitha Gordy MD FACP,FACC,FAHA, [facility] 02/23/2017 11:21 AM

## 2017-02-23 NOTE — Assessment & Plan Note (Signed)
Ms. Ruth Harrison was referred by her primary care provider for evaluation of atypical chest pain. She is a 51 year old thin appearing married Caucasian female with no cardiac risk factors. The pain has been going on for approximately 2 years. It can occur daily for hours at a time with left upper extremity radiation. Has not brought on by any activity in particular. I'm going to get a routine GXT to further evaluate this. In addition, she does have a history of hemachromatosis. Gets occasional phlebotomy. I'm going to get a 2-D echocardiogram to evaluate LV function.

## 2017-02-23 NOTE — Patient Instructions (Addendum)
Medication Instructions:  Your physician recommends that you continue on your current medications as directed. Please refer to the Current Medication list given to you today.  Labwork: None   Testing/Procedures: Your physician has requested that you have an exercise tolerance test. For further information please visit [email or web address]. Please also follow instruction sheet, as given.  Your physician has requested that you have an echocardiogram. Echocardiography is a painless test that uses sound waves to create images of your heart. It provides your doctor with information about the size and shape of your heart and how well your heart's chambers and valves are working. This procedure takes approximately one hour. There are no restrictions for this procedure.  Follow-Up: Your physician recommends that you schedule a follow-up appointment in: AFTER ALL TESTING FOLLOW UP WITH DR Allyson SabalBERRY   Any Other Special Instructions Will Be Listed Below (If Applicable).  If you need a refill on your cardiac medications before your next appointment, please call your pharmacy.

## 2017-03-10 ENCOUNTER — Encounter: Payer: Self-pay | Admitting: Physician Assistant

## 2017-03-15 ENCOUNTER — Telehealth (HOSPITAL_COMMUNITY): Payer: Self-pay

## 2017-03-15 NOTE — Telephone Encounter (Signed)
Encounter complete. 

## 2017-03-16 ENCOUNTER — Other Ambulatory Visit (HOSPITAL_COMMUNITY): Payer: BLUE CROSS/BLUE SHIELD

## 2017-03-20 ENCOUNTER — Ambulatory Visit (HOSPITAL_COMMUNITY)
Admission: RE | Admit: 2017-03-20 | Discharge: 2017-03-20 | Disposition: A | Discharge status: Home / Self Care | Payer: BLUE CROSS/BLUE SHIELD | Source: Ambulatory Visit | Attending: Cardiology | Admitting: Cardiology

## 2017-03-20 DIAGNOSIS — R0789 Other chest pain: Secondary | ICD-10-CM | POA: Diagnosis not present

## 2017-03-20 LAB — EXERCISE TOLERANCE TEST
CHL CUP RESTING HR STRESS: 60 {beats}/min
CSEPEW: 11.7 METS
CSEPPHR: 157 {beats}/min
Exercise duration (min): 10 min
Exercise duration (sec): 0 s
MPHR: 170 {beats}/min
Percent HR: 92 %
RPE: 17

## 2017-03-23 ENCOUNTER — Other Ambulatory Visit: Payer: Self-pay | Admitting: Physician Assistant

## 2017-03-23 DIAGNOSIS — M2550 Pain in unspecified joint: Secondary | ICD-10-CM

## 2017-03-23 NOTE — Progress Notes (Signed)
Joint pain is worsening. Will refer to rheumatology.

## 2017-03-28 ENCOUNTER — Ambulatory Visit (HOSPITAL_COMMUNITY): Payer: BLUE CROSS/BLUE SHIELD | Attending: Cardiology

## 2017-03-28 ENCOUNTER — Other Ambulatory Visit: Payer: Self-pay

## 2017-03-28 DIAGNOSIS — I071 Rheumatic tricuspid insufficiency: Secondary | ICD-10-CM | POA: Diagnosis not present

## 2017-03-28 DIAGNOSIS — R0789 Other chest pain: Secondary | ICD-10-CM

## 2017-04-03 ENCOUNTER — Encounter: Payer: Self-pay | Admitting: Cardiovascular Disease

## 2017-04-03 ENCOUNTER — Ambulatory Visit (INDEPENDENT_AMBULATORY_CARE_PROVIDER_SITE_OTHER): Payer: BLUE CROSS/BLUE SHIELD | Admitting: Cardiovascular Disease

## 2017-04-03 DIAGNOSIS — I208 Other forms of angina pectoris: Secondary | ICD-10-CM

## 2017-04-03 NOTE — Progress Notes (Signed)
Ruth Harrison returns today for follow-up of her outpatient diagnostic test done because of atypical chest pain. Her 2-D echo was normal. Exercise stress test was normal as well. Her pain is somewhat atypical. I have reassured her that unlikely given her age, lack of risk factors and abnormal stress test and her pain is cardiac in nature. I will see her back in 6 months.

## 2017-04-03 NOTE — Patient Instructions (Signed)
Your physician recommends that you schedule a follow-up appointment in:6 months with Dr Berry 

## 2017-04-03 NOTE — Assessment & Plan Note (Signed)
Ms. Ruth Harrison returns today for follow-up of her outpatient diagnostic test done because of atypical chest pain. Her 2-D echo was normal. Exercise stress test was normal as well. Her pain is somewhat atypical. I have reassured her that unlikely given her age, lack of risk factors and abnormal stress test and her pain is cardiac in nature. I will see her back in 6 months. 

## 2017-05-14 ENCOUNTER — Ambulatory Visit: Payer: BLUE CROSS/BLUE SHIELD | Admitting: Physician Assistant

## 2017-05-23 ENCOUNTER — Encounter: Payer: Self-pay | Admitting: Physician Assistant

## 2017-05-23 DIAGNOSIS — M2550 Pain in unspecified joint: Secondary | ICD-10-CM | POA: Insufficient documentation

## 2017-07-06 ENCOUNTER — Telehealth: Payer: Self-pay | Admitting: Internal Medicine

## 2017-07-06 ENCOUNTER — Other Ambulatory Visit: Payer: BLUE CROSS/BLUE SHIELD

## 2017-07-06 NOTE — Telephone Encounter (Signed)
Call day - moved 8/6 lab/fu to 8/6. Spoke with patient re new lab/fu for 8/28 @ 2:30 pm for lab and f/u @ 3:30 pm.

## 2017-07-09 ENCOUNTER — Ambulatory Visit: Payer: BLUE CROSS/BLUE SHIELD | Admitting: Internal Medicine

## 2017-07-31 ENCOUNTER — Encounter: Payer: Self-pay | Admitting: Internal Medicine

## 2017-07-31 ENCOUNTER — Other Ambulatory Visit (HOSPITAL_BASED_OUTPATIENT_CLINIC_OR_DEPARTMENT_OTHER): Payer: BLUE CROSS/BLUE SHIELD

## 2017-07-31 ENCOUNTER — Telehealth: Payer: Self-pay | Admitting: Internal Medicine

## 2017-07-31 ENCOUNTER — Ambulatory Visit (HOSPITAL_BASED_OUTPATIENT_CLINIC_OR_DEPARTMENT_OTHER): Payer: BLUE CROSS/BLUE SHIELD | Admitting: Internal Medicine

## 2017-07-31 LAB — COMPREHENSIVE METABOLIC PANEL
ALT: 24 U/L (ref 0–55)
ANION GAP: 6 meq/L (ref 3–11)
AST: 20 U/L (ref 5–34)
Albumin: 3.9 g/dL (ref 3.5–5.0)
Alkaline Phosphatase: 45 U/L (ref 40–150)
BILIRUBIN TOTAL: 0.6 mg/dL (ref 0.20–1.20)
BUN: 9.7 mg/dL (ref 7.0–26.0)
CHLORIDE: 105 meq/L (ref 98–109)
CO2: 29 meq/L (ref 22–29)
CREATININE: 0.8 mg/dL (ref 0.6–1.1)
Calcium: 9.5 mg/dL (ref 8.4–10.4)
EGFR: 87 mL/min/{1.73_m2} — ABNORMAL LOW (ref >90)
GLUCOSE: 99 mg/dL (ref 70–140)
Potassium: 4.2 mEq/L (ref 3.5–5.1)
SODIUM: 140 meq/L (ref 136–145)
TOTAL PROTEIN: 7.2 g/dL (ref 6.4–8.3)

## 2017-07-31 LAB — FERRITIN: FERRITIN: 81 ng/mL (ref 9–269)

## 2017-07-31 LAB — CBC WITH DIFFERENTIAL/PLATELET
BASO%: 0.4 % (ref 0.0–2.0)
Basophils Absolute: 0 10*3/uL (ref 0.0–0.1)
EOS%: 1.1 % (ref 0.0–7.0)
Eosinophils Absolute: 0 10*3/uL (ref 0.0–0.5)
HEMATOCRIT: 39.8 % (ref 34.8–46.6)
HEMOGLOBIN: 13.3 g/dL (ref 11.6–15.9)
LYMPH#: 1.9 10*3/uL (ref 0.9–3.3)
LYMPH%: 48.1 % (ref 14.0–49.7)
MCH: 32.4 pg (ref 25.1–34.0)
MCHC: 33.5 g/dL (ref 31.5–36.0)
MCV: 96.7 fL (ref 79.5–101.0)
MONO#: 0.3 10*3/uL (ref 0.1–0.9)
MONO%: 8.3 % (ref 0.0–14.0)
NEUT%: 42.1 % (ref 38.4–76.8)
NEUTROS ABS: 1.7 10*3/uL (ref 1.5–6.5)
PLATELETS: 165 10*3/uL (ref 145–400)
RBC: 4.12 10*6/uL (ref 3.70–5.45)
RDW: 12.6 % (ref 11.2–14.5)
WBC: 4 10*3/uL (ref 3.9–10.3)

## 2017-07-31 LAB — IRON AND TIBC
%SAT: 78 % — AB (ref 21–57)
Iron: 181 ug/dL — ABNORMAL HIGH (ref 41–142)
TIBC: 231 ug/dL — ABNORMAL LOW (ref 236–444)
UIBC: 51 ug/dL — AB (ref 120–384)

## 2017-07-31 NOTE — Telephone Encounter (Signed)
Scheduled appt per 8/28 los - Gave patient AVS and calender per los.  

## 2017-07-31 NOTE — Progress Notes (Signed)
[facility] [phone]   [phone]  OFFICE PROGRESS NOTE  [name], PA-C [address] 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   PRIOR THERAPY: Weekly phlebotomies x3, last one was in January of 2014.   CURRENT THERAPY: Observation.  INTERVAL HISTORY: Ruth Harrison 51 y.o. female returns to the clinic today for six-month follow-up visit. The patient is feeling fine today with no specific complaints. She was recently treated for questionable sinus infection. She denied having any fatigue or weakness. She has no nausea, vomiting, diarrhea or constipation. She has no significant chest pain, shortness of breath, cough or hemoptysis. The patient is here today for evaluation with repeat CBC, iron study and ferritin.   MEDICAL HISTORY: Past Medical History:  Diagnosis Date  . Allergy    SEASONAL  . Chronic neck pain   . Colon polyps 2010  . Endometriosis   . Headache   . Hemochromatosis, hereditary (HCC)   . IBS (irritable bowel syndrome)   . Urinary tract infection     ALLERGIES:  is allergic to oxycodone; penicillins; prednisone; valacyclovir hcl; and sulfa antibiotics.  MEDICATIONS:  Current Outpatient Prescriptions  Medication Sig Dispense Refill  . Ascorbic Acid (VITAMIN C) 1000 MG tablet Take 1,000 mg by mouth 2 (two) times daily.    . cholecalciferol (VITAMIN D) 1000 units tablet Take 1,000 Units by mouth daily.    . Cholecalciferol (VITAMIN D3) 3000 units TABS Take by mouth.    . cyclobenzaprine (FLEXERIL) 5 MG tablet Take 5 mg by mouth 3 (three) times daily as needed for muscle spasms.    Marland Kitchen dextromethorphan-guaiFENesin (MUCINEX DM) 30-600 MG per 12 hr tablet Take 1 tablet by mouth 2 (two) times daily as needed for cough.    . dicyclomine (BENTYL) 20 MG tablet Take 1 tablet (20 mg total) by mouth 4 (four) times daily -  before meals and at bedtime. (Patient taking  differently: Take 20 mg by mouth as needed. ) 60 tablet 0  . DIGESTIVE ENZYMES PO Take 500 mg by mouth daily as needed (digestion).     . fluticasone (FLONASE) 50 MCG/ACT nasal spray Place 1 spray into both nostrils daily. (Patient taking differently: Place 1 spray into both nostrils as needed. ) 16 g 3  . loratadine (CLARITIN) 10 MG tablet Take 10 mg by mouth daily as needed.     . Magnesium 250 MG TABS Take by mouth.    . Zinc 50 MG TABS Take 50 mg by mouth 2 (two) times daily.     No current facility-administered medications for this visit.     SURGICAL HISTORY:  Past Surgical History:  Procedure Laterality Date  . ABDOMINAL HYSTERECTOMY  partial   . LAPAROSCOPIC ENDOMETRIOSIS FULGURATION    . SHOULDER SURGERY  2015   right     REVIEW OF SYSTEMS:  A comprehensive review of systems was negative.   PHYSICAL EXAMINATION: General appearance: alert, cooperative and no distress Head: Normocephalic, without obvious abnormality, atraumatic Neck: no adenopathy Lymph nodes: Cervical, supraclavicular, and axillary nodes normal. Resp: clear to auscultation bilaterally Back: symmetric, no curvature. ROM normal. No CVA tenderness. Cardio: regular rate and rhythm, S1, S2 normal, no murmur, click, rub or gallop GI: soft, non-tender; bowel sounds normal; no masses,  no organomegaly Extremities: extremities normal, atraumatic, no cyanosis or edema  ECOG PERFORMANCE STATUS: 0 - Asymptomatic  Blood pressure (!) 114/58, pulse 70, temperature 99.1 F (37.3  C), temperature source Oral, resp. rate 20, height 5\' 1"  (1.549 m), weight 111 lb 9.6 oz (50.6 kg), SpO2 100 %.  LABORATORY DATA: Lab Results  Component Value Date   WBC 4.0 07/31/2017   HGB 13.3 07/31/2017   HCT 39.8 07/31/2017   MCV 96.7 07/31/2017   PLT 165 07/31/2017      Chemistry      Component Value Date/Time   NA 141 01/08/2017 1553   K 4.2 01/08/2017 1553   CL 102 11/15/2016 1148   CL 103 08/15/2012 1600   CO2 28  01/08/2017 1553   BUN 13.0 01/08/2017 1553   CREATININE 0.7 01/08/2017 1553      Component Value Date/Time   CALCIUM 9.6 01/08/2017 1553   ALKPHOS 55 01/08/2017 1553   AST 21 01/08/2017 1553   ALT 30 01/08/2017 1553   BILITOT 0.40 01/08/2017 1553     Other lab results: Iron study and ferritin are still pending.  RADIOGRAPHIC STUDIES: No results found.  ASSESSMENT AND PLAN:  This is a very pleasant 51 years old white female with hereditary hemochromatosis with homozygous C282Y mutation.  The patient underwent several phlebotomies in the past and currently on observation. CBC today is unremarkable but the iron study and ferritin are still pending. I recommended for the patient to continue on observation for now with repeat CBC, comprehensive metabolic panel, iron study and ferritin in 6 months. If the pending lab results showed any concerning finding for worsening iron and ferritin level, we will asked the patient to come sooner for phlebotomy. She was advised to call immediately if she has any concerning symptoms in the interval. All questions were answered. The patient knows to call the clinic with any problems, questions or concerns. We can certainly see the patient much sooner if necessary. I spent 10 minutes counseling the patient face to face. The total time spent in the appointment was 15 minutes.  Disclaimer: This note was dictated with voice recognition software. Similar sounding words can inadvertently be transcribed and may be missed upon review.

## 2017-08-01 ENCOUNTER — Telehealth: Payer: Self-pay

## 2017-08-01 NOTE — Telephone Encounter (Signed)
Pt called for ferritin and iron levels. She is scheduled for phlebotomy on Friday. Results were given to pt. Ferritin 81, iron 181. No parameters seen in notes for phlebotomy.

## 2017-08-02 ENCOUNTER — Encounter: Payer: Self-pay | Admitting: Internal Medicine

## 2017-08-03 ENCOUNTER — Other Ambulatory Visit: Payer: Self-pay | Admitting: *Deleted

## 2017-08-03 ENCOUNTER — Ambulatory Visit: Payer: BLUE CROSS/BLUE SHIELD

## 2017-08-03 NOTE — Patient Instructions (Signed)

## 2017-08-15 ENCOUNTER — Ambulatory Visit: Payer: BLUE CROSS/BLUE SHIELD | Admitting: Physician Assistant

## 2017-08-15 ENCOUNTER — Ambulatory Visit (INDEPENDENT_AMBULATORY_CARE_PROVIDER_SITE_OTHER): Payer: BLUE CROSS/BLUE SHIELD | Admitting: Physician Assistant

## 2017-08-15 ENCOUNTER — Encounter: Payer: Self-pay | Admitting: Physician Assistant

## 2017-08-15 VITALS — BP 95/58 | HR 69 | Temp 98.1°F | Resp 17 | Ht 62.5 in | Wt 113.0 lb

## 2017-08-15 DIAGNOSIS — Z9109 Other allergy status, other than to drugs and biological substances: Secondary | ICD-10-CM | POA: Diagnosis not present

## 2017-08-15 DIAGNOSIS — J0100 Acute maxillary sinusitis, unspecified: Secondary | ICD-10-CM | POA: Diagnosis not present

## 2017-08-15 DIAGNOSIS — K581 Irritable bowel syndrome with constipation: Secondary | ICD-10-CM | POA: Diagnosis not present

## 2017-08-15 MED ORDER — AZITHROMYCIN 250 MG PO TABS: ORAL_TABLET | ORAL | 0 refills | Status: DC

## 2017-08-15 MED ORDER — DICYCLOMINE HCL 20 MG PO TABS: 20.0000 mg | ORAL_TABLET | Freq: Three times a day (TID) | ORAL | 1 refills | Status: DC

## 2017-08-15 MED ORDER — MONTELUKAST SODIUM 10 MG PO TABS: 10.0000 mg | ORAL_TABLET | Freq: Every day | ORAL | 3 refills | Status: DC

## 2017-08-15 NOTE — Progress Notes (Signed)
Patient ID: Ruth Harrison, female    DOB: [birth year], 51 y.o.   MRN: [id number]  PCP: [name], PA-C  Chief Complaint  Patient presents with  . Sinusitis    Subjective:   Presents for evaluation of possible sinusitis.  Started on CPAP 2 years ago. Ever since, she's had increased frequency of sinusitis, now every 3-4 months. [facility] ([facility]) was in August. Was prescribed doxycycline, but took only 4-5 days of the 7 day course due to GI upset. Felt like she had fluid in her ears, and dizziness, even after other symptoms were resolved. This past Saturday she started to feel bad again, sore throat. Salt water gargle and salt water spray have helped the sore throat. Now blowing out green phlegm. In August, she had a swelling of the RIGHT tonsillar node area, which resolved with treatment.  Two brothers died of throat cancer this year, ages 58 and 19. Both were heavy smokers.   Review of Systems  Constitutional: Positive for fatigue (generalized malaise). Negative for activity change, appetite change, chills, diaphoresis, fever and unexpected weight change.  HENT: Positive for congestion, postnasal drip, rhinorrhea, sinus pain, sinus pressure and sore throat. Negative for dental problem, drooling, ear discharge, ear pain (fullness), facial swelling, hearing loss, mouth sores, nosebleeds, sneezing, tinnitus, trouble swallowing and voice change.   Eyes: Negative for visual disturbance.  Respiratory: Negative for cough, chest tightness, shortness of breath and wheezing.   Cardiovascular: Negative for chest pain and palpitations.  Gastrointestinal: Negative for abdominal pain, diarrhea, nausea and vomiting.  Genitourinary: Negative for dysuria, frequency, hematuria and urgency.  Musculoskeletal: Negative for arthralgias and myalgias.  Skin: Negative for rash.  Neurological: Negative for dizziness, weakness and headaches.  Psychiatric/Behavioral: Negative for  decreased concentration. The patient is not nervous/anxious.        Patient Active Problem List   Diagnosis Date Noted  . Polyarthralgia 05/23/2017  . Heel cord tightness, right 01/26/2017  . Plantar fasciitis 01/26/2017  . Tightness of heel cord, left 01/26/2017  . Vitamin D insufficiency 01/04/2017  . History of endometriosis 01/02/2017  . OSA on CPAP 06/16/2016  . Hx of adenomatous colonic polyps 10/20/2014  . IBS (irritable bowel syndrome) 09/23/2014  . Hematochezia 09/23/2014  . Anal fissure 09/23/2014  . Hemochromatosis 01/31/2012  . PALPITATIONS 07/11/2010  . Chest pain 07/11/2010     Prior to Admission medications   Medication Sig Start Date End Date Taking? Authorizing Provider  Ascorbic Acid (VITAMIN C) 1000 MG tablet Take 1,000 mg by mouth 2 (two) times daily.   Yes [provider]  cholecalciferol (VITAMIN D) 1000 units tablet Take 1,000 Units by mouth daily.   Yes [provider]  Cholecalciferol (VITAMIN D3) 3000 units TABS Take by mouth.   Yes [provider]  cyclobenzaprine (FLEXERIL) 5 MG tablet Take 5 mg by mouth 3 (three) times daily as needed for muscle spasms.   Yes [provider]  dextromethorphan-guaiFENesin (MUCINEX DM) 30-600 MG per 12 hr tablet Take 1 tablet by mouth 2 (two) times daily as needed for cough.   Yes [provider]  dicyclomine (BENTYL) 20 MG tablet Take 1 tablet (20 mg total) by mouth 4 (four) times daily -  before meals and at bedtime. Patient taking differently: Take 20 mg by mouth as needed.  07/11/16  Yes Wallis Bamberg, PA-C  DIGESTIVE ENZYMES PO Take 500 mg by mouth daily as needed (digestion).    Yes [provider]  fluticasone (FLONASE) 50 MCG/ACT nasal spray Place 1 spray into both nostrils daily. Patient taking differently: Place 1 spray into both nostrils as needed.  06/16/16  Yes Roslynn Amble, MD  loratadine (CLARITIN) 10 MG tablet Take 10 mg by mouth daily as needed.     Yes [provider]  Magnesium 250 MG TABS Take by mouth.   Yes [provider]  Zinc 50 MG TABS Take 50 mg by mouth 2 (two) times daily.   Yes [provider]     Allergies  Allergen Reactions  . Oxycodone Hives  . Penicillins Itching  . Prednisone Shortness Of Breath    Chest pain and double vision  . Valacyclovir Hcl Hives  . Sulfa Antibiotics Other (See Comments)    Unknown reaction not anyphalaxis       Objective:  Physical Exam  Constitutional: She is oriented to person, place, and time. She appears well-developed and well-nourished. No distress.  BP (!) 95/58   Pulse 69   Temp 98.1 F (36.7 C) (Oral)   Resp 17   Ht 5' 2.5" (1.588 m)   Wt 113 lb (51.3 kg)   SpO2 98%   BMI 20.34 kg/m    HENT:  Head: Normocephalic and atraumatic.  Right Ear: Hearing, tympanic membrane, external ear and ear canal normal.  Left Ear: Hearing, tympanic membrane, external ear and ear canal normal.  Nose: Mucosal edema and rhinorrhea present.  No foreign bodies. Right sinus exhibits maxillary sinus tenderness and frontal sinus tenderness. Left sinus exhibits maxillary sinus tenderness and frontal sinus tenderness.  Mouth/Throat: Uvula is midline, oropharynx is clear and moist and mucous membranes are normal. No uvula swelling. No oropharyngeal exudate.  Eyes: Pupils are equal, round, and reactive to light. Conjunctivae and EOM are normal. Right eye exhibits no discharge. Left eye exhibits no discharge. No scleral icterus.  Neck: Trachea normal, normal range of motion and full passive range of motion without pain. Neck supple. No thyroid mass and no thyromegaly present.  Cardiovascular: Normal rate, regular rhythm and normal heart sounds.   Pulmonary/Chest: Effort normal and breath sounds normal.  Lymphadenopathy:       Head (right side): No submandibular, no tonsillar, no preauricular, no posterior auricular and no occipital adenopathy present.       Head (left  side): No submandibular, no tonsillar, no preauricular and no occipital adenopathy present.    She has no cervical adenopathy.       Right: No supraclavicular adenopathy present.       Left: No supraclavicular adenopathy present.  Neurological: She is alert and oriented to person, place, and time. She has normal strength. No cranial nerve deficit or sensory deficit.  Skin: Skin is warm, dry and intact. No rash noted.  Psychiatric: She has a normal mood and affect. Her speech is normal and behavior is normal.           Assessment & Plan:   Problem List Items Addressed This Visit    IBS (irritable bowel syndrome)    Stable. Refill dicyclomine.      Relevant Medications   dicyclomine (BENTYL) 20 MG tablet   Environmental allergies    Suspect uncontrolled allergies and irritation from CPAP are causing her frequent episodes of sinusitis. COntinue Flonase and Claritin. Add montelukast.      Relevant Medications   montelukast (SINGULAIR) 10 MG tablet    Other Visit Diagnoses    Acute maxillary sinusitis, recurrence not specified    -  Primary   Relevant Medications   azithromycin (ZITHROMAX) 250 MG tablet       Return if symptoms worsen or fail to improve.   Fernande Brashelle S. Zia Kanner, PA-C Primary Care at [facility]

## 2017-08-15 NOTE — Patient Instructions (Addendum)
Get plenty of rest and drink at least 64 ounces of water daily.    IF you received an x-ray today, you will receive an invoice from [facility]. Please contact [facility] at [phone] with questions or concerns regarding your invoice.   IF you received labwork today, you will receive an invoice from [facility]. Please contact [facility] at [phone] with questions or concerns regarding your invoice.   Our billing staff will not be able to assist you with questions regarding bills from these companies.  You will be contacted with the lab results as soon as they are available. The fastest way to get your results is to activate your My Chart account. Instructions are located on the last page of this paperwork. If you have not heard from us regarding the results in 2 weeks, please contact this office.      

## 2017-08-17 DIAGNOSIS — Z9109 Other allergy status, other than to drugs and biological substances: Secondary | ICD-10-CM | POA: Insufficient documentation

## 2017-08-17 NOTE — Assessment & Plan Note (Signed)
Suspect uncontrolled allergies and irritation from CPAP are causing her frequent episodes of sinusitis. COntinue Flonase and Claritin. Add montelukast.

## 2017-08-17 NOTE — Assessment & Plan Note (Signed)
Stable. Refill dicyclomine.

## 2017-08-23 DIAGNOSIS — G4733 Obstructive sleep apnea (adult) (pediatric): Secondary | ICD-10-CM | POA: Diagnosis not present

## 2017-09-01 DIAGNOSIS — M9902 Segmental and somatic dysfunction of thoracic region: Secondary | ICD-10-CM | POA: Diagnosis not present

## 2017-09-01 DIAGNOSIS — M530 Cervicocranial syndrome: Secondary | ICD-10-CM | POA: Diagnosis not present

## 2017-09-01 DIAGNOSIS — M9901 Segmental and somatic dysfunction of cervical region: Secondary | ICD-10-CM | POA: Diagnosis not present

## 2017-09-01 DIAGNOSIS — M9903 Segmental and somatic dysfunction of lumbar region: Secondary | ICD-10-CM | POA: Diagnosis not present

## 2017-09-05 DIAGNOSIS — M9903 Segmental and somatic dysfunction of lumbar region: Secondary | ICD-10-CM | POA: Diagnosis not present

## 2017-09-05 DIAGNOSIS — M530 Cervicocranial syndrome: Secondary | ICD-10-CM | POA: Diagnosis not present

## 2017-09-05 DIAGNOSIS — M9901 Segmental and somatic dysfunction of cervical region: Secondary | ICD-10-CM | POA: Diagnosis not present

## 2017-09-05 DIAGNOSIS — M9902 Segmental and somatic dysfunction of thoracic region: Secondary | ICD-10-CM | POA: Diagnosis not present

## 2017-09-21 ENCOUNTER — Encounter: Payer: Self-pay | Admitting: Physician Assistant

## 2017-09-21 MED ORDER — CLARITHROMYCIN ER 500 MG PO TB24: 1000.0000 mg | ORAL_TABLET | Freq: Every day | ORAL | 0 refills | Status: AC

## 2017-09-21 MED ORDER — AZELASTINE HCL 0.1 % NA SOLN: 2.0000 | Freq: Two times a day (BID) | NASAL | 0 refills | Status: DC

## 2017-09-21 MED ORDER — DOXYCYCLINE HYCLATE 100 MG PO CAPS: 100.0000 mg | ORAL_CAPSULE | Freq: Two times a day (BID) | ORAL | 0 refills | Status: DC

## 2017-09-21 NOTE — Addendum Note (Signed)
Addended by: Fernande BrasJEFFERY, Ariann Khaimov S on: 09/21/2017 07:16 PM   Modules accepted: Orders

## 2017-09-22 ENCOUNTER — Telehealth: Payer: Self-pay

## 2017-09-22 NOTE — Telephone Encounter (Signed)
Patient [facility]omycin [facility] [address]. Called  Script in to that pharmacy per patient request and [facility].

## 2017-09-28 DIAGNOSIS — M530 Cervicocranial syndrome: Secondary | ICD-10-CM | POA: Diagnosis not present

## 2017-09-28 DIAGNOSIS — M9902 Segmental and somatic dysfunction of thoracic region: Secondary | ICD-10-CM | POA: Diagnosis not present

## 2017-09-28 DIAGNOSIS — M9901 Segmental and somatic dysfunction of cervical region: Secondary | ICD-10-CM | POA: Diagnosis not present

## 2017-09-28 DIAGNOSIS — M9903 Segmental and somatic dysfunction of lumbar region: Secondary | ICD-10-CM | POA: Diagnosis not present

## 2017-10-12 ENCOUNTER — Ambulatory Visit (INDEPENDENT_AMBULATORY_CARE_PROVIDER_SITE_OTHER): Payer: BLUE CROSS/BLUE SHIELD | Admitting: Physician Assistant

## 2017-10-12 ENCOUNTER — Ambulatory Visit (INDEPENDENT_AMBULATORY_CARE_PROVIDER_SITE_OTHER): Payer: BLUE CROSS/BLUE SHIELD

## 2017-10-12 ENCOUNTER — Other Ambulatory Visit: Payer: Self-pay

## 2017-10-12 ENCOUNTER — Encounter: Payer: Self-pay | Admitting: Physician Assistant

## 2017-10-12 VITALS — BP 98/60 | HR 81 | Temp 98.3°F | Resp 18 | Ht 62.5 in | Wt 112.2 lb

## 2017-10-12 DIAGNOSIS — M25532 Pain in left wrist: Secondary | ICD-10-CM | POA: Diagnosis not present

## 2017-10-12 DIAGNOSIS — M654 Radial styloid tenosynovitis [de Quervain]: Secondary | ICD-10-CM

## 2017-10-12 MED ORDER — MELOXICAM 15 MG PO TABS: 15.0000 mg | ORAL_TABLET | Freq: Every day | ORAL | 1 refills | Status: DC

## 2017-10-12 NOTE — Patient Instructions (Signed)
     IF you received an x-ray today, you will receive an invoice from [facility]. Please contact [facility] at [phone] with questions or concerns regarding your invoice.   IF you received labwork today, you will receive an invoice from [facility]. Please contact [facility] at [phone] with questions or concerns regarding your invoice.   Our billing staff will not be able to assist you with questions regarding bills from these companies.  You will be contacted with the lab results as soon as they are available. The fastest way to get your results is to activate your My Chart account. Instructions are located on the last page of this paperwork. If you have not heard from us regarding the results in 2 weeks, please contact this office.     
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
## 2017-10-22 DIAGNOSIS — J029 Acute pharyngitis, unspecified: Secondary | ICD-10-CM | POA: Diagnosis not present

## 2017-10-22 DIAGNOSIS — J0140 Acute pansinusitis, unspecified: Secondary | ICD-10-CM | POA: Diagnosis not present

## 2017-10-24 ENCOUNTER — Ambulatory Visit (INDEPENDENT_AMBULATORY_CARE_PROVIDER_SITE_OTHER): Payer: BLUE CROSS/BLUE SHIELD | Admitting: Physician Assistant

## 2017-10-24 ENCOUNTER — Encounter: Payer: Self-pay | Admitting: Physician Assistant

## 2017-10-24 VITALS — BP 122/72 | HR 87 | Temp 98.3°F | Resp 17 | Ht 62.0 in | Wt 116.0 lb

## 2017-10-24 DIAGNOSIS — B9789 Other viral agents as the cause of diseases classified elsewhere: Secondary | ICD-10-CM | POA: Diagnosis not present

## 2017-10-24 DIAGNOSIS — J988 Other specified respiratory disorders: Secondary | ICD-10-CM

## 2017-10-24 MED ORDER — MAGIC MOUTHWASH W/LIDOCAINE: 5.0000 mL | Freq: Four times a day (QID) | ORAL | 0 refills | Status: DC | PRN

## 2017-10-24 MED ORDER — GUAIFENESIN ER 1200 MG PO TB12: 1.0000 | ORAL_TABLET | Freq: Two times a day (BID) | ORAL | 1 refills | Status: DC | PRN

## 2017-10-24 NOTE — Progress Notes (Addendum)
[facility] AT [facility] [address], [place] [id number] [phone]  Date:  10/24/2017   Name:  Ruth Harrison   DOB:  07/23/1966   MRN:  [id number]  PCP:  Porfirio OarJeffery, Chelle, PA-C    History of Present Illness:  Ruth Harrison is a 51 y.o. female patient who presents to PCP with  Chief Complaint  Patient presents with  . Sore Throat  . Cough     Patient reports that she has had throat pain for 6 days.   She states that she feels awful.  She has ear pain of her left side.  Subjective fever and chills.   Cough is minimal and non-productive.   [facility]  She states that the strep was negative, however they did give her cefdinir at that time, for sinusitis  Patient Active Problem List   Diagnosis Date Noted  . Environmental allergies 08/17/2017  . Polyarthralgia 05/23/2017  . Heel cord tightness, right 01/26/2017  . Plantar fasciitis 01/26/2017  . Tightness of heel cord, left 01/26/2017  . Vitamin D insufficiency 01/04/2017  . History of endometriosis 01/02/2017  . OSA on CPAP 06/16/2016  . Hx of adenomatous colonic polyps 10/20/2014  . IBS (irritable bowel syndrome) 09/23/2014  . Hematochezia 09/23/2014  . Anal fissure 09/23/2014  . Hemochromatosis 01/31/2012  . PALPITATIONS 07/11/2010  . Chest pain 07/11/2010    Past Medical History:  Diagnosis Date  . Allergy    SEASONAL  . Chronic neck pain   . Colon polyps 2010  . Endometriosis   . Headache   . Hemochromatosis, hereditary (HCC)   . IBS (irritable bowel syndrome)   . Urinary tract infection     Past Surgical History:  Procedure Laterality Date  . ABDOMINAL HYSTERECTOMY  partial   . LAPAROSCOPIC ENDOMETRIOSIS FULGURATION    . SHOULDER SURGERY  2015   right     Social History   Tobacco Use  . Smoking status: Never Smoker  . Smokeless tobacco: Never Used  Substance Use Topics  . Alcohol use: No    Alcohol/week: 0.0 oz  . Drug use: No    Family  History  Adopted: Yes  Problem Relation Age of Onset  . Cancer Brother   . Cancer Brother     Allergies  Allergen Reactions  . Oxycodone Hives  . Penicillins Itching  . Prednisone Shortness Of Breath    Chest pain and double vision  . Valacyclovir Hcl Hives  . Sulfa Antibiotics Other (See Comments)    Unknown reaction not anyphalaxis  . Doxycycline Other (See Comments)    Disturbed sleep and "crazy dreams"    Medication list has been reviewed and updated.  Current Outpatient Medications on File Prior to Visit  Medication Sig Dispense Refill  . Ascorbic Acid (VITAMIN C) 1000 MG tablet Take 1,000 mg by mouth 2 (two) times daily.    . cholecalciferol (VITAMIN D) 1000 units tablet Take 1,000 Units by mouth daily.    . Cholecalciferol (VITAMIN D3) 3000 units TABS Take by mouth.    . dextromethorphan-guaiFENesin (MUCINEX DM) 30-600 MG per 12 hr tablet Take 1 tablet by mouth 2 (two) times daily as needed for cough.    . dicyclomine (BENTYL) 20 MG tablet Take 1 tablet (20 mg total) by mouth 4 (four) times daily -  before meals and at bedtime. 90 tablet 1  . DIGESTIVE ENZYMES PO Take 500 mg by mouth daily  as needed (digestion).     . fluticasone (FLONASE) 50 MCG/ACT nasal spray Place 1 spray into both nostrils daily. (Patient taking differently: Place 1 spray into both nostrils as needed. ) 16 g 3  . loratadine (CLARITIN) 10 MG tablet Take 10 mg by mouth daily as needed.     . Magnesium 250 MG TABS Take by mouth.    . meloxicam (MOBIC) 15 MG tablet Take 1 tablet (15 mg total) daily by mouth. 30 tablet 1  . montelukast (SINGULAIR) 10 MG tablet Take 1 tablet (10 mg total) by mouth daily. 90 tablet 3  . Zinc 50 MG TABS Take 50 mg by mouth 2 (two) times daily.     No current facility-administered medications on file prior to visit.     ROS ROS otherwise unremarkable unless listed above.  Physical Examination: BP 122/72   Pulse 87   Temp 98.3 F (36.8 C) (Oral)   Resp 17   Ht 5\' 2"   (1.575 m)   Wt 116 lb (52.6 kg)   SpO2 98%   BMI 21.22 kg/m  Ideal Body Weight: Weight in (lb) to have BMI = 25: 136.4  Physical Exam  Constitutional: She is oriented to person, place, and time. She appears well-developed and well-nourished. No distress.  HENT:  Head: Normocephalic and atraumatic.  Right Ear: Tympanic membrane, external ear and ear canal normal.  Left Ear: Tympanic membrane, external ear and ear canal normal.  Nose: Mucosal edema and rhinorrhea present. Right sinus exhibits no maxillary sinus tenderness and no frontal sinus tenderness. Left sinus exhibits no maxillary sinus tenderness and no frontal sinus tenderness.  Mouth/Throat: No uvula swelling. Posterior oropharyngeal erythema (mild) present. No oropharyngeal exudate or posterior oropharyngeal edema.  Eyes: Conjunctivae and EOM are normal. Pupils are equal, round, and reactive to light.  Cardiovascular: Normal rate and regular rhythm. Exam reveals no gallop, no distant heart sounds and no friction rub.  No murmur heard. Pulmonary/Chest: Effort normal. No respiratory distress. She has no decreased breath sounds. She has no wheezes. She has no rhonchi.  Lymphadenopathy:       Head (right side): No submandibular, no tonsillar, no preauricular and no posterior auricular adenopathy present.       Head (left side): No submandibular, no tonsillar, no preauricular and no posterior auricular adenopathy present.  Neurological: She is alert and oriented to person, place, and time.  Skin: She is not diaphoretic.  Psychiatric: She has a normal mood and affect. Her behavior is normal.     Assessment and Plan: Ruth Harrison is a 51 y.o. female who is here today for cc of  Chief Complaint  Patient presents with  . Sore Throat  . Cough  she can continue to take the abx [facility], but this is likely viral at this time.  Supportive treatment and use of nsaid discussed.   Viral respiratory illness -  Plan: Guaifenesin (MUCINEX MAXIMUM STRENGTH) 1200 MG TB12, magic mouthwash w/lidocaine SOLN  Trena PlattStephanie Michaele Amundson, PA-C [facility] 11/27/20188:30 AM

## 2017-10-24 NOTE — Patient Instructions (Addendum)
I think this is likely viral.  You have a lot of allergies including valacyclovir and prednisone so options are few to get you comfortable.   Please take ibuprofen or tylenol for the pain.  I have ordered magic mouth wash to help with some of the pain you are having.  Gargle, do not swallow.   Taking ibuprofen with food, and 600mg  every 8 hours, should keep you comfortable, rest and good hydration.   A virus will have to run it's course, and takes about 7-10 days to complete.    Viral Respiratory Infection A viral respiratory infection is an illness that affects parts of the body used for breathing, like the lungs, nose, and throat. It is caused by a germ called a virus. Some examples of this kind of infection are:  A cold.  The flu (influenza).  A respiratory syncytial virus (RSV) infection.  How do I know if I have this infection? Most of the time this infection causes:  A stuffy or runny nose.  Yellow or green fluid in the nose.  A cough.  Sneezing.  Tiredness (fatigue).  Achy muscles.  A sore throat.  Sweating or chills.  A fever.  A headache.  How is this infection treated? If the flu is diagnosed early, it may be treated with an antiviral medicine. This medicine shortens the length of time a person has symptoms. Symptoms may be treated with over-the-counter and prescription medicines, such as:  Expectorants. These make it easier to cough up mucus.  Decongestant nasal sprays.  Doctors do not prescribe antibiotic medicines for viral infections. They do not work with this kind of infection. How do I know if I should stay home? To keep others from getting sick, stay home if you have:  A fever.  A lasting cough.  A sore throat.  A runny nose.  Sneezing.  Muscles aches.  Headaches.  Tiredness.  Weakness.  Chills.  Sweating.  An upset stomach (nausea).  Follow these instructions at home:  Rest as much as possible.  Take over-the-counter and  prescription medicines only as told by your doctor.  Drink enough fluid to keep your pee (urine) clear or pale yellow.  Gargle with salt water. Do this 3-4 times per day or as needed. To make a salt-water mixture, dissolve -1 tsp of salt in 1 cup of warm water. Make sure the salt dissolves all the way.  Use nose drops made from salt water. This helps with stuffiness (congestion). It also helps soften the skin around your nose.  Do not drink alcohol.  Do not use tobacco products, including cigarettes, chewing tobacco, and e-cigarettes. If you need help quitting, ask your doctor. Get help if:  Your symptoms last for 10 days or longer.  Your symptoms get worse over time.  You have a fever.  You have very bad pain in your face or forehead.  Parts of your jaw or neck become very swollen. Get help right away if:  You feel pain or pressure in your chest.  You have shortness of breath.  You faint or feel like you will faint.  You keep throwing up (vomiting).  You feel confused. This information is not intended to replace advice given to you by your health care provider. Make sure you discuss any questions you have with your health care provider. Document Released: 11/02/2008 Document Revised: 04/27/2016 Document Reviewed: 04/28/2015 Elsevier Interactive Patient Education  2018 [employer]    IF you received an x-ray today,  you will receive an invoice from [facility]. Please contact [facility] at [phone] with questions or concerns regarding your invoice.   IF you received labwork today, you will receive an invoice from [facility]. Please contact [facility] at [phone] with questions or concerns regarding your invoice.   Our billing staff will not be able to assist you with questions regarding bills from these companies.  You will be contacted with the lab results as soon as they are available. The fastest way to get your results is to activate your My  Chart account. Instructions are located on the last page of this paperwork. If you have not heard from us regarding the results in 2 weeks, please contact this office.

## 2017-11-01 ENCOUNTER — Telehealth: Payer: Self-pay | Admitting: Family Medicine

## 2017-11-01 DIAGNOSIS — M67432 Ganglion, left wrist: Secondary | ICD-10-CM | POA: Diagnosis not present

## 2017-11-01 NOTE — Telephone Encounter (Unsigned)
Copied from CRM [id number]. Topic: Inquiry >> Nov 01, 2017  9:35 AM Raquel SarnaHayes, Teresa G wrote: Pt is going to Orthopedic Dr today at 2 pm.  She is asking if the Xrays done at the [facility] can be viewed by her Orthopedic Dr. At her visit today thru MyChart or some way? Pt doesn't want to have Xrays done a 2nd time if possible.

## 2017-11-01 NOTE — Telephone Encounter (Signed)
Pt states she has a cyst on her wrist and doesn't need the xray now.

## 2017-11-06 DIAGNOSIS — N3000 Acute cystitis without hematuria: Secondary | ICD-10-CM | POA: Diagnosis not present

## 2017-11-06 DIAGNOSIS — J309 Allergic rhinitis, unspecified: Secondary | ICD-10-CM | POA: Diagnosis not present

## 2017-11-15 DIAGNOSIS — K219 Gastro-esophageal reflux disease without esophagitis: Secondary | ICD-10-CM | POA: Diagnosis not present

## 2017-11-15 DIAGNOSIS — J343 Hypertrophy of nasal turbinates: Secondary | ICD-10-CM | POA: Diagnosis not present

## 2017-11-15 DIAGNOSIS — J329 Chronic sinusitis, unspecified: Secondary | ICD-10-CM | POA: Diagnosis not present

## 2017-11-15 DIAGNOSIS — J302 Other seasonal allergic rhinitis: Secondary | ICD-10-CM | POA: Diagnosis not present

## 2017-11-15 DIAGNOSIS — J342 Deviated nasal septum: Secondary | ICD-10-CM | POA: Diagnosis not present

## 2017-11-15 DIAGNOSIS — J301 Allergic rhinitis due to pollen: Secondary | ICD-10-CM | POA: Diagnosis not present

## 2017-11-15 DIAGNOSIS — J328 Other chronic sinusitis: Secondary | ICD-10-CM | POA: Diagnosis not present

## 2017-11-26 ENCOUNTER — Telehealth: Payer: Self-pay | Admitting: Internal Medicine

## 2017-11-26 NOTE — Telephone Encounter (Signed)
Due to admin day moved 2/13 f/u to AM. Left message for patient. Other appointments remain the same.

## 2017-11-28 DIAGNOSIS — M9902 Segmental and somatic dysfunction of thoracic region: Secondary | ICD-10-CM | POA: Diagnosis not present

## 2017-11-28 DIAGNOSIS — M530 Cervicocranial syndrome: Secondary | ICD-10-CM | POA: Diagnosis not present

## 2017-11-28 DIAGNOSIS — M9901 Segmental and somatic dysfunction of cervical region: Secondary | ICD-10-CM | POA: Diagnosis not present

## 2017-11-28 DIAGNOSIS — M9903 Segmental and somatic dysfunction of lumbar region: Secondary | ICD-10-CM | POA: Diagnosis not present

## 2017-12-11 ENCOUNTER — Other Ambulatory Visit: Payer: Self-pay | Admitting: Physician Assistant

## 2017-12-11 DIAGNOSIS — Z1231 Encounter for screening mammogram for malignant neoplasm of breast: Secondary | ICD-10-CM

## 2017-12-26 DIAGNOSIS — J301 Allergic rhinitis due to pollen: Secondary | ICD-10-CM | POA: Diagnosis not present

## 2017-12-26 DIAGNOSIS — J328 Other chronic sinusitis: Secondary | ICD-10-CM | POA: Diagnosis not present

## 2017-12-26 DIAGNOSIS — J342 Deviated nasal septum: Secondary | ICD-10-CM | POA: Diagnosis not present

## 2017-12-26 DIAGNOSIS — J3489 Other specified disorders of nose and nasal sinuses: Secondary | ICD-10-CM | POA: Diagnosis not present

## 2017-12-26 DIAGNOSIS — J0101 Acute recurrent maxillary sinusitis: Secondary | ICD-10-CM | POA: Diagnosis not present

## 2017-12-26 DIAGNOSIS — J329 Chronic sinusitis, unspecified: Secondary | ICD-10-CM | POA: Diagnosis not present

## 2017-12-26 DIAGNOSIS — J343 Hypertrophy of nasal turbinates: Secondary | ICD-10-CM | POA: Diagnosis not present

## 2018-01-21 ENCOUNTER — Ambulatory Visit
Admission: RE | Admit: 2018-01-21 | Discharge: 2018-01-21 | Disposition: A | Discharge status: Home / Self Care | Payer: BLUE CROSS/BLUE SHIELD | Source: Ambulatory Visit | Attending: Physician Assistant | Admitting: Physician Assistant

## 2018-01-21 DIAGNOSIS — Z1231 Encounter for screening mammogram for malignant neoplasm of breast: Secondary | ICD-10-CM | POA: Diagnosis not present

## 2018-01-21 DIAGNOSIS — M9902 Segmental and somatic dysfunction of thoracic region: Secondary | ICD-10-CM | POA: Diagnosis not present

## 2018-01-21 DIAGNOSIS — M9901 Segmental and somatic dysfunction of cervical region: Secondary | ICD-10-CM | POA: Diagnosis not present

## 2018-01-21 DIAGNOSIS — M530 Cervicocranial syndrome: Secondary | ICD-10-CM | POA: Diagnosis not present

## 2018-01-21 DIAGNOSIS — M9903 Segmental and somatic dysfunction of lumbar region: Secondary | ICD-10-CM | POA: Diagnosis not present

## 2018-01-23 ENCOUNTER — Inpatient Hospital Stay: Payer: BLUE CROSS/BLUE SHIELD | Attending: Internal Medicine

## 2018-01-23 DIAGNOSIS — M542 Cervicalgia: Secondary | ICD-10-CM | POA: Diagnosis not present

## 2018-01-23 DIAGNOSIS — Z87440 Personal history of urinary (tract) infections: Secondary | ICD-10-CM | POA: Diagnosis not present

## 2018-01-23 DIAGNOSIS — K219 Gastro-esophageal reflux disease without esophagitis: Secondary | ICD-10-CM | POA: Diagnosis not present

## 2018-01-23 DIAGNOSIS — Z86010 Personal history of colonic polyps: Secondary | ICD-10-CM | POA: Insufficient documentation

## 2018-01-23 DIAGNOSIS — N809 Endometriosis, unspecified: Secondary | ICD-10-CM | POA: Insufficient documentation

## 2018-01-23 DIAGNOSIS — Z79899 Other long term (current) drug therapy: Secondary | ICD-10-CM | POA: Insufficient documentation

## 2018-01-23 DIAGNOSIS — R51 Headache: Secondary | ICD-10-CM | POA: Diagnosis not present

## 2018-01-23 LAB — CBC WITH DIFFERENTIAL/PLATELET
BASOS ABS: 0 10*3/uL (ref 0.0–0.1)
Basophils Relative: 0 %
EOS PCT: 1 %
Eosinophils Absolute: 0.1 10*3/uL (ref 0.0–0.5)
HCT: 40.3 % (ref 34.8–46.6)
HEMOGLOBIN: 13.4 g/dL (ref 11.6–15.9)
LYMPHS ABS: 2.1 10*3/uL (ref 0.9–3.3)
LYMPHS PCT: 42 %
MCH: 32.7 pg (ref 25.1–34.0)
MCHC: 33.3 g/dL (ref 31.5–36.0)
MCV: 98.3 fL (ref 79.5–101.0)
Monocytes Absolute: 0.3 10*3/uL (ref 0.1–0.9)
Monocytes Relative: 5 %
NEUTROS PCT: 52 %
Neutro Abs: 2.6 10*3/uL (ref 1.5–6.5)
PLATELETS: 164 10*3/uL (ref 145–400)
RBC: 4.1 MIL/uL (ref 3.70–5.45)
RDW: 12.9 % (ref 11.2–14.5)
WBC: 5 10*3/uL (ref 3.9–10.3)

## 2018-01-23 LAB — COMPREHENSIVE METABOLIC PANEL
ALT: 31 U/L (ref 0–55)
ANION GAP: 8 (ref 3–11)
AST: 21 U/L (ref 5–34)
Albumin: 4.2 g/dL (ref 3.5–5.0)
Alkaline Phosphatase: 49 U/L (ref 40–150)
BILIRUBIN TOTAL: 0.4 mg/dL (ref 0.2–1.2)
BUN: 12 mg/dL (ref 7–26)
CO2: 28 mmol/L (ref 22–29)
Calcium: 9.8 mg/dL (ref 8.4–10.4)
Chloride: 106 mmol/L (ref 98–109)
Creatinine, Ser: 0.67 mg/dL (ref 0.60–1.10)
Glucose, Bld: 81 mg/dL (ref 70–140)
POTASSIUM: 4.6 mmol/L (ref 3.5–5.1)
Sodium: 142 mmol/L (ref 136–145)
TOTAL PROTEIN: 7.4 g/dL (ref 6.4–8.3)

## 2018-01-24 LAB — IRON AND TIBC
Iron: 197 ug/dL — ABNORMAL HIGH (ref 41–142)
SATURATION RATIOS: 76 % — AB (ref 21–57)
TIBC: 258 ug/dL (ref 236–444)
UIBC: 61 ug/dL

## 2018-01-24 LAB — FERRITIN: Ferritin: 37 ng/mL (ref 9–269)

## 2018-01-30 ENCOUNTER — Encounter: Payer: Self-pay | Admitting: Internal Medicine

## 2018-01-30 ENCOUNTER — Telehealth: Payer: Self-pay

## 2018-01-30 ENCOUNTER — Inpatient Hospital Stay (HOSPITAL_BASED_OUTPATIENT_CLINIC_OR_DEPARTMENT_OTHER): Payer: BLUE CROSS/BLUE SHIELD | Admitting: Internal Medicine

## 2018-01-30 DIAGNOSIS — Z87440 Personal history of urinary (tract) infections: Secondary | ICD-10-CM

## 2018-01-30 DIAGNOSIS — M542 Cervicalgia: Secondary | ICD-10-CM

## 2018-01-30 DIAGNOSIS — Z79899 Other long term (current) drug therapy: Secondary | ICD-10-CM | POA: Diagnosis not present

## 2018-01-30 DIAGNOSIS — R51 Headache: Secondary | ICD-10-CM | POA: Diagnosis not present

## 2018-01-30 DIAGNOSIS — Z86010 Personal history of colonic polyps: Secondary | ICD-10-CM

## 2018-01-30 DIAGNOSIS — K219 Gastro-esophageal reflux disease without esophagitis: Secondary | ICD-10-CM | POA: Diagnosis not present

## 2018-01-30 DIAGNOSIS — N809 Endometriosis, unspecified: Secondary | ICD-10-CM

## 2018-01-30 NOTE — Telephone Encounter (Signed)
Printed avs and calender of upcoming appointment. Per 2/27 los 

## 2018-01-30 NOTE — Progress Notes (Signed)
[facility] [phone]   [phone]  OFFICE PROGRESS NOTE  Ruth Harrison, Chelle, PA-C 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   PRIOR THERAPY: Weekly phlebotomies x3, last one was in January of 2014.   CURRENT THERAPY: Observation.  INTERVAL HISTORY: Ruth Harrison 52 y.o. female returns to the clinic today for follow-up visit.  The patient is feeling fine today with no specific complaints except for mild arthralgia in her hands.  She denied having any chest pain, shortness breath, cough or hemoptysis.  She denied having any fever or chills.  She has no nausea, vomiting, diarrhea or constipation.  She denied having any bleeding issues.  The patient is here today for evaluation after repeat CBC, iron study, ferritin as well as comprehensive metabolic panel.  MEDICAL HISTORY: Past Medical History:  Diagnosis Date  . Allergy    SEASONAL  . Chronic neck pain   . Colon polyps 2010  . Endometriosis   . Headache   . Hemochromatosis, hereditary (HCC)   . IBS (irritable bowel syndrome)   . Urinary tract infection     ALLERGIES:  is allergic to oxycodone; penicillins; prednisone; valacyclovir hcl; sulfa antibiotics; and doxycycline.  MEDICATIONS:  Current Outpatient Medications  Medication Sig Dispense Refill  . ALL DAY ALLERGY 10 MG tablet Take 1 tablet by mouth daily.  1  . Ascorbic Acid (VITAMIN C) 1000 MG tablet Take 1,000 mg by mouth 2 (two) times daily.    . cholecalciferol (VITAMIN D) 1000 units tablet Take 1,000 Units by mouth daily.    . Cholecalciferol (VITAMIN D3) 3000 units TABS Take by mouth.    . dicyclomine (BENTYL) 20 MG tablet Take 1 tablet (20 mg total) by mouth 4 (four) times daily -  before meals and at bedtime. 90 tablet 1  . DIGESTIVE ENZYMES PO Take 500 mg by mouth daily as needed (digestion).     . fluticasone (FLONASE) 50 MCG/ACT nasal spray Place 1 spray  into both nostrils daily. (Patient taking differently: Place 1 spray into both nostrils as needed. ) 16 g 3  . Magnesium 250 MG TABS Take by mouth.    . Zinc 50 MG TABS Take 50 mg by mouth 2 (two) times daily.     No current facility-administered medications for this visit.     SURGICAL HISTORY:  Past Surgical History:  Procedure Laterality Date  . ABDOMINAL HYSTERECTOMY  partial   . LAPAROSCOPIC ENDOMETRIOSIS FULGURATION    . SHOULDER SURGERY  2015   right     REVIEW OF SYSTEMS:  A comprehensive review of systems was negative except for: Musculoskeletal: positive for arthralgias   PHYSICAL EXAMINATION: General appearance: alert, cooperative and no distress Head: Normocephalic, without obvious abnormality, atraumatic Neck: no adenopathy Lymph nodes: Cervical, supraclavicular, and axillary nodes normal. Resp: clear to auscultation bilaterally Back: symmetric, no curvature. ROM normal. No CVA tenderness. Cardio: regular rate and rhythm, S1, S2 normal, no murmur, click, rub or gallop GI: soft, non-tender; bowel sounds normal; no masses,  no organomegaly Extremities: extremities normal, atraumatic, no cyanosis or edema  ECOG PERFORMANCE STATUS: 0 - Asymptomatic  Blood pressure (!) 100/59, pulse 70, temperature 98.7 F (37.1 C), temperature source Oral, resp. rate 17, weight 110 lb 11.2 oz (50.2 kg), SpO2 99 %.  LABORATORY DATA: Lab Results  Component Value Date   WBC 5.0 01/23/2018   HGB 13.4 01/23/2018   HCT 40.3 01/23/2018  MCV 98.3 01/23/2018   PLT 164 01/23/2018      Chemistry      Component Value Date/Time   NA 142 01/23/2018 1452   NA 140 07/31/2017 1436   K 4.6 01/23/2018 1452   K 4.2 07/31/2017 1436   CL 106 01/23/2018 1452   CL 103 08/15/2012 1600   CO2 28 01/23/2018 1452   CO2 29 07/31/2017 1436   BUN 12 01/23/2018 1452   BUN 9.7 07/31/2017 1436   CREATININE 0.67 01/23/2018 1452   CREATININE 0.8 07/31/2017 1436      Component Value Date/Time    CALCIUM 9.8 01/23/2018 1452   CALCIUM 9.5 07/31/2017 1436   ALKPHOS 49 01/23/2018 1452   ALKPHOS 45 07/31/2017 1436   AST 21 01/23/2018 1452   AST 20 07/31/2017 1436   ALT 31 01/23/2018 1452   ALT 24 07/31/2017 1436   BILITOT 0.4 01/23/2018 1452   BILITOT 0.60 07/31/2017 1436     Ferritin 37, serum iron 197, total iron binding capacity 258, iron saturation 76%.  RADIOGRAPHIC STUDIES: No results found.  ASSESSMENT AND PLAN:  This is a very pleasant 52 years old white female with hereditary hemochromatosis with homozygous C282Y mutation.  The patient underwent several phlebotomies in the past and currently on observation. The patient is doing fine today.  Her ferritin level is 37. I recommended for her to continue on observation for now with repeat CBC, complaints metabolic panel, iron study and ferritin in 6 months. She was advised to call immediately if she has any concerning symptoms in the interval. All questions were answered. The patient knows to call the clinic with any problems, questions or concerns. We can certainly see the patient much sooner if necessary. I spent 10 minutes counseling the patient face to face. The total time spent in the appointment was 15 minutes.  Disclaimer: This note was dictated with voice recognition software. Similar sounding words can inadvertently be transcribed and may be missed upon review.

## 2018-02-07 DIAGNOSIS — M9901 Segmental and somatic dysfunction of cervical region: Secondary | ICD-10-CM | POA: Diagnosis not present

## 2018-02-07 DIAGNOSIS — M530 Cervicocranial syndrome: Secondary | ICD-10-CM | POA: Diagnosis not present

## 2018-02-07 DIAGNOSIS — M9902 Segmental and somatic dysfunction of thoracic region: Secondary | ICD-10-CM | POA: Diagnosis not present

## 2018-02-07 DIAGNOSIS — M9903 Segmental and somatic dysfunction of lumbar region: Secondary | ICD-10-CM | POA: Diagnosis not present

## 2018-02-11 ENCOUNTER — Ambulatory Visit: Payer: BLUE CROSS/BLUE SHIELD | Admitting: Family Medicine

## 2018-03-11 ENCOUNTER — Encounter: Payer: Self-pay | Admitting: Physician Assistant

## 2018-03-13 ENCOUNTER — Encounter: Payer: Self-pay | Admitting: Physician Assistant

## 2018-03-26 DIAGNOSIS — M530 Cervicocranial syndrome: Secondary | ICD-10-CM | POA: Diagnosis not present

## 2018-03-26 DIAGNOSIS — M9902 Segmental and somatic dysfunction of thoracic region: Secondary | ICD-10-CM | POA: Diagnosis not present

## 2018-03-26 DIAGNOSIS — M9903 Segmental and somatic dysfunction of lumbar region: Secondary | ICD-10-CM | POA: Diagnosis not present

## 2018-03-26 DIAGNOSIS — M9901 Segmental and somatic dysfunction of cervical region: Secondary | ICD-10-CM | POA: Diagnosis not present

## 2018-04-04 ENCOUNTER — Ambulatory Visit: Payer: Self-pay

## 2018-04-04 NOTE — Telephone Encounter (Signed)
Pt calling with intermittent "intestinal cramping" Pain is located on the left side of her abdomen located under ribs and to the lower left side. Pt states that the discomfort began last night at dinner time. She stated that she was eating and the pain came on suddenly and doubled her over. She rated the pain a 4-7 on scale of 1-10. Pain is worse after eating. Today she is eating very little but sis get down some yogurt.  Pt mentioned that she has a h/o irritable blowel syndrome and she took a Bentyl. She was pain free for 3 hours then the discomfort gradually came back. She stated she had to "rather large" BM's yesterday then 2 small liquid BM today. She is having no vomiting or nausea, fever or distended abdomen.  Care advice given per protocol. Appt made for tomorrow with PCP.  Reason for Disposition . [1] MODERATE pain (e.g., interferes with normal activities) AND [2] pain comes and goes (cramps) AND [3] present > 24 hours  (Exception: pain with Vomiting or Diarrhea - see that Guideline)  Answer Assessment - Initial Assessment Questions 1. LOCATION: "Where does it hurt?"      Left side under ribs and lower left side 2. RADIATION: "Does the pain shoot anywhere else?" (e.g., chest, back)     No stomach h/o IBS 3. ONSET: "When did the pain begin?" (e.g., minutes, hours or days ago)      Last night 6:30 4. SUDDEN: "Gradual or sudden onset?"     Suddenly - took Bentyl took at 7:30 and by 10:30 pain wwas back 5. PATTERN "Does the pain come and go, or is it constant?"    - If constant: "Is it getting better, staying the same, or worsening?"      (Note: Constant means the pain never goes away completely; most serious pain is constant and it progresses)     - If intermittent: "How long does it last?" "Do you have pain now?"     (Note: Intermittent means the pain goes away completely between bouts)     Intermittent- worse after eating 6. SEVERITY: "How bad is the pain?"  (e.g., Scale 1-10; mild,  moderate, or severe)   - MILD (1-3): doesn't interfere with normal activities, abdomen soft and not tender to touch    - MODERATE (4-7): interferes with normal activities or awakens from sleep, tender to touch    - SEVERE (8-10): excruciating pain, doubled over, unable to do any normal activities     Not as severe as last night- moderate (4-7) 7. RECURRENT SYMPTOM: "Have you ever had this type of abdominal pain before?" If so, ask: "When was the last time?" and "What happened that time?"      Yes- before associated with something she ate but then it passes 8. CAUSE: "What do you think is causing the abdominal pain?"     Pt doesn't know 9. RELIEVING/AGGRAVATING FACTORS: "What makes it better or worse?" (e.g., movement, antacids, bowel movement)     Bentyl helped for 3 hours -limit eating food seems to be a trigger 10. OTHER SYMPTOMS: "Has there been any vomiting, diarrhea, constipation, or urine problems?"       No- had 2 "rather large BM's last night" small amount of liquid BM today 11. PREGNANCY: "Is there any chance you are pregnant?" "When was your last menstrual period?"       No-no longer having menses  Protocols used: ABDOMINAL PAIN - [facility]

## 2018-04-05 ENCOUNTER — Ambulatory Visit: Payer: Self-pay | Admitting: Physician Assistant

## 2018-05-07 DIAGNOSIS — G4733 Obstructive sleep apnea (adult) (pediatric): Secondary | ICD-10-CM | POA: Diagnosis not present

## 2018-05-09 ENCOUNTER — Telehealth: Payer: Self-pay

## 2018-05-09 ENCOUNTER — Telehealth: Payer: Self-pay | Admitting: Physician Assistant

## 2018-05-09 NOTE — Telephone Encounter (Signed)
mychart message sent to pt about making an apt for referral

## 2018-05-09 NOTE — Telephone Encounter (Signed)
Copied from CRM [id number]. Topic: Referral - Request >> May 08, 2018  3:06 PM Floria RavelingStovall, Shana A wrote: Reason for CRM: Pt called in and would like to get a referral to: [facility] [address]  [phone]  Pt stating that she has several Digestive issue and nothing is helping or getting any better.

## 2018-05-09 NOTE — Telephone Encounter (Signed)
Patient needs to be seen for referral, no recent visit with provider for digestive issues. Please call patient to schedule.

## 2018-05-09 NOTE — Telephone Encounter (Signed)
Copied from CRM [id number]. Topic: Referral - Request >> May 09, 2018  3:42 PM Stephannie LiSimmons, Gaius Ishaq L, NT wrote: Reason for CRM: Patient returned the call from the practice and said she does not want to come in again and she has been seen before for this issue and has been prescribed meds for this .And would like a referral without another visit.  Please call her to discuss further at [phone], thanks

## 2018-06-03 ENCOUNTER — Ambulatory Visit: Payer: BLUE CROSS/BLUE SHIELD | Admitting: Family Medicine

## 2018-06-03 ENCOUNTER — Other Ambulatory Visit: Payer: Self-pay

## 2018-06-03 ENCOUNTER — Encounter: Payer: Self-pay | Admitting: Family Medicine

## 2018-06-03 VITALS — BP 94/60 | HR 63 | Temp 98.4°F | Ht 61.0 in | Wt 112.2 lb

## 2018-06-03 DIAGNOSIS — K580 Irritable bowel syndrome with diarrhea: Secondary | ICD-10-CM | POA: Diagnosis not present

## 2018-06-03 DIAGNOSIS — R109 Unspecified abdominal pain: Secondary | ICD-10-CM | POA: Diagnosis not present

## 2018-06-03 DIAGNOSIS — R350 Frequency of micturition: Secondary | ICD-10-CM

## 2018-06-03 DIAGNOSIS — G8929 Other chronic pain: Secondary | ICD-10-CM | POA: Diagnosis not present

## 2018-06-03 DIAGNOSIS — R51 Headache: Secondary | ICD-10-CM | POA: Diagnosis not present

## 2018-06-03 DIAGNOSIS — R197 Diarrhea, unspecified: Secondary | ICD-10-CM

## 2018-06-03 DIAGNOSIS — R110 Nausea: Secondary | ICD-10-CM | POA: Diagnosis not present

## 2018-06-03 DIAGNOSIS — R519 Headache, unspecified: Secondary | ICD-10-CM

## 2018-06-03 LAB — POC MICROSCOPIC URINALYSIS (UMFC): Mucus: ABSENT

## 2018-06-03 LAB — POCT URINALYSIS DIP (MANUAL ENTRY)
BILIRUBIN UA: NEGATIVE
BILIRUBIN UA: NEGATIVE mg/dL
GLUCOSE UA: NEGATIVE mg/dL
Leukocytes, UA: NEGATIVE
NITRITE UA: NEGATIVE
PH UA: 6.5 (ref 5.0–8.0)
Protein Ur, POC: NEGATIVE mg/dL
SPEC GRAV UA: 1.01 (ref 1.010–1.025)
Urobilinogen, UA: 0.2 E.U./dL

## 2018-06-03 MED ORDER — RIFAXIMIN 550 MG PO TABS: 550.0000 mg | ORAL_TABLET | Freq: Three times a day (TID) | ORAL | 0 refills | Status: AC

## 2018-06-03 NOTE — Progress Notes (Signed)
Subjective:    Patient ID: Ruth Harrison, female    DOB: 10/19/1966, 52 y.o.   MRN: [id number]  HPI Ruth Harrison is a 52 y.o. female Presents today for: Chief Complaint  Patient presents with  . digestive issues    eating makes her sick. Going on at least 2 months. everything hurts her tummy   . Headache    since this morning. more concern about the digestive problems    Presents with abdominal pain.  History of IBS by problem list, and on chart review had an abdominal ultrasound 06/18/2015.  Heterogeneous liver echotexture suggesting fatty infiltration and/or hepatocellular disease without focal lesions.  History of hereditary hemochromatosis.  Went to gastroenterology in 2010 - had colonoscopy.  Few polyps, but not concerning. recommended Align, no relief. Had some continued longstanding abdominal issues past 9 years. Some constipation, up to 4-5 days in between BM's  Then would have flares where she would have cramping after meals then diarrhea- about 10 years of these symptoms. Last 2-3 months feels more miserable. Not sure about what to eat as had tried to adjust diet and still having cramping with diarrhea flares.  Cut wheat out of diet about 2 and a half years ago.  Has a reaction with wheat - bread or other items - cramping and diarrhea.  Thinks she had normal celiac testing few years ago at GI. No recent blood in stool. Diarrhea with foul smell at times. Diarrhea yesterday 5-6 times. No vomiting.   No blood in stool. No known sick contacts, no foreign travel.  No recent antibiotics. No recent constipation. Frequent urination recently but no dysuria.  Has not seen GI in past few years.  Some hurting on left side past few months.   Tx: bentyl yesterday x1, none today.  Bentyl makes her dizzy. BP has been on lower side I the past - not new for her.  Headache form not drinking coffee today - concerned it would irritate stomach but did drink some coffee later in day. Hx of migraines.   No focal weakness. HA typical of migraines.  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  Nonsmoker.  No alcohol.   Patient Active Problem List   Diagnosis Date Noted  . Environmental allergies 08/17/2017  . Polyarthralgia 05/23/2017  . Heel cord tightness, right 01/26/2017  . Plantar fasciitis 01/26/2017  . Tightness of heel cord, left 01/26/2017  . Vitamin D insufficiency 01/04/2017  . History of endometriosis 01/02/2017  . OSA on CPAP 06/16/2016  . Hx of adenomatous colonic polyps 10/20/2014  . IBS (irritable bowel syndrome) 09/23/2014  . Hematochezia 09/23/2014  . Anal fissure 09/23/2014  . Hemochromatosis 01/31/2012  . PALPITATIONS 07/11/2010  . Chest pain 07/11/2010   Past Medical History:  Diagnosis Date  . Allergy    SEASONAL  . Chronic neck pain   . Colon polyps 2010  . Endometriosis   . Headache   . Hemochromatosis, hereditary (HCC)   . IBS (irritable bowel syndrome)   . Urinary tract infection    Past Surgical History:  Procedure Laterality Date  . ABDOMINAL HYSTERECTOMY  partial   . LAPAROSCOPIC ENDOMETRIOSIS FULGURATION    . SHOULDER SURGERY  2015   right    Allergies  Allergen Reactions  . Oxycodone Hives  . Penicillins Itching  . Prednisone Shortness Of Breath    Chest pain and double vision  . Valacyclovir Hcl Hives  . Sulfa Antibiotics Other (See Comments)    Unknown reaction  not anyphalaxis  . Doxycycline Other (See Comments)    Disturbed sleep and "crazy dreams"   Prior to Admission medications   Medication Sig Start Date End Date Taking? Authorizing Provider  ALL DAY ALLERGY 10 MG tablet Take 1 tablet by mouth daily. 01/06/18  Yes [provider]  dicyclomine (BENTYL) 20 MG tablet Take 1 tablet (20 mg total) by mouth 4 (four) times daily -  before meals and at bedtime. 08/15/17  Yes Jeffery, Chelle, PA-C  DIGESTIVE ENZYMES PO Take 500 mg by mouth daily as needed (digestion).    Yes [provider]  fluticasone (FLONASE) 50 MCG/ACT  nasal spray Place 1 spray into both nostrils daily. Patient taking differently: Place 1 spray into both nostrils as needed.  06/16/16  Yes Roslynn Amble, MD  montelukast (SINGULAIR) 10 MG tablet Take 10 mg by mouth at bedtime.   Yes [provider]  Ascorbic Acid (VITAMIN C) 1000 MG tablet Take 1,000 mg by mouth 2 (two) times daily.    [provider]  Cholecalciferol (VITAMIN D3) 3000 units TABS Take by mouth.    [provider]  Magnesium 250 MG TABS Take by mouth.    [provider]  Zinc 50 MG TABS Take 50 mg by mouth 2 (two) times daily.    [provider]   Social History   Socioeconomic History  . Marital status: Married    Spouse name: Not on file  . Number of children: 1  . Years of education: Not on file  . Highest education level: Not on file  Occupational History  . Occupation: Geophysical data processor: [employer]  Social Needs  . Financial resource strain: Not on file  . Food insecurity:    Worry: Not on file    Inability: Not on file  . Transportation needs:    Medical: Not on file    Non-medical: Not on file  Tobacco Use  . Smoking status: Never Smoker  . Smokeless tobacco: Never Used  Substance and Sexual Activity  . Alcohol use: No    Alcohol/week: 0.0 oz  . Drug use: No  . Sexual activity: Yes  Lifestyle  . Physical activity:    Days per week: Not on file    Minutes per session: Not on file  . Stress: Not on file  Relationships  . Social connections:    Talks on phone: Not on file    Gets together: Not on file    Attends religious service: Not on file    Active member of club or organization: Not on file    Attends meetings of clubs or organizations: Not on file    Relationship status: Not on file  . Intimate partner violence:    Fear of current or ex partner: Not on file    Emotionally abused: Not on file    Physically abused: Not on file    Forced sexual activity: Not on file    Other Topics Concern  . Not on file  Social History Narrative   Originally from [place]. Lived in [place] most of her life.    Has lived in [place] since 1997.    Has 1 adopted daughter and 3 step-sons.   Lives with her husband and their daughter (and dog, Pumpkin)   Adopted brother died in 3, age 32, of sepsis. He was blind and had multiple handicaps. He was in a nursing home after their mother died, and mostly just  sat.    Review of Systems As in HPI    Objective:   Physical Exam  Constitutional: She is oriented to person, place, and time. She appears well-developed and well-nourished.  HENT:  Head: Normocephalic and atraumatic.  Eyes: Pupils are equal, round, and reactive to light. Conjunctivae and EOM are normal.  Neck: Carotid bruit is not present.  Cardiovascular: Normal rate, regular rhythm, normal heart sounds and intact distal pulses.  Pulmonary/Chest: Effort normal and breath sounds normal.  Abdominal: Soft. Bowel sounds are normal. She exhibits no pulsatile midline mass. There is tenderness (slight suprapubic, no rebound/guarding. ).  No cva ttp.   Neurological: She is alert and oriented to person, place, and time.  Skin: Skin is warm and dry.  Psychiatric: She has a normal mood and affect. Her behavior is normal.  Vitals reviewed.  Vitals:   06/03/18 1440 06/03/18 1446  BP: (!) 97/59 94/60  Pulse: 63   Temp: 98.4 F (36.9 C)   TempSrc: Oral   SpO2: 98%   Weight: 112 lb 3.2 oz (50.9 kg)   Height: 5\' 1"  (1.549 m)    Orthostatic VS for the past 24 hrs (Last 3 readings):  BP- Lying Pulse- Lying BP- Sitting Pulse- Sitting BP- Standing at 0 minutes Pulse- Standing at 0 minutes BP- Standing at 3 minutes Pulse- Standing at 3 minutes  06/03/18 1552 101/62 65 110/66 57 106/66 58 114/67 56    Results for orders placed or performed in visit on 06/03/18  CBC  Result Value Ref Range   WBC 4.2 3.4 - 10.8 x10E3/uL   RBC 3.95 3.77 - 5.28 x10E6/uL   Hemoglobin 13.1 11.1 - 15.9 g/dL    Hematocrit 16.1 09.6 - 46.6 %   MCV 101 (H) 79 - 97 fL   MCH 33.2 (H) 26.6 - 33.0 pg   MCHC 32.8 31.5 - 35.7 g/dL   RDW 04.5 40.9 - 81.1 %   Platelets 161 150 - 450 x10E3/uL  Comprehensive metabolic panel  Result Value Ref Range   Glucose 85 65 - 99 mg/dL   BUN 11 6 - 24 mg/dL   Creatinine, Ser 9.14 0.57 - 1.00 mg/dL   GFR calc non Af Amer 106 >59 mL/min/1.73   GFR calc Af Amer 123 >59 mL/min/1.73   BUN/Creatinine Ratio 19 9 - 23   Sodium 143 134 - 144 mmol/L   Potassium 4.2 3.5 - 5.2 mmol/L   Chloride 106 96 - 106 mmol/L   CO2 26 20 - 29 mmol/L   Calcium 8.9 8.7 - 10.2 mg/dL   Total Protein 6.6 6.0 - 8.5 g/dL   Albumin 4.3 3.5 - 5.5 g/dL   Globulin, Total 2.3 1.5 - 4.5 g/dL   Albumin/Globulin Ratio 1.9 1.2 - 2.2   Bilirubin Total 0.3 0.0 - 1.2 mg/dL   Alkaline Phosphatase 50 39 - 117 IU/L   AST 25 0 - 40 IU/L   ALT 42 (H) 0 - 32 IU/L  Lipase  Result Value Ref Range   Lipase 52 14 - 72 U/L  POCT urinalysis dipstick  Result Value Ref Range   Color, UA yellow yellow   Clarity, UA clear clear   Glucose, UA negative negative mg/dL   Bilirubin, UA negative negative   Ketones, POC UA negative negative mg/dL   Spec Grav, UA 7.829 5.621 - 1.025   Blood, UA trace-lysed (A) negative   pH, UA 6.5 5.0 - 8.0   Protein Ur, POC negative negative mg/dL  Urobilinogen, UA 0.2 0.2 or 1.0 E.U./dL   Nitrite, UA Negative Negative   Leukocytes, UA Negative Negative  POCT Microscopic Urinalysis ([facility])  Result Value Ref Range   WBC,UR,HPF,POC None None WBC/hpf   RBC,UR,HPF,POC None None RBC/hpf   Bacteria Few (A) None, Too numerous to count   Mucus Absent Absent   Epithelial Cells, UR Per Microscopy None None, Too numerous to count cells/hpf       Assessment & Plan:  Ruth Harrison is a 52 y.o. female Chronic abdominal pain - Plan: CBC, Clostridium Difficile by PCR, Comprehensive metabolic panel, Lipase, Ambulatory referral to Gastroenterology, Orthostatic vital signs Diarrhea,  unspecified type - Plan: Clostridium Difficile by PCR, Ambulatory referral to Gastroenterology Nausea without vomiting - Plan: Comprehensive metabolic panel, Lipase, Ambulatory referral to Gastroenterology, Orthostatic vital signs Irritable bowel syndrome with diarrhea - Plan: rifaximin (XIFAXAN) 550 MG TABS tablet  - suspected chronic IBS, diarrhea predominant. Reassuring CBC.   - refer to GI, trial of Xifaxan. Has bentyl if needed  -check C diff with recent increased diarrhea.   - bland diet and symptomatic care.   Urinary frequency - Plan: CBC, POCT urinalysis dipstick, POCT Microscopic Urinalysis ([facility])  - reassuring urinalysis. rtc precautions if persistent or worsening.   Acute nonintractable headache, unspecified headache type  - migraine plus caffiene withdrawal possible.  Symptomatic care and RTC precautions given  Meds ordered this encounter  Medications  . rifaximin (XIFAXAN) 550 MG TABS tablet    Sig: Take 1 tablet (550 mg total) by mouth 3 (three) times daily for 14 days.    Dispense:  42 tablet    Refill:  0   Patient Instructions    I will refer you to gastroenterology.  I will check a blood count, pancreas test, liver test.  Discerning would consider checking some imaging, but other imaging can be discussed with gastroenterology.  Bland diet, avoid spicy foods fried foods for now, make sure you take in sufficient fluids.  Imodium over-the-counter if needed for diarrhea temporarily. Return to the clinic or go to the nearest emergency room if any of your symptoms worsen or new symptoms occur.  You can also try the Xifaxan, as that may help the symptoms from diarrhea predominant IBS.   Headache may be a combination of caffeine withdrawal as well as possible migraine.  If the symptoms are not improving as usual for migraine or any new symptoms, return here or the emergency room right away.    Migraine Headache A migraine headache is an intense, throbbing pain on one side  or both sides of the head. Migraines may also cause other symptoms, such as nausea, vomiting, and sensitivity to light and noise. What are the causes? Doing or taking certain things may also trigger migraines, such as:  Alcohol.  Smoking.  Medicines, such as: ? Medicine used to treat chest pain (nitroglycerine). ? Birth control pills. ? Estrogen pills. ? Certain blood pressure medicines.  Aged cheeses, chocolate, or caffeine.  Foods or drinks that contain nitrates, glutamate, aspartame, or tyramine.  Physical activity.  Other things that may trigger a migraine include:  Menstruation.  Pregnancy.  Hunger.  Stress, lack of sleep, too much sleep, or fatigue.  Weather changes.  What increases the risk? The following factors may make you more likely to experience migraine headaches:  Age. Risk increases with age.  Family history of migraine headaches.  Being Caucasian.  Depression and anxiety.  Obesity.  Being a woman.  Having a hole in  the heart (patent foramen ovale) or other heart problems.  What are the signs or symptoms? The main symptom of this condition is pulsating or throbbing pain. Pain may:  Happen in any area of the head, such as on one side or both sides.  Interfere with daily activities.  Get worse with physical activity.  Get worse with exposure to bright lights or loud noises.  Other symptoms may include:  Nausea.  Vomiting.  Dizziness.  General sensitivity to bright lights, loud noises, or smells.  Before you get a migraine, you may get warning signs that a migraine is developing (aura). An aura may include:  Seeing flashing lights or having blind spots.  Seeing bright spots, halos, or zigzag lines.  Having tunnel vision or blurred vision.  Having numbness or a tingling feeling.  Having trouble talking.  Having muscle weakness.  How is this diagnosed? A migraine headache can be diagnosed based on:  Your symptoms.  A  physical exam.  Tests, such as CT scan or MRI of the head. These imaging tests can help rule out other causes of headaches.  Taking fluid from the spine (lumbar puncture) and analyzing it (cerebrospinal fluid analysis, or CSF analysis).  How is this treated? A migraine headache is usually treated with medicines that:  Relieve pain.  Relieve nausea.  Prevent migraines from coming back.  Treatment may also include:  Acupuncture.  Lifestyle changes like avoiding foods that trigger migraines.  Follow these instructions at home: Medicines  Take over-the-counter and prescription medicines only as told by your health care provider.  Do not drive or use heavy machinery while taking prescription pain medicine.  To prevent or treat constipation while you are taking prescription pain medicine, your health care provider may recommend that you: ? Drink enough fluid to keep your urine clear or pale yellow. ? Take over-the-counter or prescription medicines. ? Eat foods that are high in fiber, such as fresh fruits and vegetables, whole grains, and beans. ? Limit foods that are high in fat and processed sugars, such as fried and sweet foods. Lifestyle  Avoid alcohol use.  Do not use any products that contain nicotine or tobacco, such as cigarettes and e-cigarettes. If you need help quitting, ask your health care provider.  Get at least 8 hours of sleep every night.  Limit your stress. General instructions   Keep a journal to find out what may trigger your migraine headaches. For example, write down: ? What you eat and drink. ? How much sleep you get. ? Any change to your diet or medicines.  If you have a migraine: ? Avoid things that make your symptoms worse, such as bright lights. ? It may help to lie down in a dark, quiet room. ? Do not drive or use heavy machinery. ? Ask your health care provider what activities are safe for you while you are experiencing symptoms.  Keep all  follow-up visits as told by your health care provider. This is important. Contact a health care provider if:  You develop symptoms that are different or more severe than your usual migraine symptoms. Get help right away if:  Your migraine becomes severe.  You have a fever.  You have a stiff neck.  You have vision loss.  Your muscles feel weak or like you cannot control them.  You start to lose your balance often.  You develop trouble walking.  You faint. This information is not intended to replace advice given to you by your  health care provider. Make sure you discuss any questions you have with your health care provider. Document Released: 11/20/2005 Document Revised: 06/09/2016 Document Reviewed: 05/08/2016 Elsevier Interactive Patient Education  2017 [employer]   Abdominal Pain, Adult Abdominal pain can be caused by many things. Often, abdominal pain is not serious and it gets better with no treatment or by being treated at home. However, sometimes abdominal pain is serious. Your health care provider will do a medical history and a physical exam to try to determine the cause of your abdominal pain. Follow these instructions at home:  Take over-the-counter and prescription medicines only as told by your health care provider. Do not take a laxative unless told by your health care provider.  Drink enough fluid to keep your urine clear or pale yellow.  Watch your condition for any changes.  Keep all follow-up visits as told by your health care provider. This is important. Contact a health care provider if:  Your abdominal pain changes or gets worse.  You are not hungry or you lose weight without trying.  You are constipated or have diarrhea for more than 2-3 days.  You have pain when you urinate or have a bowel movement.  Your abdominal pain wakes you up at night.  Your pain gets worse with meals, after eating, or with certain foods.  You are throwing up and  cannot keep anything down.  You have a fever. Get help right away if:  Your pain does not go away as soon as your health care provider told you to expect.  You cannot stop throwing up.  Your pain is only in areas of the abdomen, such as the right side or the left lower portion of the abdomen.  You have bloody or black stools, or stools that look like tar.  You have severe pain, cramping, or bloating in your abdomen.  You have signs of dehydration, such as: ? Dark urine, very little urine, or no urine. ? Cracked lips. ? Dry mouth. ? Sunken eyes. ? Sleepiness. ? Weakness. This information is not intended to replace advice given to you by your health care provider. Make sure you discuss any questions you have with your health care provider. Document Released: 08/30/2005 Document Revised: 06/09/2016 Document Reviewed: 05/03/2016 Elsevier Interactive Patient Education  2018 [employer].    IF you received an x-ray today, you will receive an invoice from [facility]. Please contact [facility] at [phone] with questions or concerns regarding your invoice.   IF you received labwork today, you will receive an invoice from [facility]. Please contact [facility] at [phone] with questions or concerns regarding your invoice.   Our billing staff will not be able to assist you with questions regarding bills from these companies.  You will be contacted with the lab results as soon as they are available. The fastest way to get your results is to activate your My Chart account. Instructions are located on the last page of this paperwork. If you have not heard from [place] regarding the results in 2 weeks, please contact this office.       Signed,   Meredith Staggers, MD Primary Care at [facility].  06/06/18 9:53 AM

## 2018-06-03 NOTE — Patient Instructions (Addendum)
I will refer you to gastroenterology.  I will check a blood count, pancreas test, liver test.  Discerning would consider checking some imaging, but other imaging can be discussed with gastroenterology.  Bland diet, avoid spicy foods fried foods for now, make sure you take in sufficient fluids.  Imodium over-the-counter if needed for diarrhea temporarily. Return to the clinic or go to the nearest emergency room if any of your symptoms worsen or new symptoms occur.  You can also try the Xifaxan, as that may help the symptoms from diarrhea predominant IBS.   Headache may be a combination of caffeine withdrawal as well as possible migraine.  If the symptoms are not improving as usual for migraine or any new symptoms, return here or the emergency room right away.    Migraine Headache A migraine headache is an intense, throbbing pain on one side or both sides of the head. Migraines may also cause other symptoms, such as nausea, vomiting, and sensitivity to light and noise. What are the causes? Doing or taking certain things may also trigger migraines, such as:  Alcohol.  Smoking.  Medicines, such as: ? Medicine used to treat chest pain (nitroglycerine). ? Birth control pills. ? Estrogen pills. ? Certain blood pressure medicines.  Aged cheeses, chocolate, or caffeine.  Foods or drinks that contain nitrates, glutamate, aspartame, or tyramine.  Physical activity.  Other things that may trigger a migraine include:  Menstruation.  Pregnancy.  Hunger.  Stress, lack of sleep, too much sleep, or fatigue.  Weather changes.  What increases the risk? The following factors may make you more likely to experience migraine headaches:  Age. Risk increases with age.  Family history of migraine headaches.  Being Caucasian.  Depression and anxiety.  Obesity.  Being a woman.  Having a hole in the heart (patent foramen ovale) or other heart problems.  What are the signs or  symptoms? The main symptom of this condition is pulsating or throbbing pain. Pain may:  Happen in any area of the head, such as on one side or both sides.  Interfere with daily activities.  Get worse with physical activity.  Get worse with exposure to bright lights or loud noises.  Other symptoms may include:  Nausea.  Vomiting.  Dizziness.  General sensitivity to bright lights, loud noises, or smells.  Before you get a migraine, you may get warning signs that a migraine is developing (aura). An aura may include:  Seeing flashing lights or having blind spots.  Seeing bright spots, halos, or zigzag lines.  Having tunnel vision or blurred vision.  Having numbness or a tingling feeling.  Having trouble talking.  Having muscle weakness.  How is this diagnosed? A migraine headache can be diagnosed based on:  Your symptoms.  A physical exam.  Tests, such as CT scan or MRI of the head. These imaging tests can help rule out other causes of headaches.  Taking fluid from the spine (lumbar puncture) and analyzing it (cerebrospinal fluid analysis, or CSF analysis).  How is this treated? A migraine headache is usually treated with medicines that:  Relieve pain.  Relieve nausea.  Prevent migraines from coming back.  Treatment may also include:  Acupuncture.  Lifestyle changes like avoiding foods that trigger migraines.  Follow these instructions at home: Medicines  Take over-the-counter and prescription medicines only as told by your health care provider.  Do not drive or use heavy machinery while taking prescription pain medicine.  To prevent or treat constipation while you  are taking prescription pain medicine, your health care provider may recommend that you: ? Drink enough fluid to keep your urine clear or pale yellow. ? Take over-the-counter or prescription medicines. ? Eat foods that are high in fiber, such as fresh fruits and vegetables, whole grains,  and beans. ? Limit foods that are high in fat and processed sugars, such as fried and sweet foods. Lifestyle  Avoid alcohol use.  Do not use any products that contain nicotine or tobacco, such as cigarettes and e-cigarettes. If you need help quitting, ask your health care provider.  Get at least 8 hours of sleep every night.  Limit your stress. General instructions   Keep a journal to find out what may trigger your migraine headaches. For example, write down: ? What you eat and drink. ? How much sleep you get. ? Any change to your diet or medicines.  If you have a migraine: ? Avoid things that make your symptoms worse, such as bright lights. ? It may help to lie down in a dark, quiet room. ? Do not drive or use heavy machinery. ? Ask your health care provider what activities are safe for you while you are experiencing symptoms.  Keep all follow-up visits as told by your health care provider. This is important. Contact a health care provider if:  You develop symptoms that are different or more severe than your usual migraine symptoms. Get help right away if:  Your migraine becomes severe.  You have a fever.  You have a stiff neck.  You have vision loss.  Your muscles feel weak or like you cannot control them.  You start to lose your balance often.  You develop trouble walking.  You faint. This information is not intended to replace advice given to you by your health care provider. Make sure you discuss any questions you have with your health care provider. Document Released: 11/20/2005 Document Revised: 06/09/2016 Document Reviewed: 05/08/2016 Elsevier Interactive Patient Education  2017 Elsevier Inc.   Abdominal Pain, Adult Abdominal pain can be caused by many things. Often, abdominal pain is not serious and it gets better with no treatment or by being treated at home. However, sometimes abdominal pain is serious. Your health care provider will do a medical  history and a physical exam to try to determine the cause of your abdominal pain. Follow these instructions at home:  Take over-the-counter and prescription medicines only as told by your health care provider. Do not take a laxative unless told by your health care provider.  Drink enough fluid to keep your urine clear or pale yellow.  Watch your condition for any changes.  Keep all follow-up visits as told by your health care provider. This is important. Contact a health care provider if:  Your abdominal pain changes or gets worse.  You are not hungry or you lose weight without trying.  You are constipated or have diarrhea for more than 2-3 days.  You have pain when you urinate or have a bowel movement.  Your abdominal pain wakes you up at night.  Your pain gets worse with meals, after eating, or with certain foods.  You are throwing up and cannot keep anything down.  You have a fever. Get help right away if:  Your pain does not go away as soon as your health care provider told you to expect.  You cannot stop throwing up.  Your pain is only in areas of the abdomen, such as the right side or  the left lower portion of the abdomen.  You have bloody or black stools, or stools that look like tar.  You have severe pain, cramping, or bloating in your abdomen.  You have signs of dehydration, such as: ? Dark urine, very little urine, or no urine. ? Cracked lips. ? Dry mouth. ? Sunken eyes. ? Sleepiness. ? Weakness. This information is not intended to replace advice given to you by your health care provider. Make sure you discuss any questions you have with your health care provider. Document Released: 08/30/2005 Document Revised: 06/09/2016 Document Reviewed: 05/03/2016 Elsevier Interactive Patient Education  2018 [employer].    IF you received an x-ray today, you will receive an invoice from [facility]. Please contact [facility] at [phone] with  questions or concerns regarding your invoice.   IF you received labwork today, you will receive an invoice from [facility]. Please contact [facility] at [phone] with questions or concerns regarding your invoice.   Our billing staff will not be able to assist you with questions regarding bills from these companies.  You will be contacted with the lab results as soon as they are available. The fastest way to get your results is to activate your My Chart account. Instructions are located on the last page of this paperwork. If you have not heard from [place] regarding the results in 2 weeks, please contact this office.

## 2018-06-04 DIAGNOSIS — G8929 Other chronic pain: Secondary | ICD-10-CM | POA: Diagnosis not present

## 2018-06-04 DIAGNOSIS — R109 Unspecified abdominal pain: Secondary | ICD-10-CM | POA: Diagnosis not present

## 2018-06-04 DIAGNOSIS — R197 Diarrhea, unspecified: Secondary | ICD-10-CM | POA: Diagnosis not present

## 2018-06-04 LAB — COMPREHENSIVE METABOLIC PANEL
ALBUMIN: 4.3 g/dL (ref 3.5–5.5)
ALT: 42 IU/L — AB (ref 0–32)
AST: 25 IU/L (ref 0–40)
Albumin/Globulin Ratio: 1.9 (ref 1.2–2.2)
Alkaline Phosphatase: 50 IU/L (ref 39–117)
BUN / CREAT RATIO: 19 (ref 9–23)
BUN: 11 mg/dL (ref 6–24)
Bilirubin Total: 0.3 mg/dL (ref 0.0–1.2)
CALCIUM: 8.9 mg/dL (ref 8.7–10.2)
CO2: 26 mmol/L (ref 20–29)
CREATININE: 0.59 mg/dL (ref 0.57–1.00)
Chloride: 106 mmol/L (ref 96–106)
GFR, EST AFRICAN AMERICAN: 123 mL/min/{1.73_m2} (ref >59)
GFR, EST NON AFRICAN AMERICAN: 106 mL/min/{1.73_m2} (ref >59)
GLUCOSE: 85 mg/dL (ref 65–99)
Globulin, Total: 2.3 g/dL (ref 1.5–4.5)
Potassium: 4.2 mmol/L (ref 3.5–5.2)
Sodium: 143 mmol/L (ref 134–144)
Total Protein: 6.6 g/dL (ref 6.0–8.5)

## 2018-06-04 LAB — LIPASE: Lipase: 52 U/L (ref 14–72)

## 2018-06-04 LAB — CBC
HEMOGLOBIN: 13.1 g/dL (ref 11.1–15.9)
Hematocrit: 39.9 % (ref 34.0–46.6)
MCH: 33.2 pg — AB (ref 26.6–33.0)
MCHC: 32.8 g/dL (ref 31.5–35.7)
MCV: 101 fL — ABNORMAL HIGH (ref 79–97)
PLATELETS: 161 10*3/uL (ref 150–450)
RBC: 3.95 x10E6/uL (ref 3.77–5.28)
RDW: 12.9 % (ref 12.3–15.4)
WBC: 4.2 10*3/uL (ref 3.4–10.8)

## 2018-06-06 ENCOUNTER — Encounter: Payer: Self-pay | Admitting: Family Medicine

## 2018-06-07 LAB — CLOSTRIDIUM DIFFICILE BY PCR: CDIFFPCR: NEGATIVE

## 2018-06-15 ENCOUNTER — Other Ambulatory Visit: Payer: Self-pay | Admitting: Family Medicine

## 2018-06-15 DIAGNOSIS — K580 Irritable bowel syndrome with diarrhea: Secondary | ICD-10-CM

## 2018-06-15 MED ORDER — MONTELUKAST SODIUM 10 MG PO TABS: 10.0000 mg | ORAL_TABLET | Freq: Every day | ORAL | 1 refills | Status: DC

## 2018-06-15 NOTE — Telephone Encounter (Signed)
Call pt -current prescription should be just wrapping up.  Usually do not combine those back to back but sometimes there may be benefit with repeat dosing if needed in the future. I would like to see how the initial treatment works for the next few weeks or months.  Can also discuss repeat treatment with gastroenterology as well.

## 2018-06-15 NOTE — Telephone Encounter (Signed)
Pt. Called back to find out why she was called. Informed pt of refill request denial. Pt. Expressed confusion at statement "Usually not combined back to back" I could not see what medication was attempting to be refilled by phone message. Based off of conjecture I believe their may be confusion as to which medication she wants refilled, as the only medication I can see was denied was rifaximin. Pt. Stated she does not want a refill of rifaximin.    Pt. Is looking for a refill on singulair. Pt. Asserts medication is for allergies and is to help prevent sinus infection.  Please Advise

## 2018-06-15 NOTE — Telephone Encounter (Signed)
Refill? Or does the pt need a f/u appt for IBS?

## 2018-06-15 NOTE — Telephone Encounter (Signed)
Noted.  Rx for singular has been sent in.

## 2018-06-15 NOTE — Telephone Encounter (Signed)
Attempted to call the patient and relay message below.   No answer and VM not set up yet.

## 2018-06-18 DIAGNOSIS — J018 Other acute sinusitis: Secondary | ICD-10-CM | POA: Diagnosis not present

## 2018-06-24 ENCOUNTER — Other Ambulatory Visit: Payer: Self-pay | Admitting: *Deleted

## 2018-06-24 ENCOUNTER — Telehealth: Payer: Self-pay | Admitting: Family Medicine

## 2018-06-24 DIAGNOSIS — Z9109 Other allergy status, other than to drugs and biological substances: Secondary | ICD-10-CM

## 2018-06-24 MED ORDER — MONTELUKAST SODIUM 10 MG PO TABS: 10.0000 mg | ORAL_TABLET | Freq: Every day | ORAL | 1 refills | Status: DC

## 2018-06-24 NOTE — Telephone Encounter (Signed)
Pt called requesting status of Gastro referral. I called [facility] to check on this, and they stated they did receive referral, however since pt has seen other previous gastro, they thought pt was going there and did not schedule referral. They said they tried calling us but were put on hold for a long time so they hung up. I spoke with pt and relayed this information to her and she said she wanted to try Gastro with [facility] either in [place] or near [place]. I called [facility] in [place] and they were scheduling out into the first week of October. I tried calling [facility] but had to leave a message to call me back. I advised pt of this and she did want me to try [facility] to see if they had anything sooner. I told pt I would call her once I heard from [facility] and if I don't hear anything by Wednesday, I will update her.   Pt also stated she had requested her singulair be switched to mail order with [facility] but was told this was denied. Pt did want to pick up her 30 day prescription that was at the pharmacy, but also get her next prescription through mail order. Raynelle FanningJulie said pt could pick up her prescription, and would give pt 90 day supply in mail order after her 30 day that she picks up. She did tell me to advise pt she will have to come in for an OV after she runs out of the 90 day supply to establish with a doctor for that medication, since Chelle prescribed it originally. Pt said she had already established a few weeks ago, and asked that I have Raynelle FanningJulie not do anything with her prescriptions but that pt was going to find another PCP after referral has been taken care of.

## 2018-06-26 NOTE — Telephone Encounter (Signed)
Spoke with [facility] and was able to get pt scheduled for 07/19/18 at 3:00pm. Pt is to arrive at 2:45pm. Pt is aware.

## 2018-06-27 ENCOUNTER — Telehealth: Payer: Self-pay | Admitting: Internal Medicine

## 2018-06-27 NOTE — Telephone Encounter (Signed)
Patient called to reschedule  °

## 2018-06-28 NOTE — Telephone Encounter (Signed)
Patient needs a call back to find out why the montelukast was sent as a 30 days supply to [facility] when she requested 90 days?

## 2018-07-03 ENCOUNTER — Other Ambulatory Visit: Payer: Self-pay | Admitting: *Deleted

## 2018-07-03 DIAGNOSIS — Z9109 Other allergy status, other than to drugs and biological substances: Secondary | ICD-10-CM

## 2018-07-03 MED ORDER — MONTELUKAST SODIUM 10 MG PO TABS: 10.0000 mg | ORAL_TABLET | Freq: Every day | ORAL | 0 refills | Status: DC

## 2018-07-03 NOTE — Telephone Encounter (Signed)
90 days sent to [facility]

## 2018-07-19 DIAGNOSIS — R198 Other specified symptoms and signs involving the digestive system and abdomen: Secondary | ICD-10-CM | POA: Diagnosis not present

## 2018-07-19 DIAGNOSIS — R1031 Right lower quadrant pain: Secondary | ICD-10-CM | POA: Diagnosis not present

## 2018-07-19 DIAGNOSIS — R1032 Left lower quadrant pain: Secondary | ICD-10-CM | POA: Diagnosis not present

## 2018-07-19 DIAGNOSIS — R1012 Left upper quadrant pain: Secondary | ICD-10-CM | POA: Diagnosis not present

## 2018-07-23 ENCOUNTER — Inpatient Hospital Stay: Payer: BLUE CROSS/BLUE SHIELD | Attending: Internal Medicine

## 2018-07-23 ENCOUNTER — Other Ambulatory Visit: Payer: BLUE CROSS/BLUE SHIELD

## 2018-07-23 DIAGNOSIS — Z86010 Personal history of colonic polyps: Secondary | ICD-10-CM | POA: Diagnosis not present

## 2018-07-23 DIAGNOSIS — K589 Irritable bowel syndrome without diarrhea: Secondary | ICD-10-CM | POA: Insufficient documentation

## 2018-07-23 DIAGNOSIS — Z79899 Other long term (current) drug therapy: Secondary | ICD-10-CM | POA: Diagnosis not present

## 2018-07-23 LAB — CBC WITH DIFFERENTIAL (CANCER CENTER ONLY)
BASOS ABS: 0 10*3/uL (ref 0.0–0.1)
Basophils Relative: 0 %
Eosinophils Absolute: 0.1 10*3/uL (ref 0.0–0.5)
Eosinophils Relative: 1 %
HEMATOCRIT: 42.4 % (ref 34.8–46.6)
Hemoglobin: 13.8 g/dL (ref 11.6–15.9)
LYMPHS ABS: 2.5 10*3/uL (ref 0.9–3.3)
LYMPHS PCT: 40 %
MCH: 32.5 pg (ref 25.1–34.0)
MCHC: 32.5 g/dL (ref 31.5–36.0)
MCV: 99.8 fL (ref 79.5–101.0)
MONO ABS: 0.5 10*3/uL (ref 0.1–0.9)
Monocytes Relative: 8 %
Neutro Abs: 3.2 10*3/uL (ref 1.5–6.5)
Neutrophils Relative %: 51 %
Platelet Count: 171 10*3/uL (ref 145–400)
RBC: 4.25 MIL/uL (ref 3.70–5.45)
RDW: 13 % (ref 11.2–14.5)
WBC Count: 6.3 10*3/uL (ref 3.9–10.3)

## 2018-07-23 LAB — CMP (CANCER CENTER ONLY)
ALBUMIN: 3.9 g/dL (ref 3.5–5.0)
ALT: 31 U/L (ref 0–44)
AST: 17 U/L (ref 15–41)
Alkaline Phosphatase: 46 U/L (ref 38–126)
Anion gap: 7 (ref 5–15)
BILIRUBIN TOTAL: 0.4 mg/dL (ref 0.3–1.2)
BUN: 12 mg/dL (ref 6–20)
CHLORIDE: 104 mmol/L (ref 98–111)
CO2: 31 mmol/L (ref 22–32)
Calcium: 8.6 mg/dL — ABNORMAL LOW (ref 8.9–10.3)
Creatinine: 0.71 mg/dL (ref 0.44–1.00)
GFR, Est AFR Am: >60 mL/min (ref >60)
GFR, Estimated: >60 mL/min (ref >60)
GLUCOSE: 89 mg/dL (ref 70–99)
POTASSIUM: 3.7 mmol/L (ref 3.5–5.1)
SODIUM: 142 mmol/L (ref 135–145)
TOTAL PROTEIN: 7 g/dL (ref 6.5–8.1)

## 2018-07-24 LAB — IRON AND TIBC
Iron: 135 ug/dL (ref 41–142)
SATURATION RATIOS: 46 % (ref 21–57)
TIBC: 291 ug/dL (ref 236–444)
UIBC: 156 ug/dL

## 2018-07-24 LAB — FERRITIN: Ferritin: 27 ng/mL (ref 11–307)

## 2018-07-30 ENCOUNTER — Inpatient Hospital Stay (HOSPITAL_BASED_OUTPATIENT_CLINIC_OR_DEPARTMENT_OTHER): Payer: BLUE CROSS/BLUE SHIELD | Admitting: Internal Medicine

## 2018-07-30 ENCOUNTER — Telehealth: Payer: Self-pay | Admitting: Internal Medicine

## 2018-07-30 ENCOUNTER — Encounter: Payer: Self-pay | Admitting: Internal Medicine

## 2018-07-30 DIAGNOSIS — K589 Irritable bowel syndrome without diarrhea: Secondary | ICD-10-CM | POA: Diagnosis not present

## 2018-07-30 DIAGNOSIS — Z86010 Personal history of colonic polyps: Secondary | ICD-10-CM | POA: Diagnosis not present

## 2018-07-30 DIAGNOSIS — Z79899 Other long term (current) drug therapy: Secondary | ICD-10-CM | POA: Diagnosis not present

## 2018-07-30 NOTE — Progress Notes (Signed)
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   PRIOR THERAPY: Weekly phlebotomies x3, last one was in January of 2014.   CURRENT THERAPY: Observation.  INTERVAL HISTORY: Ruth Harrison 52 y.o. female returns to the clinic today for six-month follow-up visit.  The patient has no complaints today except for the mild arthralgia.  She denied having any fatigue or weakness.  She denied having any nausea, vomiting, diarrhea or constipation.  She has no weight loss or night sweats.  The patient denied having any chest pain, shortness breath, cough or hemoptysis.  She had repeat CBC, complaints metabolic panel, iron study and ferritin performed recently and she is here for evaluation and discussion of her lab results.Marland Kitchen.  MEDICAL HISTORY: Past Medical History:  Diagnosis Date  . Allergy    SEASONAL  . Chronic neck pain   . Colon polyps 2010  . Endometriosis   . Headache   . Hemochromatosis, hereditary (HCC)   . IBS (irritable bowel syndrome)   . Urinary tract infection     ALLERGIES:  is allergic to oxycodone; penicillins; prednisone; valacyclovir hcl; sulfa antibiotics; and doxycycline.  MEDICATIONS:  Current Outpatient Medications  Medication Sig Dispense Refill  . ALL DAY ALLERGY 10 MG tablet Take 1 tablet by mouth daily.  1  . Ascorbic Acid (VITAMIN C) 1000 MG tablet Take 1,000 mg by mouth 2 (two) times daily.    . Cholecalciferol (VITAMIN D3) 3000 units TABS Take by mouth.    . dicyclomine (BENTYL) 20 MG tablet Take 1 tablet (20 mg total) by mouth 4 (four) times daily -  before meals and at bedtime. 90 tablet 1  . DIGESTIVE ENZYMES PO Take 500 mg by mouth daily as needed (digestion).     . fluticasone (FLONASE) 50 MCG/ACT nasal spray Place 1 spray into both nostrils daily. (Patient taking  differently: Place 1 spray into both nostrils as needed. ) 16 g 3  . Magnesium 250 MG TABS Take by mouth.    . montelukast (SINGULAIR) 10 MG tablet Take 1 tablet (10 mg total) by mouth at bedtime. 90 tablet 0  . Zinc 50 MG TABS Take 50 mg by mouth 2 (two) times daily.     No current facility-administered medications for this visit.     SURGICAL HISTORY:  Past Surgical History:  Procedure Laterality Date  . ABDOMINAL HYSTERECTOMY  partial   . LAPAROSCOPIC ENDOMETRIOSIS FULGURATION    . SHOULDER SURGERY  2015   right     REVIEW OF SYSTEMS:  A comprehensive review of systems was negative except for: Musculoskeletal: positive for arthralgias   PHYSICAL EXAMINATION: General appearance: alert, cooperative and no distress Head: Normocephalic, without obvious abnormality, atraumatic Neck: no adenopathy Lymph nodes: Cervical, supraclavicular, and axillary nodes normal. Resp: clear to auscultation bilaterally Back: symmetric, no curvature. ROM normal. No CVA tenderness. Cardio: regular rate and rhythm, S1, S2 normal, no murmur, click, rub or gallop GI: soft, non-tender; bowel sounds normal; no masses,  no organomegaly Extremities: extremities normal, atraumatic, no cyanosis or edema  ECOG PERFORMANCE STATUS: 0 - Asymptomatic  Blood pressure 111/65, pulse 65, temperature 98.2 F (36.8 C), temperature source Oral, resp. rate 18, height 5\' 1"  (1.549 m), weight 114 lb 9.6 oz (52 kg), SpO2 100 %.  LABORATORY DATA: Lab Results  Component Value Date  WBC 6.3 07/23/2018   HGB 13.8 07/23/2018   HCT 42.4 07/23/2018   MCV 99.8 07/23/2018   PLT 171 07/23/2018      Chemistry      Component Value Date/Time   NA 142 07/23/2018 1532   NA 143 06/03/2018 1639   NA 140 07/31/2017 1436   K 3.7 07/23/2018 1532   K 4.2 07/31/2017 1436   CL 104 07/23/2018 1532   CL 103 08/15/2012 1600   CO2 31 07/23/2018 1532   CO2 29 07/31/2017 1436   BUN 12 07/23/2018 1532   BUN 11 06/03/2018 1639   BUN  9.7 07/31/2017 1436   CREATININE 0.71 07/23/2018 1532   CREATININE 0.8 07/31/2017 1436      Component Value Date/Time   CALCIUM 8.6 (L) 07/23/2018 1532   CALCIUM 9.5 07/31/2017 1436   ALKPHOS 46 07/23/2018 1532   ALKPHOS 45 07/31/2017 1436   AST 17 07/23/2018 1532   AST 20 07/31/2017 1436   ALT 31 07/23/2018 1532   ALT 24 07/31/2017 1436   BILITOT 0.4 07/23/2018 1532   BILITOT 0.60 07/31/2017 1436     Ferritin 27, serum iron 135, total iron binding capacity 291, iron saturation 46%.  RADIOGRAPHIC STUDIES: No results found.  ASSESSMENT AND PLAN:  This is a very pleasant 52 years old white female with hereditary hemochromatosis with homozygous C282Y mutation.  The patient underwent several phlebotomies in the past. The patient is currently on observation and she is feeling fine.  Ferritin level is 27 on the recent blood work. I recommended for the patient to continue on observation with repeat CBC, comprehensive metabolic panel, iron study and ferritin in 6 months. She was advised to call immediately if she has any concerning symptoms in the interval. All questions were answered. The patient knows to call the clinic with any problems, questions or concerns. We can certainly see the patient much sooner if necessary. I spent 10 minutes counseling the patient face to face. The total time spent in the appointment was 15 minutes.  Disclaimer: This note was dictated with voice recognition software. Similar sounding words can inadvertently be transcribed and may be missed upon review.

## 2018-07-30 NOTE — Telephone Encounter (Signed)
Gave pt avs and calendar  °

## 2018-08-09 DIAGNOSIS — M9901 Segmental and somatic dysfunction of cervical region: Secondary | ICD-10-CM | POA: Diagnosis not present

## 2018-08-09 DIAGNOSIS — M9903 Segmental and somatic dysfunction of lumbar region: Secondary | ICD-10-CM | POA: Diagnosis not present

## 2018-08-09 DIAGNOSIS — M530 Cervicocranial syndrome: Secondary | ICD-10-CM | POA: Diagnosis not present

## 2018-08-09 DIAGNOSIS — M9902 Segmental and somatic dysfunction of thoracic region: Secondary | ICD-10-CM | POA: Diagnosis not present

## 2018-08-14 DIAGNOSIS — J0101 Acute recurrent maxillary sinusitis: Secondary | ICD-10-CM | POA: Diagnosis not present

## 2018-08-22 DIAGNOSIS — R140 Abdominal distension (gaseous): Secondary | ICD-10-CM | POA: Diagnosis not present

## 2018-08-22 DIAGNOSIS — R194 Change in bowel habit: Secondary | ICD-10-CM | POA: Diagnosis not present

## 2018-08-22 DIAGNOSIS — Z86010 Personal history of colonic polyps: Secondary | ICD-10-CM | POA: Diagnosis not present

## 2018-09-06 ENCOUNTER — Ambulatory Visit: Payer: Self-pay

## 2018-09-06 NOTE — Telephone Encounter (Signed)
Tried call pt no answer and voicemail is not setup to leave a message.   Dgaddy, CMA

## 2018-09-06 NOTE — Telephone Encounter (Signed)
Pt returned call unable to come on Saturday 09/07/18 and want to see Ruth Harrison.  Pt scheduled to see greene on 09/09/18 at 12:00 pm.  Pt agreeable and appreciative.

## 2018-09-06 NOTE — Telephone Encounter (Signed)
Pt. States she has had IBS and has seen GI. Continues to have episodes of abdominal pain,diarrhea,mucus in stool. Today had a severe episode and had blood in her stool. Would like to be seen as soon as possible by Dr. Neva Seat. Denies fever. Spoke with [facility] - will send triage over and they will advise pt.  Answer Assessment - Initial Assessment Questions 1. DIARRHEA SEVERITY: "How bad is the diarrhea?" "How many extra stools have you had in the past 24 hours than normal?"    - NO DIARRHEA (SCALE 0)   - MILD (SCALE 1-3): Few loose or mushy BMs; increase of 1-3 stools over normal daily number of stools; mild increase in ostomy output.   -  MODERATE (SCALE 4-7): Increase of 4-6 stools daily over normal; moderate increase in ostomy output. * SEVERE (SCALE 8-10; OR 'WORST POSSIBLE'): Increase of 7 or more stools daily over normal; moderate increase in ostomy output; incontinence.     1 EPISODE - had mucus and streaked with blood 2. ONSET: "When did the diarrhea begin?"      Has been on going 3. BM CONSISTENCY: "How loose or watery is the diarrhea?"      Watery 4. VOMITING: "Are you also vomiting?" If so, ask: "How many times in the past 24 hours?"      No vomiting 5. ABDOMINAL PAIN: "Are you having any abdominal pain?" If yes: "What does it feel like?" (e.g., crampy, dull, intermittent, constant)      Yes -  6. ABDOMINAL PAIN SEVERITY: If present, ask: "How bad is the pain?"  (e.g., Scale 1-10; mild, moderate, or severe)   - MILD (1-3): doesn't interfere with normal activities, abdomen soft and not tender to touch    - MODERATE (4-7): interferes with normal activities or awakens from sleep, tender to touch    - SEVERE (8-10): excruciating pain, doubled over, unable to do any normal activities       10 7. ORAL INTAKE: If vomiting, "Have you been able to drink liquids?" "How much fluids have you had in the past 24 hours?"     Staying hydrated 8. HYDRATION: "Any signs of dehydration?" (e.g., dry  mouth [not just dry lips], too weak to stand, dizziness, new weight loss) "When did you last urinate?"     No 9. EXPOSURE: "Have you traveled to a foreign country recently?" "Have you been exposed to anyone with diarrhea?" "Could you have eaten any food that was spoiled?"     No 10. ANTIBIOTIC USE: "Are you taking antibiotics now or have you taken antibiotics in the past 2 months?"       No 11. OTHER SYMPTOMS: "Do you have any other symptoms?" (e.g., fever, blood in stool)       Tired and weak 12. PREGNANCY: "Is there any chance you are pregnant?" "When was your last menstrual period?"       No  Protocols used: DIARRHEA-A-AH

## 2018-09-09 ENCOUNTER — Other Ambulatory Visit: Payer: Self-pay

## 2018-09-09 ENCOUNTER — Ambulatory Visit: Payer: BLUE CROSS/BLUE SHIELD | Admitting: Family Medicine

## 2018-09-09 ENCOUNTER — Encounter: Payer: Self-pay | Admitting: Family Medicine

## 2018-09-09 VITALS — BP 103/65 | HR 66 | Temp 98.0°F | Resp 16 | Ht 61.0 in | Wt 117.0 lb

## 2018-09-09 DIAGNOSIS — M9903 Segmental and somatic dysfunction of lumbar region: Secondary | ICD-10-CM | POA: Diagnosis not present

## 2018-09-09 DIAGNOSIS — M9902 Segmental and somatic dysfunction of thoracic region: Secondary | ICD-10-CM | POA: Diagnosis not present

## 2018-09-09 DIAGNOSIS — M9901 Segmental and somatic dysfunction of cervical region: Secondary | ICD-10-CM | POA: Diagnosis not present

## 2018-09-09 DIAGNOSIS — M530 Cervicocranial syndrome: Secondary | ICD-10-CM | POA: Diagnosis not present

## 2018-09-09 DIAGNOSIS — K589 Irritable bowel syndrome without diarrhea: Secondary | ICD-10-CM

## 2018-09-09 NOTE — Patient Instructions (Addendum)
Symptoms still sound like they may be due to IBS, including flare on Friday.  I would recommend probiotic and fiber supplement daily, and restarting IBGard 1 per meal initially, with the option of increasing to 2 per meal.  This dosing can be discussed with your  gastroenterologist. If any return of blood in stool, would recommend calling your gastroenterologist, or follow-up in office for other testing if needed.  Follow-up with gastroenterology as planned, but would recommend discussing next step in management of your symptoms or further work-up if needed.  Please let me know if you have questions in the meantime.   Return to the clinic or go to the nearest emergency room if any of your symptoms worsen or new symptoms occur.  Irritable Bowel Syndrome, Adult Irritable bowel syndrome (IBS) is not one specific disease. It is a group of symptoms that affects the organs responsible for digestion (gastrointestinal or GI tract). To regulate how your GI tract works, your body sends signals back and forth between your intestines and your brain. If you have IBS, there may be a problem with these signals. As a result, your GI tract does not function normally. Your intestines may become more sensitive and overreact to certain things. This is especially true when you eat certain foods or when you are under stress. There are four types of IBS. These may be determined based on the consistency of your stool:  IBS with diarrhea.  IBS with constipation.  Mixed IBS.  Unsubtyped IBS.  It is important to know which type of IBS you have. Some treatments are more likely to be helpful for certain types of IBS. What are the causes? The exact cause of IBS is not known. What increases the risk? You may have a higher risk of IBS if:  You are a woman.  You are younger than 52 years old.  You have a family history of IBS.  You have mental health problems.  You have had bacterial infection of your GI  tract.  What are the signs or symptoms? Symptoms of IBS vary from person to person. The main symptom is abdominal pain or discomfort. Additional symptoms usually include one or more of the following:  Diarrhea, constipation, or both.  Abdominal swelling or bloating.  Feeling full or sick after eating a small or regular-size meal.  Frequent gas.  Mucus in the stool.  A feeling of having more stool left after a bowel movement.  Symptoms tend to come and go. They may be associated with stress, psychiatric conditions, or nothing at all. How is this diagnosed? There is no specific test to diagnose IBS. Your health care provider will make a diagnosis based on a physical exam, medical history, and your symptoms. You may have other tests to rule out other conditions that may be causing your symptoms. These may include:  Blood tests.  X-rays.  CT scan.  Endoscopy and colonoscopy. This is a test in which your GI tract is viewed with a long, thin, flexible tube.  How is this treated? There is no cure for IBS, but treatment can help relieve symptoms. IBS treatment often includes:  Changes to your diet, such as: ? Eating more fiber. ? Avoiding foods that cause symptoms. ? Drinking more water. ? Eating regular, medium-sized portioned meals.  Medicines. These may include: ? Fiber supplements if you have constipation. ? Medicine to control diarrhea (antidiarrheal medicines). ? Medicine to help control muscle spasms in your GI tract (antispasmodic medicines). ? Medicines to  help with any mental health issues, such as antidepressants or tranquilizers.  Therapy. ? Talk therapy may help with anxiety, depression, or other mental health issues that can make IBS symptoms worse.  Stress reduction. ? Managing your stress can help keep symptoms under control.  Follow these instructions at home:  Take medicines only as directed by your health care provider.  Eat a healthy diet. ? Avoid  foods and drinks with added sugar. ? Include more whole grains, fruits, and vegetables gradually into your diet. This may be especially helpful if you have IBS with constipation. ? Avoid any foods and drinks that make your symptoms worse. These may include dairy products and caffeinated or carbonated drinks. ? Do not eat large meals. ? Drink enough fluid to keep your urine clear or pale yellow.  Exercise regularly. Ask your health care provider for recommendations of good activities for you.  Keep all follow-up visits as directed by your health care provider. This is important. Contact a health care provider if:  You have constant pain.  You have trouble or pain with swallowing.  You have worsening diarrhea. Get help right away if:  You have severe and worsening abdominal pain.  You have diarrhea and: ? You have a rash, stiff neck, or severe headache. ? You are irritable, sleepy, or difficult to awaken. ? You are weak, dizzy, or extremely thirsty.  You have bright red blood in your stool or you have black tarry stools.  You have unusual abdominal swelling that is painful.  You vomit continuously.  You vomit blood (hematemesis).  You have both abdominal pain and a fever. This information is not intended to replace advice given to you by your health care provider. Make sure you discuss any questions you have with your health care provider. Document Released: 11/20/2005 Document Revised: 04/21/2016 Document Reviewed: 08/07/2014 Elsevier Interactive Patient Education  [employer].   If you have lab work done today you will be contacted with your lab results within the next 2 weeks.  If you have not heard from [place] then please contact us. The fastest way to get your results is to register for My Chart.   IF you received an x-ray today, you will receive an invoice from [facility]. Please contact [facility] at [phone] with questions or concerns  regarding your invoice.   IF you received labwork today, you will receive an invoice from [facility]. Please contact [facility] at [phone] with questions or concerns regarding your invoice.   Our billing staff will not be able to assist you with questions regarding bills from these companies.  You will be contacted with the lab results as soon as they are available. The fastest way to get your results is to activate your My Chart account. Instructions are located on the last page of this paperwork. If you have not heard from [place] regarding the results in 2 weeks, please contact this office.

## 2018-09-09 NOTE — Progress Notes (Signed)
Subjective:  By signing my name below, I, Ruth Harrison, attest that this documentation has been prepared under the direction and in the presence of Shade Flood, MD Electronically Signed: Charline Bills, ED Scribe 09/09/2018 at 12:36 PM.   Patient ID: Ruth Harrison, female    DOB: 03-04-1966, 52 y.o.   MRN: [id number]  Chief Complaint  Patient presents with  . Abdominal Pain    pt states she has not been better since last OV. Pt states she is still having same symptoms.   . Diarrhea    with blood in stool on Friday    HPI Ruth Harrison is a 52 y.o. female who presents to Primary Care at [facility] for f/u abdominal pain. Last seen 7/1. H/o IBS by problem list. Abdominal pain was discussed in detail last OV, see note. Had recurrent diarrhea, freq urination, occasional L side abdominal pain x months, bentyl used but some dizziness at times. Suspected to have diarrhea per dominant IBS, reassuring CBC, normal UA, lipase. Prescribed xifaxan, bentyl prn, referred to GI and requested other provider at last OV and had neg c.diff testing. Referral was sent to [facility]. She was followed by hematology for hematochromatosis. Seen by [facility] 8/16. Thought to have IBS constipation per dominant. Tried linzess 72 mcg, fiber supplement daily, levsin prn instead of bentyl. Normal celiac panel and TSH. Reportedly 9/19 f/u, she didn't take neither meds due to dizziness. Reported daily BM during the wk, diarrhea every other weekend, improved from prev symptoms. Normal colonoscopy 10/2014. Continued fiber, started IBgard 2 tabs bid, daily probiotics, recheck 4-5 wks with Augustin Schooling PA-C with [facility] [place].  Was taking IBgard x 2 wks, 1 at each meal (3 total), but hasn't taking anything over the past 3 days since severe abdominal cramping, diarrhea, mucous and bright red blood in stools. She was having daily BMs prior due to fiber supplement but didn't have a BM 2 days ago, normal BM yesterday but  reports it was a hard stool. She did note gassiness last night followed by a BM this morning without blood in stools. States she hasn't taken a probiotic in sev yrs as she wasn't aware that she was supposed to restart one. Reports xifaxan did improve diarrhea but caused constipation. Denies fever, appetite change, sick contacts. Pt has a f/u appointment on 10/24.  Patient Active Problem List   Diagnosis Date Noted  . Environmental allergies 08/17/2017  . Polyarthralgia 05/23/2017  . Heel cord tightness, right 01/26/2017  . Plantar fasciitis 01/26/2017  . Tightness of heel cord, left 01/26/2017  . Vitamin D insufficiency 01/04/2017  . History of endometriosis 01/02/2017  . OSA on CPAP 06/16/2016  . Hx of adenomatous colonic polyps 10/20/2014  . IBS (irritable bowel syndrome) 09/23/2014  . Hematochezia 09/23/2014  . Anal fissure 09/23/2014  . Hemochromatosis 01/31/2012  . PALPITATIONS 07/11/2010  . Chest pain 07/11/2010   Past Medical History:  Diagnosis Date  . Allergy    SEASONAL  . Chronic neck pain   . Colon polyps 2010  . Endometriosis   . Headache   . Hemochromatosis, hereditary (HCC)   . IBS (irritable bowel syndrome)   . Urinary tract infection    Past Surgical History:  Procedure Laterality Date  . ABDOMINAL HYSTERECTOMY  partial   . LAPAROSCOPIC ENDOMETRIOSIS FULGURATION    . SHOULDER SURGERY  2015   right    Allergies  Allergen Reactions  . Oxycodone Hives  . Penicillins Itching  .  Prednisone Shortness Of Breath    Chest pain and double vision  . Valacyclovir Hcl Hives  . Sulfa Antibiotics Other (See Comments)    Unknown reaction not anyphalaxis  . Doxycycline Other (See Comments)    Disturbed sleep and "crazy dreams"   Prior to Admission medications   Medication Sig Start Date End Date Taking? Authorizing Provider  ALL DAY ALLERGY 10 MG tablet Take 1 tablet by mouth daily. 01/06/18   [provider]  Ascorbic Acid (VITAMIN C) 1000 MG tablet Take  1,000 mg by mouth 2 (two) times daily.    [provider]  Cholecalciferol (VITAMIN D3) 3000 units TABS Take by mouth.    [provider]  dicyclomine (BENTYL) 20 MG tablet Take 1 tablet (20 mg total) by mouth 4 (four) times daily -  before meals and at bedtime. 08/15/17   [name], PA  DIGESTIVE ENZYMES PO Take 500 mg by mouth daily as needed (digestion).     [provider]  fluticasone (FLONASE) 50 MCG/ACT nasal spray Place 1 spray into both nostrils daily. Patient taking differently: Place 1 spray into both nostrils as needed.  06/16/16   Roslynn Amble, MD  Magnesium 250 MG TABS Take by mouth.    [provider]  montelukast (SINGULAIR) 10 MG tablet Take 1 tablet (10 mg total) by mouth at bedtime. 07/03/18   Myles Lipps, MD  Zinc 50 MG TABS Take 50 mg by mouth 2 (two) times daily.    [provider]   Social History   Socioeconomic History  . Marital status: Married    Spouse name: Not on file  . Number of children: 1  . Years of education: Not on file  . Highest education level: Not on file  Occupational History  . Occupation: Geophysical data processor: [employer]  Social Needs  . Financial resource strain: Not on file  . Food insecurity:    Worry: Not on file    Inability: Not on file  . Transportation needs:    Medical: Not on file    Non-medical: Not on file  Tobacco Use  . Smoking status: Never Smoker  . Smokeless tobacco: Never Used  Substance and Sexual Activity  . Alcohol use: No    Alcohol/week: 0.0 standard drinks  . Drug use: No  . Sexual activity: Yes  Lifestyle  . Physical activity:    Days per week: Not on file    Minutes per session: Not on file  . Stress: Not on file  Relationships  . Social connections:    Talks on phone: Not on file    Gets together: Not on file    Attends religious service: Not on file    Active member of club or organization: Not on file    Attends  meetings of clubs or organizations: Not on file    Relationship status: Not on file  . Intimate partner violence:    Fear of current or ex partner: Not on file    Emotionally abused: Not on file    Physically abused: Not on file    Forced sexual activity: Not on file  Other Topics Concern  . Not on file  Social History Narrative   Originally from [place]. Lived in [place] most of her life.    Has lived in [place] since 1997.    Has 1 adopted daughter and 3 step-sons.   Lives with her husband and their daughter (and  dog, Pumpkin)   Adopted brother died in 87, age 67, of sepsis. He was blind and had multiple handicaps. He was in a nursing home after their mother died, and mostly just sat.   Review of Systems  Constitutional: Negative for appetite change and fever.  Gastrointestinal: Positive for abdominal pain, blood in stool and diarrhea.      Objective:   Physical Exam  Constitutional: She is oriented to person, place, and time. She appears well-developed and well-nourished. No distress.  HENT:  Head: Normocephalic and atraumatic.  Eyes: Conjunctivae and EOM are normal.  Neck: Neck supple. No tracheal deviation present.  Cardiovascular: Normal rate.  Pulmonary/Chest: Effort normal. No respiratory distress.  Abdominal: Soft. There is no tenderness. There is no CVA tenderness.  No high pitched bowel sounds. No focal tenderness.  Musculoskeletal: Normal range of motion.  Neurological: She is alert and oriented to person, place, and time.  Skin: Skin is warm and dry.  Psychiatric: She has a normal mood and affect. Her behavior is normal.  Nursing note and vitals reviewed.  Vitals:   09/09/18 1207  BP: 103/65  Pulse: 66  Resp: 16  Temp: 98 F (36.7 C)  TempSrc: Oral  SpO2: 98%  Weight: 117 lb (53.1 kg)  Height: 5\' 1"  (1.549 m)      Assessment & Plan:  Ruth Harrison is a 52 y.o. female Irritable bowel syndrome, unspecified type Still suspected IBS, previously constipation  predominant.  Episode of diarrhea on Friday with tinges of blood in stool but symptoms improved following day.  Reassuring vital signs and exam.  -Start probiotic, restart IBgard 1 per meal with potential increased dose to 2 pills per meal.  -If any return of blood in stool, call gastroenterology or RTC/ER if needed acutely.  -Keep follow-up with gastroenterology and discussed need to review potential options for meds, or next step in work-up/treatment.  -ER/RTC precautions if acute worsening   No orders of the defined types were placed in this encounter.  Patient Instructions    Symptoms still sound like they may be due to IBS, including flare on Friday.  I would recommend probiotic and fiber supplement daily, and restarting IBGard 1 per meal initially, with the option of increasing to 2 per meal.  This dosing can be discussed with your  gastroenterologist. If any return of blood in stool, would recommend calling your gastroenterologist, or follow-up in office for other testing if needed.  Follow-up with gastroenterology as planned, but would recommend discussing next step in management of your symptoms or further work-up if needed.  Please let me know if you have questions in the meantime.   Return to the clinic or go to the nearest emergency room if any of your symptoms worsen or new symptoms occur.  Irritable Bowel Syndrome, Adult Irritable bowel syndrome (IBS) is not one specific disease. It is a group of symptoms that affects the organs responsible for digestion (gastrointestinal or GI tract). To regulate how your GI tract works, your body sends signals back and forth between your intestines and your brain. If you have IBS, there may be a problem with these signals. As a result, your GI tract does not function normally. Your intestines may become more sensitive and overreact to certain things. This is especially true when you eat certain foods or when you are under stress. There are four  types of IBS. These may be determined based on the consistency of your stool:  IBS with diarrhea.  IBS  with constipation.  Mixed IBS.  Unsubtyped IBS.  It is important to know which type of IBS you have. Some treatments are more likely to be helpful for certain types of IBS. What are the causes? The exact cause of IBS is not known. What increases the risk? You may have a higher risk of IBS if:  You are a woman.  You are younger than 52 years old.  You have a family history of IBS.  You have mental health problems.  You have had bacterial infection of your GI tract.  What are the signs or symptoms? Symptoms of IBS vary from person to person. The main symptom is abdominal pain or discomfort. Additional symptoms usually include one or more of the following:  Diarrhea, constipation, or both.  Abdominal swelling or bloating.  Feeling full or sick after eating a small or regular-size meal.  Frequent gas.  Mucus in the stool.  A feeling of having more stool left after a bowel movement.  Symptoms tend to come and go. They may be associated with stress, psychiatric conditions, or nothing at all. How is this diagnosed? There is no specific test to diagnose IBS. Your health care provider will make a diagnosis based on a physical exam, medical history, and your symptoms. You may have other tests to rule out other conditions that may be causing your symptoms. These may include:  Blood tests.  X-rays.  CT scan.  Endoscopy and colonoscopy. This is a test in which your GI tract is viewed with a long, thin, flexible tube.  How is this treated? There is no cure for IBS, but treatment can help relieve symptoms. IBS treatment often includes:  Changes to your diet, such as: ? Eating more fiber. ? Avoiding foods that cause symptoms. ? Drinking more water. ? Eating regular, medium-sized portioned meals.  Medicines. These may include: ? Fiber supplements if you have  constipation. ? Medicine to control diarrhea (antidiarrheal medicines). ? Medicine to help control muscle spasms in your GI tract (antispasmodic medicines). ? Medicines to help with any mental health issues, such as antidepressants or tranquilizers.  Therapy. ? Talk therapy may help with anxiety, depression, or other mental health issues that can make IBS symptoms worse.  Stress reduction. ? Managing your stress can help keep symptoms under control.  Follow these instructions at home:  Take medicines only as directed by your health care provider.  Eat a healthy diet. ? Avoid foods and drinks with added sugar. ? Include more whole grains, fruits, and vegetables gradually into your diet. This may be especially helpful if you have IBS with constipation. ? Avoid any foods and drinks that make your symptoms worse. These may include dairy products and caffeinated or carbonated drinks. ? Do not eat large meals. ? Drink enough fluid to keep your urine clear or pale yellow.  Exercise regularly. Ask your health care provider for recommendations of good activities for you.  Keep all follow-up visits as directed by your health care provider. This is important. Contact a health care provider if:  You have constant pain.  You have trouble or pain with swallowing.  You have worsening diarrhea. Get help right away if:  You have severe and worsening abdominal pain.  You have diarrhea and: ? You have a rash, stiff neck, or severe headache. ? You are irritable, sleepy, or difficult to awaken. ? You are weak, dizzy, or extremely thirsty.  You have bright red blood in your stool or you have black  tarry stools.  You have unusual abdominal swelling that is painful.  You vomit continuously.  You vomit blood (hematemesis).  You have both abdominal pain and a fever. This information is not intended to replace advice given to you by your health care provider. Make sure you discuss any  questions you have with your health care provider. Document Released: 11/20/2005 Document Revised: 04/21/2016 Document Reviewed: 08/07/2014 Elsevier Interactive Patient Education  [employer].   If you have lab work done today you will be contacted with your lab results within the next 2 weeks.  If you have not heard from [place] then please contact us. The fastest way to get your results is to register for My Chart.   IF you received an x-ray today, you will receive an invoice from [facility]iology. Please contact [facility]iology at [phone] with questions or concerns regarding your invoice.   IF you received labwork today, you will receive an invoice from [place]. Please contact [facility] at [phone] with questions or concerns regarding your invoice.   Our billing staff will not be able to assist you with questions regarding bills from these companies.  You will be contacted with the lab results as soon as they are available. The fastest way to get your results is to activate your My Chart account. Instructions are located on the last page of this paperwork. If you have not heard from [place] regarding the results in 2 weeks, please contact this office.       I personally performed the services described in this documentation, which was scribed in my presence. The recorded information has been reviewed and considered for accuracy and completeness, addended by me as needed, and agree with information above.  Signed,   Meredith Staggers, MD Primary Care at [facility].  09/09/18 1:42 PM

## 2018-09-20 ENCOUNTER — Telehealth: Payer: Self-pay | Admitting: *Deleted

## 2018-09-20 ENCOUNTER — Telehealth: Payer: Self-pay | Admitting: Internal Medicine

## 2018-09-20 DIAGNOSIS — M9902 Segmental and somatic dysfunction of thoracic region: Secondary | ICD-10-CM | POA: Diagnosis not present

## 2018-09-20 DIAGNOSIS — M530 Cervicocranial syndrome: Secondary | ICD-10-CM | POA: Diagnosis not present

## 2018-09-20 DIAGNOSIS — M9901 Segmental and somatic dysfunction of cervical region: Secondary | ICD-10-CM | POA: Diagnosis not present

## 2018-09-20 DIAGNOSIS — M9903 Segmental and somatic dysfunction of lumbar region: Secondary | ICD-10-CM | POA: Diagnosis not present

## 2018-09-20 NOTE — Telephone Encounter (Signed)
Unable to contact patient no VM has been set up/ letter/calendar mailed per 10/17 sch msg

## 2018-09-20 NOTE — Telephone Encounter (Signed)
Received vm from patient regarding future appt. Attempted to call back but unable to leave VM as her voice mailbox has not been set up. Pt has appts set up for 01/2019

## 2018-09-30 ENCOUNTER — Telehealth: Payer: Self-pay | Admitting: Medical Oncology

## 2018-09-30 NOTE — Telephone Encounter (Signed)
mailed lab orders ( CBC/diff. CMP, Ferritin , TIBC and Iron)  to pt to be drawn at [facility] or outside lab. Pt cannot afford $500 facility fee to have labs drawn here. Cancelled lab appt here for feb 20th.

## 2018-10-25 DIAGNOSIS — M530 Cervicocranial syndrome: Secondary | ICD-10-CM | POA: Diagnosis not present

## 2018-10-25 DIAGNOSIS — M9903 Segmental and somatic dysfunction of lumbar region: Secondary | ICD-10-CM | POA: Diagnosis not present

## 2018-10-25 DIAGNOSIS — M9902 Segmental and somatic dysfunction of thoracic region: Secondary | ICD-10-CM | POA: Diagnosis not present

## 2018-10-25 DIAGNOSIS — M9901 Segmental and somatic dysfunction of cervical region: Secondary | ICD-10-CM | POA: Diagnosis not present

## 2018-12-16 DIAGNOSIS — M9902 Segmental and somatic dysfunction of thoracic region: Secondary | ICD-10-CM | POA: Diagnosis not present

## 2018-12-16 DIAGNOSIS — M9901 Segmental and somatic dysfunction of cervical region: Secondary | ICD-10-CM | POA: Diagnosis not present

## 2018-12-16 DIAGNOSIS — M530 Cervicocranial syndrome: Secondary | ICD-10-CM | POA: Diagnosis not present

## 2018-12-16 DIAGNOSIS — M9903 Segmental and somatic dysfunction of lumbar region: Secondary | ICD-10-CM | POA: Diagnosis not present

## 2019-01-15 ENCOUNTER — Telehealth: Payer: Self-pay | Admitting: Internal Medicine

## 2019-01-15 NOTE — Telephone Encounter (Signed)
MM PAL 2/25 - moved f/u to 2/27. Left message. Schedule mailed.

## 2019-01-16 DIAGNOSIS — M79662 Pain in left lower leg: Secondary | ICD-10-CM | POA: Diagnosis not present

## 2019-01-16 DIAGNOSIS — M79605 Pain in left leg: Secondary | ICD-10-CM | POA: Diagnosis not present

## 2019-01-20 ENCOUNTER — Encounter: Payer: Self-pay | Admitting: Internal Medicine

## 2019-01-21 ENCOUNTER — Other Ambulatory Visit: Payer: BLUE CROSS/BLUE SHIELD

## 2019-01-21 DIAGNOSIS — Z9109 Other allergy status, other than to drugs and biological substances: Secondary | ICD-10-CM | POA: Diagnosis not present

## 2019-01-21 DIAGNOSIS — K589 Irritable bowel syndrome without diarrhea: Secondary | ICD-10-CM | POA: Diagnosis not present

## 2019-01-23 ENCOUNTER — Other Ambulatory Visit: Payer: BLUE CROSS/BLUE SHIELD

## 2019-01-28 ENCOUNTER — Telehealth: Payer: Self-pay | Admitting: Internal Medicine

## 2019-01-28 ENCOUNTER — Ambulatory Visit: Payer: BLUE CROSS/BLUE SHIELD | Admitting: Internal Medicine

## 2019-01-28 NOTE — Telephone Encounter (Signed)
Returned patient's call re changing 2/27 appointment. Per patient she needs afternoon appointment.  Moved follow up from 2/27 to next available afternoon appointment for 3/17. Left message. Updated schedule mailed.

## 2019-01-30 ENCOUNTER — Ambulatory Visit: Payer: BLUE CROSS/BLUE SHIELD | Admitting: Internal Medicine

## 2019-02-07 DIAGNOSIS — M79605 Pain in left leg: Secondary | ICD-10-CM | POA: Diagnosis not present

## 2019-02-07 DIAGNOSIS — M79662 Pain in left lower leg: Secondary | ICD-10-CM | POA: Diagnosis not present

## 2019-02-10 DIAGNOSIS — M353 Polymyalgia rheumatica: Secondary | ICD-10-CM | POA: Diagnosis not present

## 2019-02-17 ENCOUNTER — Telehealth: Payer: Self-pay | Admitting: Medical Oncology

## 2019-02-17 NOTE — Telephone Encounter (Signed)
When she feels comfortable of coming to the cancer center.  We may actually plan to reschedule all the routine follow-up visit for a few months later.

## 2019-02-17 NOTE — Telephone Encounter (Addendum)
appt cancelled appts due to limiting exposure to COVID. Prescription for labs sent to [facility] for signature then needs to be mailed to pt.

## 2019-02-18 ENCOUNTER — Inpatient Hospital Stay: Payer: BLUE CROSS/BLUE SHIELD | Admitting: Internal Medicine

## 2019-02-19 ENCOUNTER — Telehealth: Payer: Self-pay | Admitting: Medical Oncology

## 2019-02-19 NOTE — Telephone Encounter (Signed)
Mailed lab orders to pt.

## 2019-02-21 DIAGNOSIS — M9902 Segmental and somatic dysfunction of thoracic region: Secondary | ICD-10-CM | POA: Diagnosis not present

## 2019-02-21 DIAGNOSIS — M530 Cervicocranial syndrome: Secondary | ICD-10-CM | POA: Diagnosis not present

## 2019-02-21 DIAGNOSIS — M9903 Segmental and somatic dysfunction of lumbar region: Secondary | ICD-10-CM | POA: Diagnosis not present

## 2019-02-21 DIAGNOSIS — M9901 Segmental and somatic dysfunction of cervical region: Secondary | ICD-10-CM | POA: Diagnosis not present

## 2019-03-20 DIAGNOSIS — H00012 Hordeolum externum right lower eyelid: Secondary | ICD-10-CM | POA: Diagnosis not present

## 2019-03-31 DIAGNOSIS — M9902 Segmental and somatic dysfunction of thoracic region: Secondary | ICD-10-CM | POA: Diagnosis not present

## 2019-03-31 DIAGNOSIS — M530 Cervicocranial syndrome: Secondary | ICD-10-CM | POA: Diagnosis not present

## 2019-03-31 DIAGNOSIS — M9903 Segmental and somatic dysfunction of lumbar region: Secondary | ICD-10-CM | POA: Diagnosis not present

## 2019-03-31 DIAGNOSIS — M9901 Segmental and somatic dysfunction of cervical region: Secondary | ICD-10-CM | POA: Diagnosis not present

## 2019-04-01 DIAGNOSIS — R197 Diarrhea, unspecified: Secondary | ICD-10-CM | POA: Diagnosis not present

## 2019-04-01 DIAGNOSIS — R194 Change in bowel habit: Secondary | ICD-10-CM | POA: Diagnosis not present

## 2019-04-01 DIAGNOSIS — R1012 Left upper quadrant pain: Secondary | ICD-10-CM | POA: Diagnosis not present

## 2019-05-26 DIAGNOSIS — M9903 Segmental and somatic dysfunction of lumbar region: Secondary | ICD-10-CM | POA: Diagnosis not present

## 2019-05-26 DIAGNOSIS — M9901 Segmental and somatic dysfunction of cervical region: Secondary | ICD-10-CM | POA: Diagnosis not present

## 2019-05-26 DIAGNOSIS — M9902 Segmental and somatic dysfunction of thoracic region: Secondary | ICD-10-CM | POA: Diagnosis not present

## 2019-05-26 DIAGNOSIS — M530 Cervicocranial syndrome: Secondary | ICD-10-CM | POA: Diagnosis not present

## 2019-06-02 DIAGNOSIS — Z86010 Personal history of colonic polyps: Secondary | ICD-10-CM | POA: Diagnosis not present

## 2019-06-02 DIAGNOSIS — R198 Other specified symptoms and signs involving the digestive system and abdomen: Secondary | ICD-10-CM | POA: Diagnosis not present

## 2019-06-02 DIAGNOSIS — K2930 Chronic superficial gastritis without bleeding: Secondary | ICD-10-CM | POA: Diagnosis not present

## 2019-06-02 DIAGNOSIS — K2950 Unspecified chronic gastritis without bleeding: Secondary | ICD-10-CM | POA: Diagnosis not present

## 2019-06-02 DIAGNOSIS — R110 Nausea: Secondary | ICD-10-CM | POA: Diagnosis not present

## 2019-06-02 DIAGNOSIS — K319 Disease of stomach and duodenum, unspecified: Secondary | ICD-10-CM | POA: Diagnosis not present

## 2019-06-02 DIAGNOSIS — R1030 Lower abdominal pain, unspecified: Secondary | ICD-10-CM | POA: Diagnosis not present

## 2019-06-02 DIAGNOSIS — R197 Diarrhea, unspecified: Secondary | ICD-10-CM | POA: Diagnosis not present

## 2019-06-02 DIAGNOSIS — K648 Other hemorrhoids: Secondary | ICD-10-CM | POA: Diagnosis not present

## 2019-06-02 DIAGNOSIS — R1084 Generalized abdominal pain: Secondary | ICD-10-CM | POA: Diagnosis not present

## 2019-06-02 DIAGNOSIS — K644 Residual hemorrhoidal skin tags: Secondary | ICD-10-CM | POA: Diagnosis not present

## 2019-06-02 DIAGNOSIS — Z1211 Encounter for screening for malignant neoplasm of colon: Secondary | ICD-10-CM | POA: Diagnosis not present

## 2019-06-16 DIAGNOSIS — M9902 Segmental and somatic dysfunction of thoracic region: Secondary | ICD-10-CM | POA: Diagnosis not present

## 2019-06-16 DIAGNOSIS — M9903 Segmental and somatic dysfunction of lumbar region: Secondary | ICD-10-CM | POA: Diagnosis not present

## 2019-06-16 DIAGNOSIS — M9901 Segmental and somatic dysfunction of cervical region: Secondary | ICD-10-CM | POA: Diagnosis not present

## 2019-06-16 DIAGNOSIS — M530 Cervicocranial syndrome: Secondary | ICD-10-CM | POA: Diagnosis not present

## 2019-07-09 DIAGNOSIS — I700 Atherosclerosis of aorta: Secondary | ICD-10-CM | POA: Diagnosis not present

## 2019-07-09 DIAGNOSIS — R197 Diarrhea, unspecified: Secondary | ICD-10-CM | POA: Diagnosis not present

## 2019-07-09 DIAGNOSIS — R1012 Left upper quadrant pain: Secondary | ICD-10-CM | POA: Diagnosis not present

## 2019-07-09 DIAGNOSIS — K5900 Constipation, unspecified: Secondary | ICD-10-CM | POA: Diagnosis not present

## 2019-08-01 DIAGNOSIS — M9901 Segmental and somatic dysfunction of cervical region: Secondary | ICD-10-CM | POA: Diagnosis not present

## 2019-08-01 DIAGNOSIS — M530 Cervicocranial syndrome: Secondary | ICD-10-CM | POA: Diagnosis not present

## 2019-08-01 DIAGNOSIS — M9902 Segmental and somatic dysfunction of thoracic region: Secondary | ICD-10-CM | POA: Diagnosis not present

## 2019-08-01 DIAGNOSIS — M9903 Segmental and somatic dysfunction of lumbar region: Secondary | ICD-10-CM | POA: Diagnosis not present

## 2019-08-15 ENCOUNTER — Telehealth: Payer: Self-pay | Admitting: Internal Medicine

## 2019-08-15 NOTE — Telephone Encounter (Signed)
Returned patient's phone call regarding 09/15 appointment, per patient's request appointment has been moved to 09/22 due to patient needing to get lab work done first.

## 2019-08-19 ENCOUNTER — Inpatient Hospital Stay: Payer: BLUE CROSS/BLUE SHIELD | Admitting: Internal Medicine

## 2019-08-26 ENCOUNTER — Inpatient Hospital Stay: Payer: BC Managed Care – PPO | Attending: Internal Medicine | Admitting: Internal Medicine

## 2019-08-26 ENCOUNTER — Inpatient Hospital Stay: Payer: BC Managed Care – PPO

## 2019-08-26 ENCOUNTER — Other Ambulatory Visit: Payer: Self-pay

## 2019-08-26 ENCOUNTER — Encounter: Payer: Self-pay | Admitting: Internal Medicine

## 2019-08-26 DIAGNOSIS — M2550 Pain in unspecified joint: Secondary | ICD-10-CM | POA: Diagnosis not present

## 2019-08-26 DIAGNOSIS — R200 Anesthesia of skin: Secondary | ICD-10-CM | POA: Insufficient documentation

## 2019-08-26 DIAGNOSIS — R5383 Other fatigue: Secondary | ICD-10-CM | POA: Insufficient documentation

## 2019-08-26 DIAGNOSIS — Z79899 Other long term (current) drug therapy: Secondary | ICD-10-CM | POA: Insufficient documentation

## 2019-08-26 DIAGNOSIS — K589 Irritable bowel syndrome without diarrhea: Secondary | ICD-10-CM | POA: Insufficient documentation

## 2019-08-26 DIAGNOSIS — Z8719 Personal history of other diseases of the digestive system: Secondary | ICD-10-CM | POA: Insufficient documentation

## 2019-08-26 NOTE — Progress Notes (Signed)
Phlebotomy via Left antecubital with 18 ga cath. 500 gm of blood obtained without difficulty over @ 8 min.  Tol well.  VS stable.  Nourishment offered.

## 2019-08-26 NOTE — Progress Notes (Signed)
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   PRIOR THERAPY: Weekly phlebotomies x3, last one was in January of 2014.   CURRENT THERAPY: Phlebotomy on as-needed basis.  INTERVAL HISTORY: Ruth Harrison 53 y.o. female returns to the clinic today for 6 months follow-up visit.  The patient is feeling fine today with no concerning complaints except for fatigue and aching pain as well as some numbness in her hands.  She denied having any current bleeding issues.  She has no nausea, vomiting, diarrhea or constipation.  She denied having any chest pain, shortness of breath, cough or hemoptysis.  She has no headache or visual changes.  She had lab work done at [facility] lab including CBC, comprehensive metabolic panel, iron study and ferritin and she is here today for evaluation and discussion of her lab results and treatment options.  MEDICAL HISTORY: Past Medical History:  Diagnosis Date  . Allergy    SEASONAL  . Chronic neck pain   . Colon polyps 2010  . Endometriosis   . Headache   . Hemochromatosis, hereditary ([place])   . IBS (irritable bowel syndrome)   . Urinary tract infection     ALLERGIES:  is allergic to oxycodone; penicillins; prednisone; valacyclovir hcl; sulfa antibiotics; and doxycycline.  MEDICATIONS:  Current Outpatient Medications  Medication Sig Dispense Refill  . ALL DAY ALLERGY 10 MG tablet Take 1 tablet by mouth daily.  1  . Ascorbic Acid (VITAMIN C) 1000 MG tablet Take 1,000 mg by mouth 2 (two) times daily.    . Cholecalciferol (VITAMIN D3) 3000 units TABS Take by mouth.    . dicyclomine (BENTYL) 20 MG tablet Take 1 tablet (20 mg total) by mouth 4 (four) times daily -  before meals and at bedtime. 90 tablet 1  . DIGESTIVE ENZYMES PO Take 500 mg by mouth daily as needed  (digestion).     . fluticasone (FLONASE) 50 MCG/ACT nasal spray Place 1 spray into both nostrils daily. (Patient taking differently: Place 1 spray into both nostrils as needed. ) 16 g 3  . Magnesium 250 MG TABS Take by mouth.    . montelukast (SINGULAIR) 10 MG tablet Take 1 tablet (10 mg total) by mouth at bedtime. 90 tablet 0  . Zinc 50 MG TABS Take 50 mg by mouth 2 (two) times daily.     No current facility-administered medications for this visit.     SURGICAL HISTORY:  Past Surgical History:  Procedure Laterality Date  . ABDOMINAL HYSTERECTOMY  partial   . LAPAROSCOPIC ENDOMETRIOSIS FULGURATION    . SHOULDER SURGERY  2015   right     REVIEW OF SYSTEMS:  A comprehensive review of systems was negative except for: Constitutional: positive for fatigue Musculoskeletal: positive for arthralgias   PHYSICAL EXAMINATION: General appearance: alert, cooperative, fatigued and no distress Head: Normocephalic, without obvious abnormality, atraumatic Neck: no adenopathy Lymph nodes: Cervical, supraclavicular, and axillary nodes normal. Resp: clear to auscultation bilaterally Back: symmetric, no curvature. ROM normal. No CVA tenderness. Cardio: regular rate and rhythm, S1, S2 normal, no murmur, click, rub or gallop GI: soft, non-tender; bowel sounds normal; no masses,  no organomegaly Extremities: extremities normal, atraumatic, no cyanosis or edema  ECOG PERFORMANCE STATUS: 1 - Symptomatic but completely ambulatory  Blood pressure 102/71, pulse 63, temperature 99.4 F (37.4  C), temperature source Temporal, resp. rate 16, height 5\' 1"  (1.549 m), weight 123 lb 12.8 oz (56.2 kg), SpO2 100 %.  LABORATORY DATA: Lab Results  Component Value Date   WBC 6.3 07/23/2018   HGB 13.8 07/23/2018   HCT 42.4 07/23/2018   MCV 99.8 07/23/2018   PLT 171 07/23/2018      Chemistry      Component Value Date/Time   NA 142 07/23/2018 1532   NA 143 06/03/2018 1639   NA 140 07/31/2017 1436   K 3.7  07/23/2018 1532   K 4.2 07/31/2017 1436   CL 104 07/23/2018 1532   CL 103 08/15/2012 1600   CO2 31 07/23/2018 1532   CO2 29 07/31/2017 1436   BUN 12 07/23/2018 1532   BUN 11 06/03/2018 1639   BUN 9.7 07/31/2017 1436   CREATININE 0.71 07/23/2018 1532   CREATININE 0.8 07/31/2017 1436      Component Value Date/Time   CALCIUM 8.6 (L) 07/23/2018 1532   CALCIUM 9.5 07/31/2017 1436   ALKPHOS 46 07/23/2018 1532   ALKPHOS 45 07/31/2017 1436   AST 17 07/23/2018 1532   AST 20 07/31/2017 1436   ALT 31 07/23/2018 1532   ALT 24 07/31/2017 1436   BILITOT 0.4 07/23/2018 1532   BILITOT 0.60 07/31/2017 1436     Ferritin 80, serum iron 158, total iron binding capacity 258, iron saturation 55%.ALT 62  RADIOGRAPHIC STUDIES: No results found.  ASSESSMENT AND PLAN:  This is a very pleasant 53 years old white female with hereditary hemochromatosis with homozygous C282Y mutation.  The patient is currently on observation and phlebotomy on as-needed basis. She had repeat blood work performed on 08/19/2019 and that showed elevated ferritin level of 80 with increased in the iron saturation to 55%. I recommended for the patient to proceed with phlebotomy today. I will see her back for follow-up visit in 6 months after repeating CBC, comprehensive metabolic panel, iron study and ferritin before her visit. She was advised to call immediately if she has any concerning symptoms in the interval. All questions were answered. The patient knows to call the clinic with any problems, questions or concerns. We can certainly see the patient much sooner if necessary. I spent 10 minutes counseling the patient face to face. The total time spent in the appointment was 15 minutes.  Disclaimer: This note was dictated with voice recognition software. Similar sounding words can inadvertently be transcribed and may be missed upon review.

## 2019-08-26 NOTE — Patient Instructions (Signed)

## 2019-08-27 ENCOUNTER — Telehealth: Payer: Self-pay | Admitting: Internal Medicine

## 2019-08-27 NOTE — Telephone Encounter (Signed)
Scheduled appt per 9/22 los - pt aware of appt date and time   

## 2019-09-03 DIAGNOSIS — M9901 Segmental and somatic dysfunction of cervical region: Secondary | ICD-10-CM | POA: Diagnosis not present

## 2019-09-03 DIAGNOSIS — M9903 Segmental and somatic dysfunction of lumbar region: Secondary | ICD-10-CM | POA: Diagnosis not present

## 2019-09-03 DIAGNOSIS — M9902 Segmental and somatic dysfunction of thoracic region: Secondary | ICD-10-CM | POA: Diagnosis not present

## 2019-09-03 DIAGNOSIS — M530 Cervicocranial syndrome: Secondary | ICD-10-CM | POA: Diagnosis not present

## 2019-09-08 DIAGNOSIS — Z13820 Encounter for screening for osteoporosis: Secondary | ICD-10-CM | POA: Diagnosis not present

## 2019-09-08 DIAGNOSIS — Z1231 Encounter for screening mammogram for malignant neoplasm of breast: Secondary | ICD-10-CM | POA: Diagnosis not present

## 2019-10-15 DIAGNOSIS — M7582 Other shoulder lesions, left shoulder: Secondary | ICD-10-CM | POA: Diagnosis not present

## 2019-10-15 DIAGNOSIS — M7542 Impingement syndrome of left shoulder: Secondary | ICD-10-CM | POA: Diagnosis not present

## 2019-10-15 DIAGNOSIS — M7522 Bicipital tendinitis, left shoulder: Secondary | ICD-10-CM | POA: Diagnosis not present

## 2019-10-15 DIAGNOSIS — S43432A Superior glenoid labrum lesion of left shoulder, initial encounter: Secondary | ICD-10-CM | POA: Diagnosis not present

## 2019-11-03 ENCOUNTER — Encounter: Payer: Self-pay | Admitting: Gastroenterology

## 2019-11-05 DIAGNOSIS — M9902 Segmental and somatic dysfunction of thoracic region: Secondary | ICD-10-CM | POA: Diagnosis not present

## 2019-11-05 DIAGNOSIS — M530 Cervicocranial syndrome: Secondary | ICD-10-CM | POA: Diagnosis not present

## 2019-11-05 DIAGNOSIS — M9901 Segmental and somatic dysfunction of cervical region: Secondary | ICD-10-CM | POA: Diagnosis not present

## 2019-11-05 DIAGNOSIS — M9903 Segmental and somatic dysfunction of lumbar region: Secondary | ICD-10-CM | POA: Diagnosis not present

## 2019-11-18 ENCOUNTER — Telehealth: Payer: Self-pay | Admitting: Cardiovascular Disease

## 2019-11-18 NOTE — Telephone Encounter (Signed)
Called and switched pt to virtual with Kerin Ransom for Friday instead of [facility] not doing virtual appts during clinic.

## 2019-11-18 NOTE — Telephone Encounter (Signed)
New Message  Patient has an appointment with Dr. Gwenlyn Found on 11/21/2019 at 3:30 pm. Patient would like to keep this appointment time and date, but would rather it be virtual. Please call and confirm with patient if this appointment can be changed to virtual and kept at the same date and time.

## 2019-11-19 DIAGNOSIS — M530 Cervicocranial syndrome: Secondary | ICD-10-CM | POA: Diagnosis not present

## 2019-11-19 DIAGNOSIS — M9901 Segmental and somatic dysfunction of cervical region: Secondary | ICD-10-CM | POA: Diagnosis not present

## 2019-11-19 DIAGNOSIS — M9903 Segmental and somatic dysfunction of lumbar region: Secondary | ICD-10-CM | POA: Diagnosis not present

## 2019-11-19 DIAGNOSIS — M9902 Segmental and somatic dysfunction of thoracic region: Secondary | ICD-10-CM | POA: Diagnosis not present

## 2019-11-21 ENCOUNTER — Ambulatory Visit: Payer: BC Managed Care – PPO | Admitting: Cardiovascular Disease

## 2019-11-21 ENCOUNTER — Telehealth (INDEPENDENT_AMBULATORY_CARE_PROVIDER_SITE_OTHER): Payer: BC Managed Care – PPO | Admitting: Cardiology

## 2019-11-21 ENCOUNTER — Encounter: Payer: Self-pay | Admitting: Cardiology

## 2019-11-21 ENCOUNTER — Telehealth: Payer: Self-pay

## 2019-11-21 VITALS — BP 102/69 | HR 63 | Ht 61.0 in | Wt 122.0 lb

## 2019-11-21 DIAGNOSIS — R0789 Other chest pain: Secondary | ICD-10-CM

## 2019-11-21 DIAGNOSIS — Z7189 Other specified counseling: Secondary | ICD-10-CM

## 2019-11-21 DIAGNOSIS — M2550 Pain in unspecified joint: Secondary | ICD-10-CM

## 2019-11-21 DIAGNOSIS — R002 Palpitations: Secondary | ICD-10-CM | POA: Diagnosis not present

## 2019-11-21 DIAGNOSIS — M25519 Pain in unspecified shoulder: Secondary | ICD-10-CM | POA: Diagnosis not present

## 2019-11-21 DIAGNOSIS — M25512 Pain in left shoulder: Secondary | ICD-10-CM

## 2019-11-21 NOTE — Patient Instructions (Addendum)
Medication Instructions:  Your physician recommends that you continue on your current medications as directed. Please refer to the Current Medication list given to you today. *If you need a refill on your cardiac medications before your next appointment, please call your pharmacy*  Lab Work: None If you have labs (blood work) drawn today and your tests are completely normal, you will receive your results only by: . MyChart Message (if you have MyChart) OR . A paper copy in the mail If you have any lab test that is abnormal or we need to change your treatment, we will call you to review the results.  Testing/Procedures: None   Follow-Up: At [facility], you and your health needs are our priority.  As part of our continuing mission to provide you with exceptional heart care, we have created designated Provider Care Teams.  These Care Teams include your primary Cardiologist (physician) and Advanced Practice Providers (APPs -  Physician Assistants and Nurse Practitioners) who all work together to provide you with the care you need, when you need it.  Your next appointment:   12 month(s)  The format for your next appointment:   In Person  Provider:   Jonathan Berry, MD  Other Instructions  

## 2019-11-21 NOTE — Assessment & Plan Note (Signed)
Hereditary hemochromatosis- Followed by Dr Julien Nordmann

## 2019-11-21 NOTE — Assessment & Plan Note (Signed)
Rare short lived palpitations

## 2019-11-21 NOTE — Assessment & Plan Note (Signed)
Previous rheumatologic work up in 2018- "Low suspicion of inflammatory arthritis.  .Inflammatory work-up completed. No additional rheumatology evaluation needed"

## 2019-11-21 NOTE — Assessment & Plan Note (Signed)
Work up at [facility]- Dr Andee Poles- I suspect the patient will need surgery in the near future.

## 2019-11-21 NOTE — Telephone Encounter (Signed)
Left message for patient to contact office to get virtual visit started.  

## 2019-11-21 NOTE — Telephone Encounter (Signed)
Contacted patient to discuss AVS Instructions. Gave patient Luke's recommendations from today's virtual office visit. Informed patient that someone from the scheduling dept will be in contact with them to schedule their follow up appt. Patient voiced understanding and AVS mailed.    

## 2019-11-21 NOTE — Assessment & Plan Note (Signed)
Atypical- normal GXT and echo April 2018

## 2019-11-21 NOTE — Progress Notes (Signed)
Virtual Visit via Telephone Note   This visit type was conducted due to national recommendations for restrictions regarding the COVID-19 Pandemic (e.g. social distancing) in an effort to limit this patient's exposure and mitigate transmission in our community.  Due to her co-morbid illnesses, this patient is at least at moderate risk for complications without adequate follow up.  This format is felt to be most appropriate for this patient at this time.  The patient did not have access to video technology/had technical difficulties with video requiring transitioning to audio format only (telephone).  All issues noted in this document were discussed and addressed.  No physical exam could be performed with this format.  Please refer to the patient's chart for her  consent to telehealth for [facility].   Date:  11/21/2019   ID:  Ruth Harrison, DOB 12/06/1965, MRN [id number]  Patient Location: Home Provider Location: Office  PCP:  [name], PA  Cardiologist:  Dr Allyson Sabal  Electrophysiologist:  None   Evaluation Performed:  Follow-Up Visit  Chief Complaint:  none  History of Present Illness:    Ruth Harrison is a 53 y.o. female with a past history of chest pain which was felt to be atypical and palpitations.  In April 2018 she had a normal echocardiogram and a negative treadmill test.  Her last office visit with Dr. Gery Pray was May 2018.  Other medical issues include hereditary hemochromatosis followed by Dr. Shirline Frees, history of irritable bowel syndrome, and a history of polyarthralgia with a negative rheumatologic work-up in 2018.  The patient was contacted for routine office visit.  In reviewing her records she is also being evaluated at [facility] for left shoulder impingement, in reviewing the orthopedist records it appears she will require surgery at some point in the near future.  From a cardiac standpoint the patient has been doing well.  She says she has occasional left upper chest  tightness which is worse when she lays down.  She has no exertional chest discomfort or shortness of breath.  She does yard work and walks for exercise and occasionally walks on a treadmill.  She has rare palpitations, less than once a month.  This lasts only a minute or so.  Overall she feels like she is doing quite well and has no specific complaints.  The patient does not have symptoms concerning for COVID-19 infection (fever, chills, cough, or new shortness of breath).    Past Medical History:  Diagnosis Date  . Allergy    SEASONAL  . Chronic neck pain   . Colon polyps 2010  . Endometriosis   . Headache   . Hemochromatosis, hereditary ([facility])   . IBS (irritable bowel syndrome)   . Urinary tract infection    Past Surgical History:  Procedure Laterality Date  . ABDOMINAL HYSTERECTOMY  partial   . LAPAROSCOPIC ENDOMETRIOSIS FULGURATION    . SHOULDER SURGERY  2015   right      Current Meds  Medication Sig  . ALL DAY ALLERGY 10 MG tablet Take 1 tablet by mouth daily.  . Ascorbic Acid (VITAMIN C) 1000 MG tablet Take 1,000 mg by mouth 2 (two) times daily.  . Cholecalciferol (VITAMIN D3) 3000 units TABS Take by mouth.  . DIGESTIVE ENZYMES PO Take 500 mg by mouth daily as needed (digestion).   . Magnesium 250 MG TABS Take by mouth.  . montelukast (SINGULAIR) 10 MG tablet Take 1 tablet (10 mg total) by mouth at bedtime.  Marland Kitchen Zinc  50 MG TABS Take 50 mg by mouth daily.      Allergies:   Oxycodone, Penicillins, Prednisone, Valacyclovir hcl, Sulfa antibiotics, and Doxycycline   Social History   Tobacco Use  . Smoking status: Never Smoker  . Smokeless tobacco: Never Used  Substance Use Topics  . Alcohol use: No    Alcohol/week: 0.0 standard drinks  . Drug use: No     Family Hx: The patient's family history includes Cancer in her brother and brother. She was adopted.  ROS:   Please see the history of present illness.    All other systems reviewed and are negative.   Prior CV  studies:   The following studies were reviewed today:  GXT April 2018 Echo 2018  Labs/Other Tests and Data Reviewed:    EKG:  No ECG reviewed.  Recent Labs: No results found for requested labs within last 8760 hours.   Recent Lipid Panel Lab Results  Component Value Date/Time   CHOL 164 01/02/2017 01:47 PM   TRIG 49 01/02/2017 01:47 PM   HDL 71 01/02/2017 01:47 PM   CHOLHDL 2.3 01/02/2017 01:47 PM   CHOLHDL 3.1 01/06/2016 04:59 PM   LDLCALC 83 01/02/2017 01:47 PM    Wt Readings from Last 3 Encounters:  11/21/19 122 lb (55.3 kg)  08/26/19 123 lb 12.8 oz (56.2 kg)  09/09/18 117 lb (53.1 kg)     Objective:    Vital Signs:  BP 102/69   Pulse 63   Ht 5\' 1"  (1.549 m)   Wt 122 lb (55.3 kg)   BMI 23.05 kg/m    VITAL SIGNS:  reviewed  ASSESSMENT & PLAN:    Atypical chest pain-  Atypical- normal GXT and echo April 2018  Palpitations- Rare short lived palpitations   Hemachromatosis Hereditary hemochromatosis- Followed by Dr Julien Nordmann  Shoulder pain- Work up at [facility]- Dr Andee Poles- I suspect the patient will need surgery in the near future.   Plan:  I told the patient that I suspect she will need cardiac clearance prior to her shoulder surgery, though this has not yet been scheduled. From a cardiac standpoint she would be an acceptable risk for that surgery without further cardiac work up-except for a pre op EKG.  She will discuss this whit her surgeon when her surgery is scheduled, she declined to come to the office for an EKG.  I told her it could be done anywhere and we could review it remotely.  Dr Gwenlyn Found can see her in a year.   COVID-19 Education: The signs and symptoms of COVID-19 were discussed with the patient and how to seek care for testing (follow up with PCP or arrange E-visit).  The importance of social distancing was discussed today.  Time:   Today, I have spent 15 minutes with the patient with telehealth technology discussing the above problems.      Medication Adjustments/Labs and Tests Ordered: Current medicines are reviewed at length with the patient today.  Concerns regarding medicines are outlined above.   Tests Ordered: No orders of the defined types were placed in this encounter.   Medication Changes: No orders of the defined types were placed in this encounter.   Follow Up:  In Person Dr Gwenlyn Found one year  Signed, Kerin Ransom, Hershal Coria  11/21/2019 8:47 AM    [place]

## 2019-11-21 NOTE — Telephone Encounter (Signed)

## 2019-11-26 DIAGNOSIS — M530 Cervicocranial syndrome: Secondary | ICD-10-CM | POA: Diagnosis not present

## 2019-11-26 DIAGNOSIS — M9902 Segmental and somatic dysfunction of thoracic region: Secondary | ICD-10-CM | POA: Diagnosis not present

## 2019-11-26 DIAGNOSIS — M9903 Segmental and somatic dysfunction of lumbar region: Secondary | ICD-10-CM | POA: Diagnosis not present

## 2019-11-26 DIAGNOSIS — M9901 Segmental and somatic dysfunction of cervical region: Secondary | ICD-10-CM | POA: Diagnosis not present

## 2020-01-28 DIAGNOSIS — M530 Cervicocranial syndrome: Secondary | ICD-10-CM | POA: Diagnosis not present

## 2020-01-28 DIAGNOSIS — M9903 Segmental and somatic dysfunction of lumbar region: Secondary | ICD-10-CM | POA: Diagnosis not present

## 2020-01-28 DIAGNOSIS — M9901 Segmental and somatic dysfunction of cervical region: Secondary | ICD-10-CM | POA: Diagnosis not present

## 2020-01-28 DIAGNOSIS — M9902 Segmental and somatic dysfunction of thoracic region: Secondary | ICD-10-CM | POA: Diagnosis not present

## 2020-01-30 DIAGNOSIS — M2550 Pain in unspecified joint: Secondary | ICD-10-CM | POA: Diagnosis not present

## 2020-01-30 DIAGNOSIS — M722 Plantar fascial fibromatosis: Secondary | ICD-10-CM | POA: Diagnosis not present

## 2020-02-05 DIAGNOSIS — G4733 Obstructive sleep apnea (adult) (pediatric): Secondary | ICD-10-CM | POA: Diagnosis not present

## 2020-02-13 DIAGNOSIS — K580 Irritable bowel syndrome with diarrhea: Secondary | ICD-10-CM | POA: Diagnosis not present

## 2020-02-13 DIAGNOSIS — M2550 Pain in unspecified joint: Secondary | ICD-10-CM | POA: Diagnosis not present

## 2020-02-18 ENCOUNTER — Encounter: Payer: Self-pay | Admitting: Internal Medicine

## 2020-02-19 ENCOUNTER — Other Ambulatory Visit: Payer: Self-pay | Admitting: Medical Oncology

## 2020-02-23 ENCOUNTER — Other Ambulatory Visit: Payer: Self-pay | Admitting: Medical Oncology

## 2020-02-23 ENCOUNTER — Inpatient Hospital Stay: Payer: BC Managed Care – PPO

## 2020-02-23 ENCOUNTER — Inpatient Hospital Stay: Payer: BC Managed Care – PPO | Attending: Internal Medicine | Admitting: Internal Medicine

## 2020-02-23 ENCOUNTER — Other Ambulatory Visit: Payer: Self-pay

## 2020-02-23 ENCOUNTER — Encounter: Payer: Self-pay | Admitting: Internal Medicine

## 2020-02-23 DIAGNOSIS — R5383 Other fatigue: Secondary | ICD-10-CM | POA: Diagnosis not present

## 2020-02-23 DIAGNOSIS — Z79899 Other long term (current) drug therapy: Secondary | ICD-10-CM | POA: Diagnosis not present

## 2020-02-23 DIAGNOSIS — K589 Irritable bowel syndrome without diarrhea: Secondary | ICD-10-CM | POA: Insufficient documentation

## 2020-02-23 DIAGNOSIS — R531 Weakness: Secondary | ICD-10-CM | POA: Insufficient documentation

## 2020-02-23 DIAGNOSIS — Z86010 Personal history of colonic polyps: Secondary | ICD-10-CM | POA: Insufficient documentation

## 2020-02-23 LAB — CBC WITH DIFFERENTIAL (CANCER CENTER ONLY)
Abs Immature Granulocytes: 0.01 10*3/uL (ref 0.00–0.07)
Basophils Absolute: 0 10*3/uL (ref 0.0–0.1)
Basophils Relative: 0 %
Eosinophils Absolute: 0.1 10*3/uL (ref 0.0–0.5)
Eosinophils Relative: 1 %
HCT: 39.9 % (ref 36.0–46.0)
Hemoglobin: 13.1 g/dL (ref 12.0–15.0)
Immature Granulocytes: 0 %
Lymphocytes Relative: 32 %
Lymphs Abs: 1.9 10*3/uL (ref 0.7–4.0)
MCH: 32.2 pg (ref 26.0–34.0)
MCHC: 32.8 g/dL (ref 30.0–36.0)
MCV: 98 fL (ref 80.0–100.0)
Monocytes Absolute: 0.4 10*3/uL (ref 0.1–1.0)
Monocytes Relative: 7 %
Neutro Abs: 3.4 10*3/uL (ref 1.7–7.7)
Neutrophils Relative %: 60 %
Platelet Count: 166 10*3/uL (ref 150–400)
RBC: 4.07 MIL/uL (ref 3.87–5.11)
RDW: 12.8 % (ref 11.5–15.5)
WBC Count: 5.8 10*3/uL (ref 4.0–10.5)
nRBC: 0 % (ref 0.0–0.2)

## 2020-02-23 LAB — IRON AND TIBC
Iron: 110 ug/dL (ref 41–142)
Saturation Ratios: 43 % (ref 21–57)
TIBC: 254 ug/dL (ref 236–444)
UIBC: 144 ug/dL (ref 120–384)

## 2020-02-23 LAB — CMP (CANCER CENTER ONLY)
ALT: 16 U/L (ref 0–44)
AST: 17 U/L (ref 15–41)
Albumin: 3.9 g/dL (ref 3.5–5.0)
Alkaline Phosphatase: 53 U/L (ref 38–126)
Anion gap: 9 (ref 5–15)
BUN: 13 mg/dL (ref 6–20)
CO2: 26 mmol/L (ref 22–32)
Calcium: 9 mg/dL (ref 8.9–10.3)
Chloride: 109 mmol/L (ref 98–111)
Creatinine: 0.69 mg/dL (ref 0.44–1.00)
GFR, Est AFR Am: >60 mL/min (ref >60)
GFR, Estimated: >60 mL/min (ref >60)
Glucose, Bld: 96 mg/dL (ref 70–99)
Potassium: 3.7 mmol/L (ref 3.5–5.1)
Sodium: 144 mmol/L (ref 135–145)
Total Bilirubin: 0.3 mg/dL (ref 0.3–1.2)
Total Protein: 6.9 g/dL (ref 6.5–8.1)

## 2020-02-23 LAB — FERRITIN: Ferritin: 34 ng/mL (ref 11–307)

## 2020-02-23 NOTE — Progress Notes (Signed)
[place] Telephone:(336) [phone]   Fax:(336) [phone]  OFFICE PROGRESS NOTE  Harrison Mons, PA [address] [place] [address]  DIAGNOSIS: Hereditary hemochromatosis with homozygous C282Y mutation.   PRIOR THERAPY: Weekly phlebotomies x3, last one was in January of 2014.   CURRENT THERAPY: Phlebotomy on as-needed basis.  INTERVAL HISTORY: Ruth Harrison 54 y.o. female returns to the clinic today for 38-month follow-up visit.  The patient is feeling fine today with no concerning complaints except for generalized fatigue and weakness.  She was treated recently with Medrol Dosepak because of the persistent fatigue and arthralgia.  She denied having any current chest pain, shortness of breath, cough or hemoptysis.  She denied having any fever or chills.  She has no nausea, vomiting, diarrhea or constipation.  She denied having any headache or visual changes.  She is here today for evaluation with repeat CBC, comprehensive metabolic panel, iron study and ferritin.  MEDICAL HISTORY: Past Medical History:  Diagnosis Date  . Allergy    SEASONAL  . Chronic neck pain   . Colon polyps 2010  . Endometriosis   . Headache   . Hemochromatosis, hereditary ([place])   . IBS (irritable bowel syndrome)   . Urinary tract infection     ALLERGIES:  is allergic to oxycodone; penicillins; prednisone; valacyclovir hcl; sulfa antibiotics; and doxycycline.  MEDICATIONS:  Current Outpatient Medications  Medication Sig Dispense Refill  . cetirizine (ZYRTEC) 10 MG tablet Take 10 mg by mouth daily.    Marland Kitchen DIGESTIVE ENZYMES PO Take 500 mg by mouth daily as needed (digestion).     . Magnesium 250 MG TABS Take by mouth.    . Zinc 50 MG TABS Take 50 mg by mouth daily.     . Ascorbic Acid (VITAMIN C) 1000 MG tablet Take 1,000 mg by mouth 2 (two) times daily.    Marland Kitchen dicyclomine (BENTYL) 20 MG tablet Take 1 tablet (20 mg total) by mouth 4 (four) times daily -  before meals  and at bedtime. (Patient not taking: Reported on 11/21/2019) 90 tablet 1   No current facility-administered medications for this visit.    SURGICAL HISTORY:  Past Surgical History:  Procedure Laterality Date  . ABDOMINAL HYSTERECTOMY  partial   . LAPAROSCOPIC ENDOMETRIOSIS FULGURATION    . SHOULDER SURGERY  2015   right     REVIEW OF SYSTEMS:  A comprehensive review of systems was negative except for: Constitutional: positive for fatigue Musculoskeletal: positive for arthralgias   PHYSICAL EXAMINATION: General appearance: alert, cooperative, fatigued and no distress Head: Normocephalic, without obvious abnormality, atraumatic Neck: no adenopathy Lymph nodes: Cervical, supraclavicular, and axillary nodes normal. Resp: clear to auscultation bilaterally Back: symmetric, no curvature. ROM normal. No CVA tenderness. Cardio: regular rate and rhythm, S1, S2 normal, no murmur, click, rub or gallop GI: soft, non-tender; bowel sounds normal; no masses,  no organomegaly Extremities: extremities normal, atraumatic, no cyanosis or edema  ECOG PERFORMANCE STATUS: 1 - Symptomatic but completely ambulatory  Blood pressure 111/64, pulse 60, temperature 98.7 F (37.1 C), temperature source Oral, resp. rate 17, height 5\' 1"  (1.549 m), weight 125 lb 9.6 oz (57 kg), SpO2 100 %.  LABORATORY DATA: Lab Results  Component Value Date   WBC 5.8 02/23/2020   HGB 13.1 02/23/2020   HCT 39.9 02/23/2020   MCV 98.0 02/23/2020   PLT 166 02/23/2020      Chemistry      Component Value Date/Time  NA 142 07/23/2018 1532   NA 143 06/03/2018 1639   NA 140 07/31/2017 1436   K 3.7 07/23/2018 1532   K 4.2 07/31/2017 1436   CL 104 07/23/2018 1532   CL 103 08/15/2012 1600   CO2 31 07/23/2018 1532   CO2 29 07/31/2017 1436   BUN 12 07/23/2018 1532   BUN 11 06/03/2018 1639   BUN 9.7 07/31/2017 1436   CREATININE 0.71 07/23/2018 1532   CREATININE 0.8 07/31/2017 1436      Component Value Date/Time    CALCIUM 8.6 (L) 07/23/2018 1532   CALCIUM 9.5 07/31/2017 1436   ALKPHOS 46 07/23/2018 1532   ALKPHOS 45 07/31/2017 1436   AST 17 07/23/2018 1532   AST 20 07/31/2017 1436   ALT 31 07/23/2018 1532   ALT 24 07/31/2017 1436   BILITOT 0.4 07/23/2018 1532   BILITOT 0.60 07/31/2017 1436     Ferritin 34, serum iron 110, total iron binding capacity 254, iron saturation 43%.  RADIOGRAPHIC STUDIES: No results found.  ASSESSMENT AND PLAN:  This is a very pleasant 54 years old white female with hereditary hemochromatosis with homozygous C282Y mutation.  She is currently on observation with phlebotomy on as-needed basis. Repeat CBC, comprehensive metabolic panel and iron study were unremarkable. Ferritin level is 34 today. I recommended for the patient to continue on observation with repeat CBC, iron study, ferritin as well as comprehensive metabolic panel in 6 months. The patient was advised to call immediately if she has any other concerning symptoms in the interval. All questions were answered. The patient knows to call the clinic with any problems, questions or concerns. We can certainly see the patient much sooner if necessary.  Disclaimer: This note was dictated with voice recognition software. Similar sounding words can inadvertently be transcribed and may be missed upon review.

## 2020-02-24 ENCOUNTER — Telehealth: Payer: Self-pay | Admitting: Internal Medicine

## 2020-02-24 NOTE — Telephone Encounter (Signed)
Scheduled per los. Called and spoke with patient. Confirmed appt 

## 2020-02-25 DIAGNOSIS — M9903 Segmental and somatic dysfunction of lumbar region: Secondary | ICD-10-CM | POA: Diagnosis not present

## 2020-02-25 DIAGNOSIS — M9902 Segmental and somatic dysfunction of thoracic region: Secondary | ICD-10-CM | POA: Diagnosis not present

## 2020-02-25 DIAGNOSIS — M530 Cervicocranial syndrome: Secondary | ICD-10-CM | POA: Diagnosis not present

## 2020-02-25 DIAGNOSIS — M9901 Segmental and somatic dysfunction of cervical region: Secondary | ICD-10-CM | POA: Diagnosis not present

## 2020-03-24 DIAGNOSIS — M9901 Segmental and somatic dysfunction of cervical region: Secondary | ICD-10-CM | POA: Diagnosis not present

## 2020-03-24 DIAGNOSIS — M9903 Segmental and somatic dysfunction of lumbar region: Secondary | ICD-10-CM | POA: Diagnosis not present

## 2020-03-24 DIAGNOSIS — M530 Cervicocranial syndrome: Secondary | ICD-10-CM | POA: Diagnosis not present

## 2020-03-24 DIAGNOSIS — M9902 Segmental and somatic dysfunction of thoracic region: Secondary | ICD-10-CM | POA: Diagnosis not present

## 2020-04-28 DIAGNOSIS — M9902 Segmental and somatic dysfunction of thoracic region: Secondary | ICD-10-CM | POA: Diagnosis not present

## 2020-04-28 DIAGNOSIS — M9903 Segmental and somatic dysfunction of lumbar region: Secondary | ICD-10-CM | POA: Diagnosis not present

## 2020-04-28 DIAGNOSIS — M530 Cervicocranial syndrome: Secondary | ICD-10-CM | POA: Diagnosis not present

## 2020-04-28 DIAGNOSIS — M9901 Segmental and somatic dysfunction of cervical region: Secondary | ICD-10-CM | POA: Diagnosis not present

## 2020-05-05 DIAGNOSIS — M9902 Segmental and somatic dysfunction of thoracic region: Secondary | ICD-10-CM | POA: Diagnosis not present

## 2020-05-05 DIAGNOSIS — M9903 Segmental and somatic dysfunction of lumbar region: Secondary | ICD-10-CM | POA: Diagnosis not present

## 2020-05-05 DIAGNOSIS — M9901 Segmental and somatic dysfunction of cervical region: Secondary | ICD-10-CM | POA: Diagnosis not present

## 2020-05-05 DIAGNOSIS — M530 Cervicocranial syndrome: Secondary | ICD-10-CM | POA: Diagnosis not present

## 2020-05-12 DIAGNOSIS — M530 Cervicocranial syndrome: Secondary | ICD-10-CM | POA: Diagnosis not present

## 2020-05-12 DIAGNOSIS — M9902 Segmental and somatic dysfunction of thoracic region: Secondary | ICD-10-CM | POA: Diagnosis not present

## 2020-05-12 DIAGNOSIS — M9903 Segmental and somatic dysfunction of lumbar region: Secondary | ICD-10-CM | POA: Diagnosis not present

## 2020-05-12 DIAGNOSIS — M9901 Segmental and somatic dysfunction of cervical region: Secondary | ICD-10-CM | POA: Diagnosis not present

## 2020-05-26 DIAGNOSIS — M530 Cervicocranial syndrome: Secondary | ICD-10-CM | POA: Diagnosis not present

## 2020-05-26 DIAGNOSIS — M9902 Segmental and somatic dysfunction of thoracic region: Secondary | ICD-10-CM | POA: Diagnosis not present

## 2020-05-26 DIAGNOSIS — M9903 Segmental and somatic dysfunction of lumbar region: Secondary | ICD-10-CM | POA: Diagnosis not present

## 2020-05-26 DIAGNOSIS — M9901 Segmental and somatic dysfunction of cervical region: Secondary | ICD-10-CM | POA: Diagnosis not present

## 2020-06-15 ENCOUNTER — Telehealth: Payer: Self-pay | Admitting: Medical Oncology

## 2020-06-15 NOTE — Telephone Encounter (Signed)
appts confirmed.  

## 2020-06-17 DIAGNOSIS — M530 Cervicocranial syndrome: Secondary | ICD-10-CM | POA: Diagnosis not present

## 2020-06-17 DIAGNOSIS — M9901 Segmental and somatic dysfunction of cervical region: Secondary | ICD-10-CM | POA: Diagnosis not present

## 2020-06-17 DIAGNOSIS — M9902 Segmental and somatic dysfunction of thoracic region: Secondary | ICD-10-CM | POA: Diagnosis not present

## 2020-06-17 DIAGNOSIS — M9903 Segmental and somatic dysfunction of lumbar region: Secondary | ICD-10-CM | POA: Diagnosis not present

## 2020-08-17 ENCOUNTER — Other Ambulatory Visit: Payer: Self-pay

## 2020-08-17 ENCOUNTER — Inpatient Hospital Stay: Payer: BC Managed Care – PPO | Attending: Internal Medicine

## 2020-08-17 DIAGNOSIS — Z87440 Personal history of urinary (tract) infections: Secondary | ICD-10-CM | POA: Diagnosis not present

## 2020-08-17 DIAGNOSIS — K580 Irritable bowel syndrome with diarrhea: Secondary | ICD-10-CM | POA: Insufficient documentation

## 2020-08-17 DIAGNOSIS — Z79899 Other long term (current) drug therapy: Secondary | ICD-10-CM | POA: Diagnosis not present

## 2020-08-17 LAB — CBC WITH DIFFERENTIAL (CANCER CENTER ONLY)
Abs Immature Granulocytes: 0.01 K/uL (ref 0.00–0.07)
Basophils Absolute: 0 K/uL (ref 0.0–0.1)
Basophils Relative: 0 %
Eosinophils Absolute: 0.1 K/uL (ref 0.0–0.5)
Eosinophils Relative: 2 %
HCT: 38.2 % (ref 36.0–46.0)
Hemoglobin: 12.8 g/dL (ref 12.0–15.0)
Immature Granulocytes: 0 %
Lymphocytes Relative: 40 %
Lymphs Abs: 2 K/uL (ref 0.7–4.0)
MCH: 32.2 pg (ref 26.0–34.0)
MCHC: 33.5 g/dL (ref 30.0–36.0)
MCV: 96 fL (ref 80.0–100.0)
Monocytes Absolute: 0.5 K/uL (ref 0.1–1.0)
Monocytes Relative: 10 %
Neutro Abs: 2.4 K/uL (ref 1.7–7.7)
Neutrophils Relative %: 48 %
Platelet Count: 162 K/uL (ref 150–400)
RBC: 3.98 MIL/uL (ref 3.87–5.11)
RDW: 12.3 % (ref 11.5–15.5)
WBC Count: 5 K/uL (ref 4.0–10.5)
nRBC: 0 % (ref 0.0–0.2)

## 2020-08-17 LAB — CMP (CANCER CENTER ONLY)
ALT: 19 U/L (ref 0–44)
AST: 18 U/L (ref 15–41)
Albumin: 3.9 g/dL (ref 3.5–5.0)
Alkaline Phosphatase: 57 U/L (ref 38–126)
Anion gap: 4 — ABNORMAL LOW (ref 5–15)
BUN: 15 mg/dL (ref 6–20)
CO2: 28 mmol/L (ref 22–32)
Calcium: 9.3 mg/dL (ref 8.9–10.3)
Chloride: 106 mmol/L (ref 98–111)
Creatinine: 0.73 mg/dL (ref 0.44–1.00)
GFR, Est AFR Am: >60 mL/min
GFR, Estimated: >60 mL/min
Glucose, Bld: 103 mg/dL — ABNORMAL HIGH (ref 70–99)
Potassium: 4.2 mmol/L (ref 3.5–5.1)
Sodium: 138 mmol/L (ref 135–145)
Total Bilirubin: 0.3 mg/dL (ref 0.3–1.2)
Total Protein: 7.1 g/dL (ref 6.5–8.1)

## 2020-08-18 ENCOUNTER — Telehealth: Payer: Self-pay | Admitting: Internal Medicine

## 2020-08-18 LAB — FERRITIN: Ferritin: 47 ng/mL (ref 11–307)

## 2020-08-18 LAB — IRON AND TIBC
Iron: 117 ug/dL (ref 41–142)
Saturation Ratios: 43 % (ref 21–57)
TIBC: 270 ug/dL (ref 236–444)
UIBC: 153 ug/dL (ref 120–384)

## 2020-08-18 NOTE — Telephone Encounter (Signed)
R/s 9/21 appt per MD on call. Called and spoke with patient. Confirmed new appt

## 2020-08-22 NOTE — Progress Notes (Signed)
[facility] OFFICE PROGRESS NOTE  [name], [place] [address] [place] [address]  DIAGNOSIS: Hereditary hemochromatosis with homozygous C282Y mutation.  PRIOR THERAPY: Weekly phlebotomies x3, last one was in January of 2014.  CURRENT THERAPY: Phlebotomy on as-needed basis.  INTERVAL HISTORY: Ruth Harrison 54 y.o. female returns to the clinic today for a 3 month follow up visit. The patient is feeling well today without any concerning complaints except for feeling off balance for the last 8 months or so. She has a routine physical scheduled with her PCP next month. She reports baseline fatigue but states it is no more than normal. She denies jaundice. Denies chest pain, shortness of breath, or cough. Denies nausea or vomiting. She has IBS and reports baseline bowel habits, which is primarily diarrhea. She started eating a little bit more red meat recently but knows to try to limit her iron rich food consumption. She is here for evaluation and a repeat CBC, CMP, ferritin, and iron studies.    MEDICAL HISTORY: Past Medical History:  Diagnosis Date  . Allergy    SEASONAL  . Chronic neck pain   . Colon polyps 2010  . Endometriosis   . Headache   . Hemochromatosis, hereditary (HCC)   . IBS (irritable bowel syndrome)   . Urinary tract infection     ALLERGIES:  is allergic to oxycodone, penicillins, prednisone, valacyclovir hcl, sulfa antibiotics, and doxycycline.  MEDICATIONS:  Current Outpatient Medications  Medication Sig Dispense Refill  . Ascorbic Acid (VITAMIN C) 1000 MG tablet Take 1,000 mg by mouth 2 (two) times daily.    . cetirizine (ZYRTEC) 10 MG tablet Take 10 mg by mouth daily.    Marland Kitchen DIGESTIVE ENZYMES PO Take 500 mg by mouth daily as needed (digestion).     . Magnesium 250 MG TABS Take by mouth.    . montelukast (SINGULAIR) 10 MG tablet Take 10 mg by mouth daily.    . Zinc 50 MG TABS Take 50 mg by mouth daily.     Marland Kitchen dicyclomine  (BENTYL) 20 MG tablet Take 1 tablet (20 mg total) by mouth 4 (four) times daily -  before meals and at bedtime. (Patient not taking: Reported on 11/21/2019) 90 tablet 1   No current facility-administered medications for this visit.    SURGICAL HISTORY:  Past Surgical History:  Procedure Laterality Date  . ABDOMINAL HYSTERECTOMY  partial   . LAPAROSCOPIC ENDOMETRIOSIS FULGURATION    . SHOULDER SURGERY  2015   right     REVIEW OF SYSTEMS:   Review of Systems  Constitutional: Positive for baseline fatigue. Negative for appetite change, chills, fever and unexpected weight change.  HENT: Negative for mouth sores, nosebleeds, sore throat and trouble swallowing.   Eyes: Negative for eye problems and icterus.  Respiratory: Negative for cough, hemoptysis, shortness of breath and wheezing.   Cardiovascular: Negative for chest pain and leg swelling.  Gastrointestinal: Negative for unusual abdominal pain, constipation, diarrhea , nausea and vomiting.  Genitourinary: Negative for bladder incontinence, difficulty urinating, dysuria, frequency and hematuria.   Musculoskeletal: Negative for back pain, gait problem, neck pain and neck stiffness.  Skin: Negative for itching and rash.  Neurological: Positive for feeling off balance. Negative for dizziness, extremity weakness, gait problem, headaches, light-headedness and seizures.  Hematological: Negative for adenopathy. Does not bruise/bleed easily.  Psychiatric/Behavioral: Negative for confusion, depression and sleep disturbance. The patient is not nervous/anxious.     PHYSICAL EXAMINATION:  Blood pressure Marland Kitchen)  98/59, pulse 70, temperature 97.8 F (36.6 C), temperature source Tympanic, resp. rate 17, height 5\' 1"  (1.549 m), weight 122 lb 11.2 oz (55.7 kg), SpO2 100 %.  ECOG PERFORMANCE STATUS: 0 - Asymptomatic  Physical Exam  Constitutional: Oriented to person, place, and time and well-developed, well-nourished, and in no distress.  HENT:  Head:  Normocephalic and atraumatic.  Mouth/Throat: Oropharynx is clear and moist. No oropharyngeal exudate.  Eyes: Conjunctivae are normal. Right eye exhibits no discharge. Left eye exhibits no discharge. No scleral icterus.  Neck: Normal range of motion. Neck supple.  Cardiovascular: Normal rate, regular rhythm, normal heart sounds and intact distal pulses.   Pulmonary/Chest: Effort normal and breath sounds normal. No respiratory distress. No wheezes. No rales.  Abdominal: Soft. Bowel sounds are normal. Exhibits no distension and no mass. Mild tenderness in RUQ.  Musculoskeletal: Normal range of motion. Exhibits no edema.  Lymphadenopathy:    No cervical adenopathy.  Neurological: Alert and oriented to person, place, and time. Exhibits normal muscle tone. Gait normal. Coordination normal.  Skin: Skin is warm and dry. No rash noted. Not diaphoretic. No erythema. No pallor.  Psychiatric: Mood, memory and judgment normal.  Vitals reviewed.  LABORATORY DATA: Lab Results  Component Value Date   WBC 5.0 08/17/2020   HGB 12.8 08/17/2020   HCT 38.2 08/17/2020   MCV 96.0 08/17/2020   PLT 162 08/17/2020      Chemistry      Component Value Date/Time   NA 138 08/17/2020 1556   NA 143 06/03/2018 1639   NA 140 07/31/2017 1436   K 4.2 08/17/2020 1556   K 4.2 07/31/2017 1436   CL 106 08/17/2020 1556   CL 103 08/15/2012 1600   CO2 28 08/17/2020 1556   CO2 29 07/31/2017 1436   BUN 15 08/17/2020 1556   BUN 11 06/03/2018 1639   BUN 9.7 07/31/2017 1436   CREATININE 0.73 08/17/2020 1556   CREATININE 0.8 07/31/2017 1436      Component Value Date/Time   CALCIUM 9.3 08/17/2020 1556   CALCIUM 9.5 07/31/2017 1436   ALKPHOS 57 08/17/2020 1556   ALKPHOS 45 07/31/2017 1436   AST 18 08/17/2020 1556   AST 20 07/31/2017 1436   ALT 19 08/17/2020 1556   ALT 24 07/31/2017 1436   BILITOT 0.3 08/17/2020 1556   BILITOT 0.60 07/31/2017 1436       RADIOGRAPHIC STUDIES:  No results  found.   ASSESSMENT/PLAN:  This is a very pleasant 54 year old Caucasian female with hereditary hemochromatosis with homozygous C282Y mutation.  She is currently on observation with phlebotomy on as-needed basis. Repeat CBC, comprehensive metabolic panel and iron study were unremarkable. Ferritin level is 47 today. The patient was seen with Dr. 40. We recommended for the patient to continue on observation with repeat CBC, iron study, ferritin as well as comprehensive metabolic panel in 9 months. She will not have a phlebotomy today.   She knows to avoid a iron rich diet and to avoid iron supplements.   The patient was advised to call immediately if she has any concerning symptoms in the interval. The patient voices understanding of current disease status and treatment options and is in agreement with the current care plan. All questions were answered. The patient knows to call the clinic with any problems, questions or concerns. We can certainly see the patient much sooner if necessary  Orders Placed This Encounter  Procedures  . Ferritin    Standing Status:  Future    Standing Expiration Date:   08/24/2021  . CMP (Cancer Center only)    Standing Status:   Future    Standing Expiration Date:   08/24/2021  . Iron and TIBC    Standing Status:   Future    Standing Expiration Date:   08/24/2021  . CBC with Differential (Cancer Center Only)    Standing Status:   Future    Standing Expiration Date:   08/24/2021     Johnette Abraham Faron Whitelock, PA-C 08/24/20  ADDENDUM: Hematology/Oncology Attending: I had a face-to-face encounter with the patient today.  I recommended her care plan.  This is a very pleasant 54 years old white female with history of hemochromatosis.  The patient has been undergoing phlebotomy on as-needed basis to keep her serum ferritin less than 50. She was last seen 6 months ago and has been doing fine with no concerning complaints. Repeat CBC, iron study and  ferritin recently showed ferritin level of 47 with no other concerning abnormalities. I recommended for the patient to continue on observation with follow-up visit in 9 months but she was advised to call sooner if she has any concerning symptoms.  Disclaimer: This note was dictated with voice recognition software. Similar sounding words can inadvertently be transcribed and may be missed upon review. Lajuana Matte, MD 08/24/20

## 2020-08-24 ENCOUNTER — Inpatient Hospital Stay: Payer: BC Managed Care – PPO | Admitting: Physician Assistant

## 2020-08-24 ENCOUNTER — Telehealth: Payer: Self-pay | Admitting: Physician Assistant

## 2020-08-24 ENCOUNTER — Inpatient Hospital Stay: Payer: BC Managed Care – PPO

## 2020-08-24 ENCOUNTER — Encounter: Payer: Self-pay | Admitting: Physician Assistant

## 2020-08-24 ENCOUNTER — Other Ambulatory Visit: Payer: Self-pay

## 2020-08-24 ENCOUNTER — Ambulatory Visit: Payer: BC Managed Care – PPO | Admitting: Internal Medicine

## 2020-08-24 NOTE — Telephone Encounter (Signed)
Scheduled appointments per 9/21 los. Spoke with patient who is aware of upcoming appointments. Gave patient updated calendar.

## 2021-05-23 ENCOUNTER — Inpatient Hospital Stay: Payer: BC Managed Care – PPO | Attending: Physician Assistant

## 2021-05-23 ENCOUNTER — Other Ambulatory Visit: Payer: Self-pay

## 2021-05-23 DIAGNOSIS — Z86010 Personal history of colonic polyps: Secondary | ICD-10-CM | POA: Insufficient documentation

## 2021-05-23 DIAGNOSIS — Z634 Disappearance and death of family member: Secondary | ICD-10-CM | POA: Insufficient documentation

## 2021-05-23 DIAGNOSIS — N390 Urinary tract infection, site not specified: Secondary | ICD-10-CM | POA: Insufficient documentation

## 2021-05-23 DIAGNOSIS — R5383 Other fatigue: Secondary | ICD-10-CM | POA: Insufficient documentation

## 2021-05-23 DIAGNOSIS — K580 Irritable bowel syndrome with diarrhea: Secondary | ICD-10-CM | POA: Insufficient documentation

## 2021-05-23 LAB — CMP (CANCER CENTER ONLY)
ALT: 24 U/L (ref 0–44)
AST: 20 U/L (ref 15–41)
Albumin: 4.4 g/dL (ref 3.5–5.0)
Alkaline Phosphatase: 50 U/L (ref 38–126)
Anion gap: 7 (ref 5–15)
BUN: 13 mg/dL (ref 6–20)
CO2: 27 mmol/L (ref 22–32)
Calcium: 9.3 mg/dL (ref 8.9–10.3)
Chloride: 105 mmol/L (ref 98–111)
Creatinine: 0.49 mg/dL (ref 0.44–1.00)
GFR, Estimated: >60 mL/min (ref >60)
Glucose, Bld: 86 mg/dL (ref 70–99)
Potassium: 4.3 mmol/L (ref 3.5–5.1)
Sodium: 139 mmol/L (ref 135–145)
Total Bilirubin: 0.5 mg/dL (ref 0.3–1.2)
Total Protein: 7.9 g/dL (ref 6.5–8.1)

## 2021-05-23 LAB — CBC WITH DIFFERENTIAL (CANCER CENTER ONLY)
Abs Immature Granulocytes: 0 10*3/uL (ref 0.00–0.07)
Basophils Absolute: 0 10*3/uL (ref 0.0–0.1)
Basophils Relative: 0 %
Eosinophils Absolute: 0 10*3/uL (ref 0.0–0.5)
Eosinophils Relative: 1 %
HCT: 40.2 % (ref 36.0–46.0)
Hemoglobin: 13.5 g/dL (ref 12.0–15.0)
Immature Granulocytes: 0 %
Lymphocytes Relative: 32 %
Lymphs Abs: 1.6 10*3/uL (ref 0.7–4.0)
MCH: 32.8 pg (ref 26.0–34.0)
MCHC: 33.6 g/dL (ref 30.0–36.0)
MCV: 97.6 fL (ref 80.0–100.0)
Monocytes Absolute: 0.4 10*3/uL (ref 0.1–1.0)
Monocytes Relative: 8 %
Neutro Abs: 2.9 10*3/uL (ref 1.7–7.7)
Neutrophils Relative %: 59 %
Platelet Count: 175 10*3/uL (ref 150–400)
RBC: 4.12 MIL/uL (ref 3.87–5.11)
RDW: 12.5 % (ref 11.5–15.5)
WBC Count: 4.9 10*3/uL (ref 4.0–10.5)
nRBC: 0 % (ref 0.0–0.2)

## 2021-05-23 LAB — IRON AND TIBC
Iron: 164 ug/dL (ref 28–170)
Saturation Ratios: 51 % — ABNORMAL HIGH (ref 10.4–31.8)
TIBC: 321 ug/dL (ref 250–450)
UIBC: 157 ug/dL

## 2021-05-23 LAB — FERRITIN: Ferritin: 51 ng/mL (ref 11–307)

## 2021-05-24 NOTE — Progress Notes (Signed)
[facility] OFFICE PROGRESS NOTE  [name], [place] [address] [place] [address]  DIAGNOSIS: Hereditary hemochromatosis with homozygous C282Y mutation.  PRIOR THERAPY: Weekly phlebotomies x3, last one was in January of 2014.  CURRENT THERAPY: Phlebotomy on as-needed basis  INTERVAL HISTORY: Ruth Harrison 55 y.o. female returns to the clinic today for a 9 month follow up visit. The patient is feeling fair today without any concerning complaints except for she recently lost her husband and is experiencing grief. She has information for a grief counselor that she is thinking about seeing.  She reports baseline fatigue but states it is no more than normal. She denies jaundice. Denies chest pain, shortness of breath, or cough. Denies nausea or vomiting. She has IBS and reports baseline bowel habits, which is primarily diarrhea. She started eating a little bit more red meat recently but knows to try to limit her iron rich food consumption. She is here for evaluation and a repeat CBC, CMP, ferritin, and iron studies.    MEDICAL HISTORY: Past Medical History:  Diagnosis Date   Allergy    SEASONAL   Chronic neck pain    Colon polyps 2010   Endometriosis    Headache    Hemochromatosis, hereditary (HCC)    IBS (irritable bowel syndrome)    Urinary tract infection     ALLERGIES:  is allergic to oxycodone, penicillins, prednisone, valacyclovir hcl, sulfa antibiotics, and doxycycline.  MEDICATIONS:  Current Outpatient Medications  Medication Sig Dispense Refill   Ascorbic Acid (VITAMIN C) 1000 MG tablet Take 1,000 mg by mouth 2 (two) times daily.     cetirizine (ZYRTEC) 10 MG tablet Take 10 mg by mouth daily.     dicyclomine (BENTYL) 20 MG tablet Take 1 tablet (20 mg total) by mouth 4 (four) times daily -  before meals and at bedtime. (Patient not taking: Reported on 11/21/2019) 90 tablet 1   DIGESTIVE ENZYMES PO Take 500 mg by mouth daily as needed  (digestion).      Magnesium 250 MG TABS Take by mouth.     montelukast (SINGULAIR) 10 MG tablet Take 10 mg by mouth daily.     Zinc 50 MG TABS Take 50 mg by mouth daily.      No current facility-administered medications for this visit.    SURGICAL HISTORY:  Past Surgical History:  Procedure Laterality Date   ABDOMINAL HYSTERECTOMY  partial    LAPAROSCOPIC ENDOMETRIOSIS FULGURATION     SHOULDER SURGERY  2015   right     REVIEW OF SYSTEMS:   Review of Systems  Constitutional: Positive for baseline fatigue. Negative for appetite change, chills, fever and unexpected weight change. HENT: Negative for mouth sores, nosebleeds, sore throat and trouble swallowing.   Eyes: Negative for eye problems and icterus. Respiratory: Negative for cough, hemoptysis, shortness of breath and wheezing.   Cardiovascular: Negative for chest pain and leg swelling. Gastrointestinal: Negative for unusual abdominal pain, constipation, diarrhea , nausea and vomiting. Genitourinary: Negative for bladder incontinence, difficulty urinating, dysuria, frequency and hematuria.   Musculoskeletal: Negative for back pain, gait problem, neck pain and neck stiffness. Skin: Negative for itching and rash. Neurological: Positive for feeling off balance. Negative for dizziness, extremity weakness, gait problem, headaches, light-headedness and seizures. Hematological: Negative for adenopathy. Does not bruise/bleed easily. Psychiatric/Behavioral: Negative for confusion, depression and sleep disturbance. The patient is not nervous/anxious.   PHYSICAL EXAMINATION:  Blood pressure 100/61, pulse 68, temperature (!) 96.8 F (36  C), temperature source Tympanic, resp. rate 17, height 5\' 1"  (1.549 m), weight 118 lb 4.8 oz (53.7 kg), SpO2 100 %.  ECOG PERFORMANCE STATUS: 0  Physical Exam  Constitutional: Oriented to person, place, and time and well-developed, well-nourished, and in no distress. HENT: Head: Normocephalic and  atraumatic. Mouth/Throat: Oropharynx is clear and moist. No oropharyngeal exudate. Eyes: Conjunctivae are normal. Right eye exhibits no discharge. Left eye exhibits no discharge. No scleral icterus. Neck: Normal range of motion. Neck supple. Cardiovascular: Normal rate, regular rhythm, normal heart sounds and intact distal pulses.   Pulmonary/Chest: Effort normal and breath sounds normal. No respiratory distress. No wheezes. No rales. Abdominal: Soft. Bowel sounds are normal. Exhibits no distension and no mass. No tenderness.  Musculoskeletal: Normal range of motion. Exhibits no edema.  Lymphadenopathy:    No cervical adenopathy.  Neurological: Alert and oriented to person, place, and time. Exhibits normal muscle tone. Gait normal. Coordination normal. Skin: Skin is warm and dry. No rash noted. Not diaphoretic. No erythema. No pallor.  Psychiatric: Mood, memory and judgment normal. Vitals reviewed.  LABORATORY DATA: Lab Results  Component Value Date   WBC 4.9 05/23/2021   HGB 13.5 05/23/2021   HCT 40.2 05/23/2021   MCV 97.6 05/23/2021   PLT 175 05/23/2021      Chemistry      Component Value Date/Time   NA 139 05/23/2021 1425   NA 143 06/03/2018 1639   NA 140 07/31/2017 1436   K 4.3 05/23/2021 1425   K 4.2 07/31/2017 1436   CL 105 05/23/2021 1425   CL 103 08/15/2012 1600   CO2 27 05/23/2021 1425   CO2 29 07/31/2017 1436   BUN 13 05/23/2021 1425   BUN 11 06/03/2018 1639   BUN 9.7 07/31/2017 1436   CREATININE 0.49 05/23/2021 1425   CREATININE 0.8 07/31/2017 1436      Component Value Date/Time   CALCIUM 9.3 05/23/2021 1425   CALCIUM 9.5 07/31/2017 1436   ALKPHOS 50 05/23/2021 1425   ALKPHOS 45 07/31/2017 1436   AST 20 05/23/2021 1425   AST 20 07/31/2017 1436   ALT 24 05/23/2021 1425   ALT 24 07/31/2017 1436   BILITOT 0.5 05/23/2021 1425   BILITOT 0.60 07/31/2017 1436       RADIOGRAPHIC STUDIES:  No results found.   ASSESSMENT/PLAN:  This is a very pleasant  55 year old Caucasian female with hereditary hemochromatosis with homozygous C282Y mutation. She is currently on observation with phlebotomy on as-needed basis. Repeat CBC and comprehensive metabolic panel were unremarkable. Ferritin level is 51 today. Her iron studies were within normal limits except her saturation was elevated. The patient was seen with Dr. 57. She is borderline for phlebotomy today. Dr. Arbutus Ped gave her the option of phlebotomy today with a follow up lab in 9 months vs continuing on observation with a repeat CBC, CMP, Iron studies, and Ferritin in 6 months.   She opted to continue on observation with repeat CBC, CMP, iron studies, and ferritin in 6 months.   She knows to avoid a iron rich diet and to avoid iron supplements.   The patient was advised to call immediately if she has any concerning symptoms in the interval. The patient voices understanding of current disease status and treatment options and is in agreement with the current care plan. All questions were answered. The patient knows to call the clinic with any problems, questions or concerns. We can certainly see the patient much sooner if necessary  Orders Placed This Encounter  Procedures   Ferritin    Standing Status:   Future    Standing Expiration Date:   05/26/2022   Iron and TIBC    Standing Status:   Future    Standing Expiration Date:   05/26/2022   CBC with Differential (Cancer Center Only)    Standing Status:   Future    Standing Expiration Date:   05/26/2022   CMP (Cancer Center only)    Standing Status:   Future    Standing Expiration Date:   05/26/2022      Johnette Abraham Kamira Mellette, PA-C 05/26/21  ADDENDUM: Hematology/Oncology Attending: I had a face-to-face encounter with the patient today.  I reviewed her records, lab and recommended her care plan.  This is a very pleasant 55 years old white female with history of hereditary hemochromatosis with homozygous C282Y mutations.   She has been treated with phlebotomy on as-needed basis to keep her ferritin level less than 50. Unfortunately the patient lost her husband recently after he was diagnosed with metastatic bladder cancer. Her ferritin level today is 51.  I did The patient's option of proceeding with phlebotomy and follow-up visit in 9 months versus coming back for follow-up visit in 6 months with repeat CBC, iron study and ferritin and if needed at that time we will consider her for phlebotomy. The patient would like to wait and have the repeat blood work in 6 months. She was advised to call immediately if she has any other concerning symptoms in the interval.  Disclaimer: This note was dictated with voice recognition software. Similar sounding words can inadvertently be transcribed and may be missed upon review. Lajuana Matte, MD 05/26/21

## 2021-05-26 ENCOUNTER — Other Ambulatory Visit: Payer: Self-pay

## 2021-05-26 ENCOUNTER — Inpatient Hospital Stay: Payer: BC Managed Care – PPO | Admitting: Physician Assistant

## 2021-05-26 ENCOUNTER — Inpatient Hospital Stay: Payer: BC Managed Care – PPO

## 2021-09-29 ENCOUNTER — Other Ambulatory Visit: Payer: Self-pay | Admitting: Unknown Physician Specialty

## 2021-09-29 DIAGNOSIS — Z1231 Encounter for screening mammogram for malignant neoplasm of breast: Secondary | ICD-10-CM

## 2021-11-18 ENCOUNTER — Inpatient Hospital Stay: Payer: BC Managed Care – PPO

## 2021-11-23 ENCOUNTER — Inpatient Hospital Stay: Payer: BC Managed Care – PPO

## 2021-11-23 ENCOUNTER — Inpatient Hospital Stay: Payer: BC Managed Care – PPO | Admitting: Internal Medicine

## 2021-12-01 ENCOUNTER — Inpatient Hospital Stay: Payer: BC Managed Care – PPO | Attending: Internal Medicine

## 2021-12-01 ENCOUNTER — Other Ambulatory Visit: Payer: Self-pay

## 2021-12-01 ENCOUNTER — Inpatient Hospital Stay: Payer: BC Managed Care – PPO

## 2021-12-01 LAB — CMP (CANCER CENTER ONLY)
ALT: 25 U/L (ref 0–44)
AST: 20 U/L (ref 15–41)
Albumin: 4.4 g/dL (ref 3.5–5.0)
Alkaline Phosphatase: 51 U/L (ref 38–126)
Anion gap: 6 (ref 5–15)
BUN: 13 mg/dL (ref 6–20)
CO2: 30 mmol/L (ref 22–32)
Calcium: 9.6 mg/dL (ref 8.9–10.3)
Chloride: 105 mmol/L (ref 98–111)
Creatinine: 0.51 mg/dL (ref 0.44–1.00)
GFR, Estimated: >60 mL/min (ref >60)
Glucose, Bld: 77 mg/dL (ref 70–99)
Potassium: 4 mmol/L (ref 3.5–5.1)
Sodium: 141 mmol/L (ref 135–145)
Total Bilirubin: 0.7 mg/dL (ref 0.3–1.2)
Total Protein: 7.7 g/dL (ref 6.5–8.1)

## 2021-12-01 LAB — CBC WITH DIFFERENTIAL (CANCER CENTER ONLY)
Abs Immature Granulocytes: 0 10*3/uL (ref 0.00–0.07)
Basophils Absolute: 0 10*3/uL (ref 0.0–0.1)
Basophils Relative: 0 %
Eosinophils Absolute: 0 10*3/uL (ref 0.0–0.5)
Eosinophils Relative: 1 %
HCT: 39.6 % (ref 36.0–46.0)
Hemoglobin: 13.3 g/dL (ref 12.0–15.0)
Immature Granulocytes: 0 %
Lymphocytes Relative: 36 %
Lymphs Abs: 1.4 10*3/uL (ref 0.7–4.0)
MCH: 32.1 pg (ref 26.0–34.0)
MCHC: 33.6 g/dL (ref 30.0–36.0)
MCV: 95.7 fL (ref 80.0–100.0)
Monocytes Absolute: 0.3 10*3/uL (ref 0.1–1.0)
Monocytes Relative: 8 %
Neutro Abs: 2.1 10*3/uL (ref 1.7–7.7)
Neutrophils Relative %: 55 %
Platelet Count: 167 10*3/uL (ref 150–400)
RBC: 4.14 MIL/uL (ref 3.87–5.11)
RDW: 12.4 % (ref 11.5–15.5)
WBC Count: 3.9 10*3/uL — ABNORMAL LOW (ref 4.0–10.5)
nRBC: 0 % (ref 0.0–0.2)

## 2021-12-01 LAB — IRON AND IRON BINDING CAPACITY (CC-WL,HP ONLY)
Iron: 230 ug/dL — ABNORMAL HIGH (ref 28–170)
Saturation Ratios: 74 % — ABNORMAL HIGH (ref 10.4–31.8)
TIBC: 311 ug/dL (ref 250–450)
UIBC: 81 ug/dL — ABNORMAL LOW (ref 148–442)

## 2021-12-01 LAB — FERRITIN: Ferritin: 72 ng/mL (ref 11–307)

## 2021-12-07 ENCOUNTER — Inpatient Hospital Stay: Payer: BC Managed Care – PPO | Attending: Internal Medicine | Admitting: Internal Medicine

## 2021-12-07 ENCOUNTER — Inpatient Hospital Stay: Payer: BC Managed Care – PPO

## 2021-12-07 ENCOUNTER — Other Ambulatory Visit: Payer: Self-pay

## 2021-12-07 DIAGNOSIS — R5383 Other fatigue: Secondary | ICD-10-CM | POA: Insufficient documentation

## 2021-12-07 DIAGNOSIS — M2550 Pain in unspecified joint: Secondary | ICD-10-CM | POA: Diagnosis not present

## 2021-12-07 DIAGNOSIS — K589 Irritable bowel syndrome without diarrhea: Secondary | ICD-10-CM | POA: Insufficient documentation

## 2021-12-07 DIAGNOSIS — Z86010 Personal history of colonic polyps: Secondary | ICD-10-CM | POA: Insufficient documentation

## 2021-12-07 DIAGNOSIS — Z79899 Other long term (current) drug therapy: Secondary | ICD-10-CM | POA: Insufficient documentation

## 2021-12-07 NOTE — Progress Notes (Signed)
Ruth Harrison presents today for phlebotomy per MD orders. Phlebotomy procedure started at 1202 and ended at 1223. 500 grams removed. Patient observed for 30 minutes after procedure without any incident. Patient tolerated procedure well. IV needle removed intact.

## 2021-12-07 NOTE — Progress Notes (Signed)
[facility] [phone]   [phone]  OFFICE PROGRESS NOTE  [name], [place] [address] [place] [address]  DIAGNOSIS: Hereditary hemochromatosis with homozygous C282Y mutation.   PRIOR THERAPY: Weekly phlebotomies x3, last one was in January of 2014.   CURRENT THERAPY: Phlebotomy on as-needed basis.  INTERVAL HISTORY: Ruth Harrison 56 y.o. female returns to the clinic today for 60-month follow-up visit.  The patient is feeling fine with no complaints today except for mild fatigue and arthralgia.  She denied having any chest pain, shortness of breath, cough or hemoptysis.  She denied having any fever or chills.  She has no nausea, vomiting, diarrhea or constipation.  She denied having any headache or visual changes.  She has no weight loss or night sweats.  She is planning to move to [place] to be closer to her family within the next 6 months.  The patient had repeat CBC, iron study and ferritin performed recently and she is here for evaluation and recommendation regarding her condition.  MEDICAL HISTORY: Past Medical History:  Diagnosis Date   Allergy    SEASONAL   Chronic neck pain    Colon polyps 2010   Endometriosis    Headache    Hemochromatosis, hereditary (HCC)    IBS (irritable bowel syndrome)    Urinary tract infection     ALLERGIES:  is allergic to oxycodone, penicillins, prednisone, valacyclovir hcl, sulfa antibiotics, and doxycycline.  MEDICATIONS:  Current Outpatient Medications  Medication Sig Dispense Refill   Ascorbic Acid (VITAMIN C) 1000 MG tablet Take 1,000 mg by mouth 2 (two) times daily.     cetirizine (ZYRTEC) 10 MG tablet Take 10 mg by mouth daily.     dicyclomine (BENTYL) 20 MG tablet Take 1 tablet (20 mg total) by mouth 4 (four) times daily -  before meals and at bedtime. (Patient not taking: Reported on 11/21/2019) 90 tablet 1   DIGESTIVE ENZYMES PO Take 500 mg by mouth daily as needed  (digestion).      Magnesium 250 MG TABS Take by mouth.     montelukast (SINGULAIR) 10 MG tablet Take 10 mg by mouth daily.     Zinc 50 MG TABS Take 50 mg by mouth daily.      No current facility-administered medications for this visit.    SURGICAL HISTORY:  Past Surgical History:  Procedure Laterality Date   ABDOMINAL HYSTERECTOMY  partial    LAPAROSCOPIC ENDOMETRIOSIS FULGURATION     SHOULDER SURGERY  2015   right     REVIEW OF SYSTEMS:  A comprehensive review of systems was negative except for: Constitutional: positive for fatigue Musculoskeletal: positive for arthralgias   PHYSICAL EXAMINATION: General appearance: alert, cooperative, fatigued, and no distress Head: Normocephalic, without obvious abnormality, atraumatic Neck: no adenopathy Lymph nodes: Cervical, supraclavicular, and axillary nodes normal. Resp: clear to auscultation bilaterally Back: symmetric, no curvature. ROM normal. No CVA tenderness. Cardio: regular rate and rhythm, S1, S2 normal, no murmur, click, rub or gallop GI: soft, non-tender; bowel sounds normal; no masses,  no organomegaly Extremities: extremities normal, atraumatic, no cyanosis or edema  ECOG PERFORMANCE STATUS: 1 - Symptomatic but completely ambulatory  Blood pressure 102/62, pulse 69, temperature (!) 97.5 F (36.4 C), temperature source Tympanic, resp. rate 17, height 5\' 1"  (1.549 m), weight 121 lb 6.4 oz (55.1 kg), SpO2 99 %.  LABORATORY DATA: Lab Results  Component Value Date   WBC 3.9 (L) 12/01/2021  HGB 13.3 12/01/2021   HCT 39.6 12/01/2021   MCV 95.7 12/01/2021   PLT 167 12/01/2021      Chemistry      Component Value Date/Time   NA 141 12/01/2021 1052   NA 143 06/03/2018 1639   NA 140 07/31/2017 1436   K 4.0 12/01/2021 1052   K 4.2 07/31/2017 1436   CL 105 12/01/2021 1052   CL 103 08/15/2012 1600   CO2 30 12/01/2021 1052   CO2 29 07/31/2017 1436   BUN 13 12/01/2021 1052   BUN 11 06/03/2018 1639   BUN 9.7 07/31/2017  1436   CREATININE 0.51 12/01/2021 1052   CREATININE 0.8 07/31/2017 1436      Component Value Date/Time   CALCIUM 9.6 12/01/2021 1052   CALCIUM 9.5 07/31/2017 1436   ALKPHOS 51 12/01/2021 1052   ALKPHOS 45 07/31/2017 1436   AST 20 12/01/2021 1052   AST 20 07/31/2017 1436   ALT 25 12/01/2021 1052   ALT 24 07/31/2017 1436   BILITOT 0.7 12/01/2021 1052   BILITOT 0.60 07/31/2017 1436     Ferritin 72, serum iron 230, total iron binding capacity 311, iron saturation 74%.  RADIOGRAPHIC STUDIES: No results found.  ASSESSMENT AND PLAN:  This is a very pleasant 56 years old white female with hereditary hemochromatosis with homozygous C282Y mutation.  She is currently on observation with phlebotomy on as-needed basis. Repeat CBC, comprehensive metabolic panel and iron study were unremarkable. Ferritin level is 72 today. I discussed the lab results with the patient today.  I recommended for her to proceed with phlebotomy today as planned.  The patient mentions that she is moving to [place] to be closer to her family within the next 6 months.  She would like to be seen in May before her moving date. I will arrange for her to have repeat blood work as well as phlebotomy if needed in the middle of May 2023. She was advised to call immediately if she has any other concerning symptoms in the interval. All questions were answered. The patient knows to call the clinic with any problems, questions or concerns. We can certainly see the patient much sooner if necessary.  Disclaimer: This note was dictated with voice recognition software. Similar sounding words can inadvertently be transcribed and may be missed upon review.

## 2021-12-07 NOTE — Patient Instructions (Signed)

## 2021-12-19 ENCOUNTER — Telehealth: Payer: Self-pay | Admitting: Internal Medicine

## 2021-12-19 NOTE — Telephone Encounter (Signed)
Sch per 1/4 los, pt aware °

## 2022-03-17 ENCOUNTER — Encounter: Payer: Self-pay | Admitting: Gastroenterology

## 2022-04-17 ENCOUNTER — Other Ambulatory Visit: Payer: Self-pay

## 2022-04-17 ENCOUNTER — Inpatient Hospital Stay: Payer: BC Managed Care – PPO | Attending: Internal Medicine

## 2022-04-17 ENCOUNTER — Telehealth: Payer: Self-pay | Admitting: Internal Medicine

## 2022-04-17 DIAGNOSIS — Z90710 Acquired absence of both cervix and uterus: Secondary | ICD-10-CM | POA: Diagnosis not present

## 2022-04-17 LAB — IRON AND IRON BINDING CAPACITY (CC-WL,HP ONLY)
Iron: 267 ug/dL — ABNORMAL HIGH (ref 28–170)
Saturation Ratios: 88 % — ABNORMAL HIGH (ref 10.4–31.8)
TIBC: 305 ug/dL (ref 250–450)
UIBC: 38 ug/dL — ABNORMAL LOW (ref 148–442)

## 2022-04-17 LAB — CMP (CANCER CENTER ONLY)
ALT: 16 U/L (ref 0–44)
AST: 18 U/L (ref 15–41)
Albumin: 4.4 g/dL (ref 3.5–5.0)
Alkaline Phosphatase: 51 U/L (ref 38–126)
Anion gap: 5 (ref 5–15)
BUN: 8 mg/dL (ref 6–20)
CO2: 31 mmol/L (ref 22–32)
Calcium: 9.4 mg/dL (ref 8.9–10.3)
Chloride: 106 mmol/L (ref 98–111)
Creatinine: 0.58 mg/dL (ref 0.44–1.00)
GFR, Estimated: >60 mL/min (ref >60)
Glucose, Bld: 95 mg/dL (ref 70–99)
Potassium: 4.2 mmol/L (ref 3.5–5.1)
Sodium: 142 mmol/L (ref 135–145)
Total Bilirubin: 0.7 mg/dL (ref 0.3–1.2)
Total Protein: 7.6 g/dL (ref 6.5–8.1)

## 2022-04-17 LAB — CBC WITH DIFFERENTIAL (CANCER CENTER ONLY)
Abs Immature Granulocytes: 0 10*3/uL (ref 0.00–0.07)
Basophils Absolute: 0 10*3/uL (ref 0.0–0.1)
Basophils Relative: 0 %
Eosinophils Absolute: 0.1 10*3/uL (ref 0.0–0.5)
Eosinophils Relative: 1 %
HCT: 41.5 % (ref 36.0–46.0)
Hemoglobin: 14.1 g/dL (ref 12.0–15.0)
Immature Granulocytes: 0 %
Lymphocytes Relative: 42 %
Lymphs Abs: 1.7 10*3/uL (ref 0.7–4.0)
MCH: 32 pg (ref 26.0–34.0)
MCHC: 34 g/dL (ref 30.0–36.0)
MCV: 94.3 fL (ref 80.0–100.0)
Monocytes Absolute: 0.3 10*3/uL (ref 0.1–1.0)
Monocytes Relative: 8 %
Neutro Abs: 2 10*3/uL (ref 1.7–7.7)
Neutrophils Relative %: 49 %
Platelet Count: 174 10*3/uL (ref 150–400)
RBC: 4.4 MIL/uL (ref 3.87–5.11)
RDW: 12.4 % (ref 11.5–15.5)
WBC Count: 4.2 10*3/uL (ref 4.0–10.5)
nRBC: 0 % (ref 0.0–0.2)

## 2022-04-17 LAB — FERRITIN: Ferritin: 53 ng/mL (ref 11–307)

## 2022-04-17 NOTE — Telephone Encounter (Signed)
Rescheduled 05/17 appointment time per patient's request, approved by charge. ?

## 2022-04-19 ENCOUNTER — Inpatient Hospital Stay: Payer: BC Managed Care – PPO | Admitting: Internal Medicine

## 2022-04-19 ENCOUNTER — Other Ambulatory Visit: Payer: Self-pay

## 2022-04-19 ENCOUNTER — Inpatient Hospital Stay: Payer: BC Managed Care – PPO

## 2022-04-19 NOTE — Progress Notes (Signed)
?    [facility] ?Telephone:(336) [phone]   [phone] ? ?OFFICE PROGRESS NOTE ? ?[name], PA ?[address] ?[place] [address] ? 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  ? ?PRIOR THERAPY: Weekly phlebotomies x3, last one was in January of 2014.  ? ?CURRENT THERAPY: Phlebotomy on as-needed basis. ? ?INTERVAL HISTORY: ?Ruth Harrison 56 y.o. female returns to the clinic today for follow-up visit.  The patient is feeling fine today with no concerning complaints except for occasional nausea started after taking Zyrtec for seasonal allergy.  The patient denied having any current chest pain, shortness of breath, cough or hemoptysis.  She has no vomiting, abdominal pain, diarrhea or constipation.  She has no headache or visual changes.  She continues to have mild pain in the wrist bilaterally.  The patient is here today for evaluation with repeat CBC, iron study and ferritin. ? ?MEDICAL HISTORY: ?Past Medical History:  ?Diagnosis Date  ? Allergy   ? SEASONAL  ? Chronic neck pain   ? Colon polyps 2010  ? Endometriosis   ? Headache   ? Hemochromatosis, hereditary (HCC)   ? IBS (irritable bowel syndrome)   ? Urinary tract infection   ? ? ?ALLERGIES:  is allergic to oxycodone, penicillins, prednisone, valacyclovir hcl, sulfa antibiotics, voltaren [diclofenac], doxycycline, meloxicam, and wheat bran. ? ?MEDICATIONS:  ?Current Outpatient Medications  ?Medication Sig Dispense Refill  ? Ascorbic Acid (VITAMIN C) 100 MG tablet Take 250 mg by mouth daily.    ? cholecalciferol (VITAMIN D3) 25 MCG (1000 UNIT) tablet Take 2,000 Units by mouth daily.    ? DIGESTIVE ENZYMES PO Take 500 mg by mouth daily as needed (digestion).     ? montelukast (SINGULAIR) 10 MG tablet Take 10 mg by mouth daily.    ? zinc gluconate 50 MG tablet Take 50 mg by mouth daily.    ? cetirizine (ZYRTEC) 10 MG tablet Take 10 mg by mouth daily. (Patient not taking: Reported on  04/19/2022)    ? ?No current facility-administered medications for this visit.  ? ? ?SURGICAL HISTORY:  ?Past Surgical History:  ?Procedure Laterality Date  ? ABDOMINAL HYSTERECTOMY  partial   ? LAPAROSCOPIC ENDOMETRIOSIS FULGURATION    ? SHOULDER SURGERY  2015  ? right   ? ? ?REVIEW OF SYSTEMS:  A comprehensive review of systems was negative except for: Constitutional: positive for fatigue ?Gastrointestinal: positive for nausea ?Musculoskeletal: positive for arthralgias  ? ?PHYSICAL EXAMINATION: General appearance: alert, cooperative, fatigued, and no distress ?Head: Normocephalic, without obvious abnormality, atraumatic ?Neck: no adenopathy ?Lymph nodes: Cervical, supraclavicular, and axillary nodes normal. ?Resp: clear to auscultation bilaterally ?Back: symmetric, no curvature. ROM normal. No CVA tenderness. ?Cardio: regular rate and rhythm, S1, S2 normal, no murmur, click, rub or gallop ?GI: soft, non-tender; bowel sounds normal; no masses,  no organomegaly ?Extremities: extremities normal, atraumatic, no cyanosis or edema ? ?ECOG PERFORMANCE STATUS: 1 - Symptomatic but completely ambulatory ? ?Blood pressure 110/64, pulse 76, temperature (!) 97.2 ?F (36.2 ?C), temperature source Tympanic, resp. rate 18, weight 121 lb 9 oz (55.1 kg), SpO2 97 %. ? ?LABORATORY DATA: ?Lab Results  ?Component Value Date  ? WBC 4.2 04/17/2022  ? HGB 14.1 04/17/2022  ? HCT 41.5 04/17/2022  ? MCV 94.3 04/17/2022  ? PLT 174 04/17/2022  ? ? ?  Chemistry   ?   ?Component Value Date/Time  ? NA 142 04/17/2022 1105  ? NA 143 06/03/2018 1639  ?  NA 140 07/31/2017 1436  ? K 4.2 04/17/2022 1105  ? K 4.2 07/31/2017 1436  ? CL 106 04/17/2022 1105  ? CL 103 08/15/2012 1600  ? CO2 31 04/17/2022 1105  ? CO2 29 07/31/2017 1436  ? BUN 8 04/17/2022 1105  ? BUN 11 06/03/2018 1639  ? BUN 9.7 07/31/2017 1436  ? CREATININE 0.58 04/17/2022 1105  ? CREATININE 0.8 07/31/2017 1436  ?    ?Component Value Date/Time  ? CALCIUM 9.4 04/17/2022 1105  ? CALCIUM 9.5  07/31/2017 1436  ? ALKPHOS 51 04/17/2022 1105  ? ALKPHOS 45 07/31/2017 1436  ? AST 18 04/17/2022 1105  ? AST 20 07/31/2017 1436  ? ALT 16 04/17/2022 1105  ? ALT 24 07/31/2017 1436  ? BILITOT 0.7 04/17/2022 1105  ? BILITOT 0.60 07/31/2017 1436  ?  ? ?Ferritin 53, serum iron 267, total iron binding capacity 305, iron saturation 88%. ? ?RADIOGRAPHIC STUDIES: ?No results found. ? ?ASSESSMENT AND PLAN:  ?This is a very pleasant 56 years old white female with hereditary hemochromatosis with homozygous C282Y mutation.  ?The patient is currently on observation and phlebotomy on as-needed basis. ?Repeat CBC, iron study and ferritin today showed ferritin level of 53. ?I discussed the lab results with the patient and I do not see any urgent need for phlebotomy at this point. ?I will see her back for follow-up visit in 6 months for evaluation of the patient is still in [place] and did not move to [place]. ?She was advised to call immediately if she has any other concerning symptoms in the interval. ?All questions were answered. The patient knows to call the clinic with any problems, questions or concerns. We can certainly see the patient much sooner if necessary. ? ?Disclaimer: This note was dictated with voice recognition software. Similar sounding words can inadvertently be transcribed and may be missed upon review. ? ? ?  ?  ?

## 2022-12-12 ENCOUNTER — Other Ambulatory Visit: Payer: Self-pay

## 2022-12-12 ENCOUNTER — Inpatient Hospital Stay: Payer: BC Managed Care – PPO | Attending: Internal Medicine

## 2022-12-12 LAB — CBC WITH DIFFERENTIAL (CANCER CENTER ONLY)
Abs Immature Granulocytes: 0 10*3/uL (ref 0.00–0.07)
Basophils Absolute: 0 10*3/uL (ref 0.0–0.1)
Basophils Relative: 0 %
Eosinophils Absolute: 0.1 10*3/uL (ref 0.0–0.5)
Eosinophils Relative: 1 %
HCT: 38.6 % (ref 36.0–46.0)
Hemoglobin: 12.8 g/dL (ref 12.0–15.0)
Immature Granulocytes: 0 %
Lymphocytes Relative: 46 %
Lymphs Abs: 2 10*3/uL (ref 0.7–4.0)
MCH: 32.1 pg (ref 26.0–34.0)
MCHC: 33.2 g/dL (ref 30.0–36.0)
MCV: 96.7 fL (ref 80.0–100.0)
Monocytes Absolute: 0.4 10*3/uL (ref 0.1–1.0)
Monocytes Relative: 9 %
Neutro Abs: 1.9 10*3/uL (ref 1.7–7.7)
Neutrophils Relative %: 44 %
Platelet Count: 162 10*3/uL (ref 150–400)
RBC: 3.99 MIL/uL (ref 3.87–5.11)
RDW: 12.2 % (ref 11.5–15.5)
WBC Count: 4.4 10*3/uL (ref 4.0–10.5)
nRBC: 0 % (ref 0.0–0.2)

## 2022-12-12 LAB — IRON AND IRON BINDING CAPACITY (CC-WL,HP ONLY)
Iron: 152 ug/dL (ref 28–170)
Saturation Ratios: 49 % — ABNORMAL HIGH (ref 10.4–31.8)
TIBC: 311 ug/dL (ref 250–450)
UIBC: 159 ug/dL (ref 148–442)

## 2022-12-12 LAB — FERRITIN: Ferritin: 42 ng/mL (ref 11–307)

## 2022-12-14 ENCOUNTER — Inpatient Hospital Stay (HOSPITAL_BASED_OUTPATIENT_CLINIC_OR_DEPARTMENT_OTHER): Payer: BC Managed Care – PPO | Admitting: Internal Medicine

## 2022-12-14 NOTE — Progress Notes (Signed)
[place] Telephone:(336) [phone]   Fax:(336) [phone]  OFFICE PROGRESS NOTE  Harrison Mons, PA [address] [place] [address]  DIAGNOSIS: Hereditary hemochromatosis with homozygous C282Y mutation.   PRIOR THERAPY: Weekly phlebotomies x3, last one was in January of 2014.   CURRENT THERAPY: Phlebotomy on as-needed basis.  INTERVAL HISTORY: Ruth Harrison 57 y.o. female returns to the clinic today for follow-up visit.  The patient is feeling fine today with no concerning complaints.  She denied having any current chest pain, shortness of breath, cough or hemoptysis.  She has no nausea, vomiting, diarrhea or constipation.  She has no headache or visual changes.  She was planning to move to [place] but she is currently undergoing a lot of dental intervention and her move is currently on hold.  She had repeat CBC, iron study and ferritin performed recently and she is here for evaluation and discussion of her lab results.  MEDICAL HISTORY: Past Medical History:  Diagnosis Date   Allergy    SEASONAL   Chronic neck pain    Colon polyps 2010   Endometriosis    Headache    Hemochromatosis, hereditary ([place])    IBS (irritable bowel syndrome)    Urinary tract infection     ALLERGIES:  is allergic to oxycodone, penicillins, prednisone, valacyclovir hcl, sulfa antibiotics, voltaren [diclofenac], doxycycline, meloxicam, and wheat bran.  MEDICATIONS:  Current Outpatient Medications  Medication Sig Dispense Refill   Ascorbic Acid (VITAMIN C) 100 MG tablet Take 250 mg by mouth daily.     cholecalciferol (VITAMIN D3) 25 MCG (1000 UNIT) tablet Take 2,000 Units by mouth daily.     DIGESTIVE ENZYMES PO Take 500 mg by mouth daily as needed (digestion).      montelukast (SINGULAIR) 10 MG tablet Take 10 mg by mouth daily.     zinc gluconate 50 MG tablet Take 50 mg by mouth daily.     cetirizine (ZYRTEC) 10 MG tablet Take 10 mg by mouth daily. (Patient not  taking: Reported on 04/19/2022)     No current facility-administered medications for this visit.    SURGICAL HISTORY:  Past Surgical History:  Procedure Laterality Date   ABDOMINAL HYSTERECTOMY  partial    LAPAROSCOPIC ENDOMETRIOSIS FULGURATION     SHOULDER SURGERY  2015   right     REVIEW OF SYSTEMS:  A comprehensive review of systems was negative.   PHYSICAL EXAMINATION: General appearance: alert, cooperative, and no distress Head: Normocephalic, without obvious abnormality, atraumatic Neck: no adenopathy Lymph nodes: Cervical, supraclavicular, and axillary nodes normal. Resp: clear to auscultation bilaterally Back: symmetric, no curvature. ROM normal. No CVA tenderness. Cardio: regular rate and rhythm, S1, S2 normal, no murmur, click, rub or gallop GI: soft, non-tender; bowel sounds normal; no masses,  no organomegaly Extremities: extremities normal, atraumatic, no cyanosis or edema  ECOG PERFORMANCE STATUS: 0 - Asymptomatic  Blood pressure 110/64, pulse 76, temperature (!) 97.2 F (36.2 C), temperature source Tympanic, resp. rate 18, weight 121 lb 9 oz (55.1 kg), SpO2 97 %.  LABORATORY DATA: Lab Results  Component Value Date   WBC 4.2 04/17/2022   HGB 14.1 04/17/2022   HCT 41.5 04/17/2022   MCV 94.3 04/17/2022   PLT 174 04/17/2022      Chemistry      Component Value Date/Time   NA 142 04/17/2022 1105   NA 143 06/03/2018 1639   NA 140 07/31/2017 1436   K 4.2 04/17/2022  1105   K 4.2 07/31/2017 1436   CL 106 04/17/2022 1105   CL 103 08/15/2012 1600   CO2 31 04/17/2022 1105   CO2 29 07/31/2017 1436   BUN 8 04/17/2022 1105   BUN 11 06/03/2018 1639   BUN 9.7 07/31/2017 1436   CREATININE 0.58 04/17/2022 1105   CREATININE 0.8 07/31/2017 1436      Component Value Date/Time   CALCIUM 9.4 04/17/2022 1105   CALCIUM 9.5 07/31/2017 1436   ALKPHOS 51 04/17/2022 1105   ALKPHOS 45 07/31/2017 1436   AST 18 04/17/2022 1105   AST 20 07/31/2017 1436   ALT 16 04/17/2022  1105   ALT 24 07/31/2017 1436   BILITOT 0.7 04/17/2022 1105   BILITOT 0.60 07/31/2017 1436     Ferritin 42, serum iron 152, total iron binding capacity 311, iron saturation 49%.  RADIOGRAPHIC STUDIES: No results found.  ASSESSMENT AND PLAN:  This is a very pleasant 57 years old white female with hereditary hemochromatosis with homozygous C282Y mutation.  The patient is currently on observation and she is feeling fine.  She did not require any phlebotomy recently. She had repeat CBC, iron study and ferritin that were unremarkable except for the elevated iron saturation. Her ferritin level is 42. I recommended for the patient to continue on observation and close monitoring for now. I will see her back for follow-up visit in 9 months for evaluation and repeat blood work. She was advised to call immediately if she has any other concerning symptoms in the interval. All questions were answered. The patient knows to call the clinic with any problems, questions or concerns. We can certainly see the patient much sooner if necessary.  Disclaimer: This note was dictated with voice recognition software. Similar sounding words can inadvertently be transcribed and may be missed upon review.

## 2023-12-17 ENCOUNTER — Inpatient Hospital Stay: Payer: BC Managed Care – PPO | Attending: Internal Medicine

## 2023-12-17 LAB — CBC WITH DIFFERENTIAL (CANCER CENTER ONLY)
Abs Immature Granulocytes: 0 10*3/uL (ref 0.00–0.07)
Basophils Absolute: 0 10*3/uL (ref 0.0–0.1)
Basophils Relative: 1 %
Eosinophils Absolute: 0.1 10*3/uL (ref 0.0–0.5)
Eosinophils Relative: 2 %
HCT: 41.1 % (ref 36.0–46.0)
Hemoglobin: 14 g/dL (ref 12.0–15.0)
Immature Granulocytes: 0 %
Lymphocytes Relative: 40 %
Lymphs Abs: 1.6 10*3/uL (ref 0.7–4.0)
MCH: 31.6 pg (ref 26.0–34.0)
MCHC: 34.1 g/dL (ref 30.0–36.0)
MCV: 92.8 fL (ref 80.0–100.0)
Monocytes Absolute: 0.3 10*3/uL (ref 0.1–1.0)
Monocytes Relative: 7 %
Neutro Abs: 2 10*3/uL (ref 1.7–7.7)
Neutrophils Relative %: 50 %
Platelet Count: 174 10*3/uL (ref 150–400)
RBC: 4.43 MIL/uL (ref 3.87–5.11)
RDW: 12.1 % (ref 11.5–15.5)
WBC Count: 4 10*3/uL (ref 4.0–10.5)
nRBC: 0 % (ref 0.0–0.2)

## 2023-12-17 LAB — IRON AND IRON BINDING CAPACITY (CC-WL,HP ONLY)
Iron: 179 ug/dL — ABNORMAL HIGH (ref 28–170)
Saturation Ratios: 54 % — ABNORMAL HIGH (ref 10.4–31.8)
TIBC: 329 ug/dL (ref 250–450)
UIBC: 150 ug/dL (ref 148–442)

## 2023-12-17 LAB — FERRITIN: Ferritin: 44 ng/mL (ref 11–307)

## 2023-12-20 ENCOUNTER — Other Ambulatory Visit: Payer: Self-pay

## 2023-12-20 ENCOUNTER — Inpatient Hospital Stay: Payer: BC Managed Care – PPO | Admitting: Internal Medicine

## 2023-12-20 NOTE — Progress Notes (Signed)
[facility] Telephone:(336) [phone]   Fax:(336) [phone]  OFFICE PROGRESS NOTE  [name], PA 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   PRIOR THERAPY: Weekly phlebotomies x3, last one was in January of 2014.   CURRENT THERAPY: Phlebotomy on as-needed basis.  INTERVAL HISTORY: Ruth Harrison 58 y.o. female returns to the clinic today for follow-up visit.Discussed the use of AI scribe software for clinical note transcription with the patient, who gave verbal consent to proceed.  History of Present Illness   The patient, a 58 year old diagnosed with hereditary hemochromatosis (homozygous C282Y mutation), has been managed with intermittent phlebotomies to maintain a ferritin level of 50 or less. She reports feeling generally the same since the last visit, with persistent fatigue and difficulty waking up in the morning. She suspects this could be due to low Vitamin D levels and has been taking a liquid supplement and a multivitamin, prescribed by a dietician. The patient has been losing weight and is due for surgery later in the year, hence the dietician's recommendation to gain weight and increase vitamin intake. She has been careful to avoid iron in her supplements due to her hemochromatosis. The patient's ferritin level is currently 44, and she has not required a phlebotomy for several years. She also mentions a high saturation level but is reassured that the primary parameter of concern is the ferritin level. The patient's abdominal symptoms remain unchanged.        MEDICAL HISTORY: Past Medical History:  Diagnosis Date   Allergy    SEASONAL   Chronic neck pain    Colon polyps 2010   Endometriosis    Headache    Hemochromatosis, hereditary (HCC)    IBS (irritable bowel syndrome)    Urinary tract infection     ALLERGIES:  is allergic to oxycodone, penicillins, prednisone,  sulfa antibiotics, voltaren [diclofenac], codeine, doxycycline, valacyclovir hcl, meloxicam, and wheat.  MEDICATIONS:  Current Outpatient Medications  Medication Sig Dispense Refill   Ascorbic Acid (VITAMIN C) 100 MG tablet Take 250 mg by mouth daily.     cetirizine (ZYRTEC) 10 MG tablet Take 10 mg by mouth daily. (Patient not taking: Reported on 04/19/2022)     cholecalciferol (VITAMIN D3) 25 MCG (1000 UNIT) tablet Take 2,000 Units by mouth daily.     DIGESTIVE ENZYMES PO Take 500 mg by mouth daily as needed (digestion).      montelukast (SINGULAIR) 10 MG tablet Take 10 mg by mouth daily.     zinc gluconate 50 MG tablet Take 50 mg by mouth daily.     No current facility-administered medications for this visit.    SURGICAL HISTORY:  Past Surgical History:  Procedure Laterality Date   ABDOMINAL HYSTERECTOMY  partial    LAPAROSCOPIC ENDOMETRIOSIS FULGURATION     SHOULDER SURGERY  2015   right     REVIEW OF SYSTEMS:  A comprehensive review of systems was negative except for: Constitutional: positive for fatigue   PHYSICAL EXAMINATION: General appearance: alert, cooperative, and no distress Head: Normocephalic, without obvious abnormality, atraumatic Neck: no adenopathy Lymph nodes: Cervical, supraclavicular, and axillary nodes normal. Resp: clear to auscultation bilaterally Back: symmetric, no curvature. ROM normal. No CVA tenderness. Cardio: regular rate and rhythm, S1, S2 normal, no murmur, click, rub or gallop GI: soft, non-tender; bowel sounds normal; no masses,  no organomegaly Extremities: extremities normal, atraumatic, no cyanosis or edema  ECOG PERFORMANCE STATUS:  0 - Asymptomatic  Blood pressure 95/67, pulse 63, temperature 98.6 F (37 C), temperature source Temporal, resp. rate 12, weight 122 lb 14.4 oz (55.7 kg), SpO2 98%.  LABORATORY DATA: Lab Results  Component Value Date   WBC 4.0 12/17/2023   HGB 14.0 12/17/2023   HCT 41.1 12/17/2023   MCV 92.8 12/17/2023    PLT 174 12/17/2023      Chemistry      Component Value Date/Time   NA 142 04/17/2022 1105   NA 143 06/03/2018 1639   NA 140 07/31/2017 1436   K 4.2 04/17/2022 1105   K 4.2 07/31/2017 1436   CL 106 04/17/2022 1105   CL 103 08/15/2012 1600   CO2 31 04/17/2022 1105   CO2 29 07/31/2017 1436   BUN 8 04/17/2022 1105   BUN 11 06/03/2018 1639   BUN 9.7 07/31/2017 1436   CREATININE 0.58 04/17/2022 1105   CREATININE 0.8 07/31/2017 1436      Component Value Date/Time   CALCIUM 9.4 04/17/2022 1105   CALCIUM 9.5 07/31/2017 1436   ALKPHOS 51 04/17/2022 1105   ALKPHOS 45 07/31/2017 1436   AST 18 04/17/2022 1105   AST 20 07/31/2017 1436   ALT 16 04/17/2022 1105   ALT 24 07/31/2017 1436   BILITOT 0.7 04/17/2022 1105   BILITOT 0.60 07/31/2017 1436     Ferritin 44, serum iron 179, total iron binding capacity 329, iron saturation 54%.  RADIOGRAPHIC STUDIES: No results found.  ASSESSMENT AND PLAN:  This is a very pleasant 58 years old white female with hereditary hemochromatosis with homozygous C282Y mutation.  The patient is currently on observation and she is feeling fine.  She did not require any phlebotomy recently.    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 Ferritin levels are well-controlled at 44. No phlebotomy required. Reports fatigue, possibly due to low vitamin D. Iron saturation is high, but ferritin is the primary monitoring parameter as it indicates storage iron, which can deposit in the liver and pancreas. Advised to maintain ferritin at 50 or less. - Repeat blood work in six months - Schedule lab tests a day or two before the next visit - Ensure vitamin supplements do not contain iron  Vitamin D Deficiency Low vitamin D levels. Currently taking a liquid supplement and a multivitamin prescribed by a dietician. Advised to avoid supplements with high iron content. - Continue prescribed vitamin D supplement and multivitamin -  Ensure supplements do not contain iron  Follow-up - Schedule follow-up appointment in six months - Perform blood work a day or two before the follow-up visit.   The patient was advised to call immediately if she has any other concerning symptoms in the interval. All questions were answered. The patient knows to call the clinic with any problems, questions or concerns. We can certainly see the patient much sooner if necessary.  Disclaimer: This note was dictated with voice recognition software. Similar sounding words can inadvertently be transcribed and may be missed upon review.

## 2024-06-16 ENCOUNTER — Inpatient Hospital Stay: Payer: BC Managed Care – PPO | Attending: Internal Medicine

## 2024-06-16 DIAGNOSIS — R7989 Other specified abnormal findings of blood chemistry: Secondary | ICD-10-CM | POA: Insufficient documentation

## 2024-06-16 LAB — CBC WITH DIFFERENTIAL (CANCER CENTER ONLY)
Abs Immature Granulocytes: 0.01 K/uL (ref 0.00–0.07)
Basophils Absolute: 0 K/uL (ref 0.0–0.1)
Basophils Relative: 0 %
Eosinophils Absolute: 0.1 K/uL (ref 0.0–0.5)
Eosinophils Relative: 1 %
HCT: 39.4 % (ref 36.0–46.0)
Hemoglobin: 13.3 g/dL (ref 12.0–15.0)
Immature Granulocytes: 0 %
Lymphocytes Relative: 36 %
Lymphs Abs: 2 K/uL (ref 0.7–4.0)
MCH: 31.9 pg (ref 26.0–34.0)
MCHC: 33.8 g/dL (ref 30.0–36.0)
MCV: 94.5 fL (ref 80.0–100.0)
Monocytes Absolute: 0.5 K/uL (ref 0.1–1.0)
Monocytes Relative: 8 %
Neutro Abs: 3 K/uL (ref 1.7–7.7)
Neutrophils Relative %: 55 %
Platelet Count: 177 K/uL (ref 150–400)
RBC: 4.17 MIL/uL (ref 3.87–5.11)
RDW: 12.3 % (ref 11.5–15.5)
WBC Count: 5.6 K/uL (ref 4.0–10.5)
nRBC: 0 % (ref 0.0–0.2)

## 2024-06-16 LAB — IRON AND IRON BINDING CAPACITY (CC-WL,HP ONLY)
Iron: 120 ug/dL (ref 28–170)
Saturation Ratios: 37 % — ABNORMAL HIGH (ref 10.4–31.8)
TIBC: 325 ug/dL (ref 250–450)
UIBC: 205 ug/dL (ref 148–442)

## 2024-06-17 LAB — FERRITIN: Ferritin: 60 ng/mL (ref 11–307)

## 2024-06-18 ENCOUNTER — Inpatient Hospital Stay: Payer: BC Managed Care – PPO

## 2024-06-18 ENCOUNTER — Inpatient Hospital Stay (HOSPITAL_BASED_OUTPATIENT_CLINIC_OR_DEPARTMENT_OTHER): Payer: BC Managed Care – PPO | Admitting: Internal Medicine

## 2024-06-18 NOTE — Progress Notes (Signed)
 Ruth Harrison presents today for phlebotomy per MD orders. Phlebotomy procedure started at 1646 and ended at 1723. 510 grams removed. 20g in left forearm used.  Patient observed for 30 minutes after procedure without any incident. Patient tolerated procedure well. IV needle removed intact.

## 2024-06-18 NOTE — Patient Instructions (Signed)

## 2024-06-18 NOTE — Progress Notes (Signed)
 [facility] Telephone:(336) [phone]   Fax:(336) [phone]  OFFICE PROGRESS NOTE  Juliane Che, PA [address] [place] [address]  DIAGNOSIS: Hereditary hemochromatosis with homozygous C282Y mutation.   PRIOR THERAPY: Weekly phlebotomies x3, last one was in January of 2014.   CURRENT THERAPY: Phlebotomy on as-needed basis.  INTERVAL HISTORY: Ruth Harrison 58 y.o. female returns to the clinic today for follow-up visit.Discussed the use of AI scribe software for clinical note transcription with the patient, who gave verbal consent to proceed.  History of Present Illness Ruth Harrison is a 58 year old female with hereditary hemochromatosis who presents for evaluation and repeat blood work.  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  Recently, she has experienced increased fatigue and joint discomfort, particularly in her fingers, which she attributes to being busy at work.  Her recent laboratory results show hemoglobin and iron levels within normal limits, but her ferritin level is slightly elevated at 60, compared to her usual target of 50. Her iron saturation has improved to 37% from 54% in January, and her serum iron level has decreased to 120 from 179 in January.  No other significant changes in symptoms are reported.  In the review of systems, she reports feeling 'really tired' and experiencing some joint discomfort in her fingers. No other significant changes in her symptoms.     MEDICAL HISTORY: Past Medical History:  Diagnosis Date   Allergy    SEASONAL   Chronic neck pain    Colon polyps 2010   Endometriosis    Headache    Hemochromatosis, hereditary (HCC)    IBS (irritable bowel syndrome)    Urinary tract infection     ALLERGIES:  is allergic to oxycodone, penicillins, prednisone, sulfa antibiotics, voltaren  [diclofenac], codeine, doxycycline , valacyclovir hcl, meloxicam , and wheat.  MEDICATIONS:  Current Outpatient Medications  Medication Sig Dispense Refill   Ascorbic Acid (VITAMIN C) 100 MG tablet Take 250 mg by mouth daily.     cetirizine (ZYRTEC) 10 MG tablet Take 10 mg by mouth daily. (Patient not taking: Reported on 04/19/2022)     cholecalciferol (VITAMIN D3) 25 MCG (1000 UNIT) tablet Take 2,000 Units by mouth daily.     DIGESTIVE ENZYMES PO Take 500 mg by mouth daily as needed (digestion).      montelukast  (SINGULAIR ) 10 MG tablet Take 10 mg by mouth daily.     zinc gluconate 50 MG tablet Take 50 mg by mouth daily.     No current facility-administered medications for this visit.    SURGICAL HISTORY:  Past Surgical History:  Procedure Laterality Date   ABDOMINAL HYSTERECTOMY  partial    LAPAROSCOPIC ENDOMETRIOSIS FULGURATION     SHOULDER SURGERY  2015   right     REVIEW OF SYSTEMS:  A comprehensive review of systems was negative except for: Constitutional: positive for fatigue   PHYSICAL EXAMINATION: General appearance: alert, cooperative, fatigued, and no distress Head: Normocephalic, without obvious abnormality, atraumatic Neck: no adenopathy, no JVD, supple, symmetrical, trachea midline, and thyroid  not enlarged, symmetric, no tenderness/mass/nodules Lymph nodes: Cervical, supraclavicular, and axillary nodes normal. Resp: clear to auscultation bilaterally Back: symmetric, no curvature. ROM normal. No CVA tenderness. Cardio: regular rate and rhythm, S1, S2 normal, no murmur, click, rub or gallop GI: soft, non-tender; bowel sounds normal; no masses,  no organomegaly Extremities: extremities normal, atraumatic, no  cyanosis or edema  ECOG PERFORMANCE STATUS: 0 - Asymptomatic  Blood pressure 105/65, pulse 75, temperature 97.6 F (36.4 C), temperature source Temporal, resp. rate 17, height 5' 1 (1.549 m), weight 124 lb (56.2 kg), SpO2 99%.  LABORATORY DATA: Lab Results   Component Value Date   WBC 5.6 06/16/2024   HGB 13.3 06/16/2024   HCT 39.4 06/16/2024   MCV 94.5 06/16/2024   PLT 177 06/16/2024      Chemistry      Component Value Date/Time   NA 142 04/17/2022 1105   NA 143 06/03/2018 1639   NA 140 07/31/2017 1436   K 4.2 04/17/2022 1105   K 4.2 07/31/2017 1436   CL 106 04/17/2022 1105   CL 103 08/15/2012 1600   CO2 31 04/17/2022 1105   CO2 29 07/31/2017 1436   BUN 8 04/17/2022 1105   BUN 11 06/03/2018 1639   BUN 9.7 07/31/2017 1436   CREATININE 0.58 04/17/2022 1105   CREATININE 0.8 07/31/2017 1436      Component Value Date/Time   CALCIUM 9.4 04/17/2022 1105   CALCIUM 9.5 07/31/2017 1436   ALKPHOS 51 04/17/2022 1105   ALKPHOS 45 07/31/2017 1436   AST 18 04/17/2022 1105   AST 20 07/31/2017 1436   ALT 16 04/17/2022 1105   ALT 24 07/31/2017 1436   BILITOT 0.7 04/17/2022 1105   BILITOT 0.60 07/31/2017 1436     Ferritin 60, serum iron 120, total iron binding capacity 325, iron saturation 37%.  RADIOGRAPHIC STUDIES: No results found.  ASSESSMENT AND PLAN:  This is a very pleasant 58 years old white female with hereditary hemochromatosis with homozygous C282Y mutation.  The patient is currently on observation and she is feeling fine.  Blood work today showed ferritin level of 60.  I recommended for the patient to proceed with phlebotomy today to keep it 50 or less. Assessment and Plan Assessment & Plan Hereditary hemochromatosis with hemizygous C2A2Y mutation Post phlebotomy on an as-needed basis since January 2014. Ferritin level is slightly elevated at 60, with a target of 50. Iron saturation has improved to 37% from 54% in January. Serum iron is 120, decreased from 179 in January. Hemoglobin levels are well-managed, indicating no anemia. Fatigue is present but not due to anemia. The goal is to prevent iron deposition in the liver and other organs. - Perform phlebotomy today to reduce ferritin levels. - Schedule follow-up in six  months. - Continue phlebotomy on an as-needed basis to maintain ferritin around 50. She was advised to call immediately if she has any other concerning symptoms in the interval. All questions were answered. The patient knows to call the clinic with any problems, questions or concerns. We can certainly see the patient much sooner if necessary.  Disclaimer: This note was dictated with voice recognition software. Similar sounding words can inadvertently be transcribed and may be missed upon review.

## 2024-06-23 ENCOUNTER — Telehealth: Payer: Self-pay | Admitting: Medical Oncology

## 2024-06-23 NOTE — Telephone Encounter (Signed)
 She napped on Sunday and she never take naps. Today , she reported she is finally feeling better and her left arm is only bruised. I told her to call back for any redness, warmth , swelling to left arm.

## 2024-06-23 NOTE — Telephone Encounter (Signed)
 IV episode. -She was stuck 3 times for her phlebotomy on 06/18/2024 . After the procedure experienced fatigue,  weakness, light headed and her left arm was sore.

## 2024-12-22 ENCOUNTER — Encounter: Payer: Self-pay | Admitting: Internal Medicine

## 2024-12-22 ENCOUNTER — Inpatient Hospital Stay: Attending: Internal Medicine

## 2024-12-22 DIAGNOSIS — K589 Irritable bowel syndrome without diarrhea: Secondary | ICD-10-CM | POA: Insufficient documentation

## 2024-12-22 DIAGNOSIS — R5383 Other fatigue: Secondary | ICD-10-CM | POA: Diagnosis not present

## 2024-12-22 DIAGNOSIS — Z79899 Other long term (current) drug therapy: Secondary | ICD-10-CM | POA: Insufficient documentation

## 2024-12-22 DIAGNOSIS — Z87440 Personal history of urinary (tract) infections: Secondary | ICD-10-CM | POA: Diagnosis not present

## 2024-12-22 DIAGNOSIS — Z860100 Personal history of colon polyps, unspecified: Secondary | ICD-10-CM | POA: Diagnosis not present

## 2024-12-22 LAB — CMP (CANCER CENTER ONLY)
ALT: 17 U/L (ref 0–44)
AST: 23 U/L (ref 15–41)
Albumin: 4.5 g/dL (ref 3.5–5.0)
Alkaline Phosphatase: 64 U/L (ref 38–126)
Anion gap: 10 (ref 5–15)
BUN: 18 mg/dL (ref 6–20)
CO2: 25 mmol/L (ref 22–32)
Calcium: 9.4 mg/dL (ref 8.9–10.3)
Chloride: 106 mmol/L (ref 98–111)
Creatinine: 0.6 mg/dL (ref 0.44–1.00)
GFR, Estimated: >60 mL/min
Glucose, Bld: 91 mg/dL (ref 70–99)
Potassium: 4.5 mmol/L (ref 3.5–5.1)
Sodium: 141 mmol/L (ref 135–145)
Total Bilirubin: 0.7 mg/dL (ref 0.0–1.2)
Total Protein: 7.4 g/dL (ref 6.5–8.1)

## 2024-12-22 LAB — CBC WITH DIFFERENTIAL (CANCER CENTER ONLY)
Abs Immature Granulocytes: 0 K/uL (ref 0.00–0.07)
Basophils Absolute: 0 K/uL (ref 0.0–0.1)
Basophils Relative: 1 %
Eosinophils Absolute: 0 K/uL (ref 0.0–0.5)
Eosinophils Relative: 1 %
HCT: 39 % (ref 36.0–46.0)
Hemoglobin: 13 g/dL (ref 12.0–15.0)
Immature Granulocytes: 0 %
Lymphocytes Relative: 45 %
Lymphs Abs: 1.6 K/uL (ref 0.7–4.0)
MCH: 30.9 pg (ref 26.0–34.0)
MCHC: 33.3 g/dL (ref 30.0–36.0)
MCV: 92.6 fL (ref 80.0–100.0)
Monocytes Absolute: 0.3 K/uL (ref 0.1–1.0)
Monocytes Relative: 9 %
Neutro Abs: 1.6 K/uL — ABNORMAL LOW (ref 1.7–7.7)
Neutrophils Relative %: 44 %
Platelet Count: 189 K/uL (ref 150–400)
RBC: 4.21 MIL/uL (ref 3.87–5.11)
RDW: 13.2 % (ref 11.5–15.5)
WBC Count: 3.6 K/uL — ABNORMAL LOW (ref 4.0–10.5)
nRBC: 0 % (ref 0.0–0.2)

## 2024-12-22 LAB — IRON AND IRON BINDING CAPACITY (CC-WL,HP ONLY)
Iron: 167 ug/dL (ref 28–170)
Saturation Ratios: 53 % — ABNORMAL HIGH (ref 10.4–31.8)
TIBC: 316 ug/dL (ref 250–450)
UIBC: 149 ug/dL

## 2024-12-22 LAB — FERRITIN: Ferritin: 31 ng/mL (ref 11–307)

## 2024-12-24 ENCOUNTER — Inpatient Hospital Stay: Admitting: Internal Medicine

## 2024-12-24 ENCOUNTER — Inpatient Hospital Stay

## 2024-12-24 NOTE — Progress Notes (Signed)
 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   PRIOR THERAPY: Weekly phlebotomies x3, last one was in January of 2014.   CURRENT THERAPY: Phlebotomy on as-needed basis.  INTERVAL HISTORY: Ruth Harrison 59 y.o. female returns to the clinic today for follow-up visit.Discussed the use of AI scribe software for clinical note transcription with the patient, who gave verbal consent to proceed.  History of Present Illness Ruth Harrison is a 59 year old female with hereditary hemochromatosis due to homozygous C282Y mutation who presents for evaluation and repeat laboratory studies.  Recent laboratory studies show a ferritin level of 31. Although iron saturation and iron levels are elevated, the patient did not report new symptoms. She denies chest pain, dyspnea, or other systemic complaints related to hemochromatosis.  Since her last visit in July, she underwent major jaw surgery and is recovering well, maintaining her weight and reporting satisfactory progress. She endorses significant fatigue, which she attributes to increased physical activity and assisting family members with moving, rather than to her underlying hematologic condition.  She occasionally experiences mild discomfort in her hands and wrists, but these symptoms are not interfering with daily activities. No other complaints or new issues were discussed during the visit.    MEDICAL HISTORY: Past Medical History:  Diagnosis Date   Allergy    SEASONAL   Chronic neck pain    Colon polyps 2010   Endometriosis    Headache    Hemochromatosis, hereditary    IBS (irritable bowel syndrome)    Urinary tract infection     ALLERGIES:  is allergic to oxycodone, penicillins, prednisone, sulfa antibiotics, voltaren [diclofenac], codeine,  doxycycline , valacyclovir hcl, meloxicam , and wheat.  MEDICATIONS:  Current Outpatient Medications  Medication Sig Dispense Refill   Ascorbic Acid (VITAMIN C) 100 MG tablet Take 250 mg by mouth daily.     cetirizine (ZYRTEC) 10 MG tablet Take 10 mg by mouth daily. (Patient not taking: Reported on 04/19/2022)     cholecalciferol (VITAMIN D3) 25 MCG (1000 UNIT) tablet Take 2,000 Units by mouth daily.     DIGESTIVE ENZYMES PO Take 500 mg by mouth daily as needed (digestion).      montelukast  (SINGULAIR ) 10 MG tablet Take 10 mg by mouth daily.     zinc gluconate 50 MG tablet Take 50 mg by mouth daily.     No current facility-administered medications for this visit.    SURGICAL HISTORY:  Past Surgical History:  Procedure Laterality Date   ABDOMINAL HYSTERECTOMY  partial    LAPAROSCOPIC ENDOMETRIOSIS FULGURATION     SHOULDER SURGERY  2015   right     REVIEW OF SYSTEMS:  A comprehensive review of systems was negative.   PHYSICAL EXAMINATION: General appearance: alert, cooperative, and no distress Head: Normocephalic, without obvious abnormality, atraumatic Neck: no adenopathy, no JVD, supple, symmetrical, trachea midline, and thyroid  not enlarged, symmetric, no tenderness/mass/nodules Lymph nodes: Cervical, supraclavicular, and axillary nodes normal. Resp: clear to auscultation bilaterally Back: symmetric, no curvature. ROM normal. No CVA tenderness. Cardio: regular rate and rhythm, S1, S2 normal, no murmur, click, rub or gallop GI: soft, non-tender; bowel sounds normal; no masses,  no organomegaly Extremities: extremities normal, atraumatic, no cyanosis or edema  ECOG PERFORMANCE STATUS: 0 - Asymptomatic  Blood pressure 106/62, pulse 71, temperature 97.9 F (36.6 C), temperature source Temporal,  height 5' 1 (1.549 m), weight 124 lb 11.2 oz (56.6 kg), SpO2 98%.  LABORATORY DATA: Lab Results  Component Value Date   WBC 3.6 (L) 12/22/2024   HGB 13.0 12/22/2024   HCT 39.0  12/22/2024   MCV 92.6 12/22/2024   PLT 189 12/22/2024      Chemistry      Component Value Date/Time   NA 141 12/22/2024 1115   NA 143 06/03/2018 1639   NA 140 07/31/2017 1436   K 4.5 12/22/2024 1115   K 4.2 07/31/2017 1436   CL 106 12/22/2024 1115   CL 103 08/15/2012 1600   CO2 25 12/22/2024 1115   CO2 29 07/31/2017 1436   BUN 18 12/22/2024 1115   BUN 11 06/03/2018 1639   BUN 9.7 07/31/2017 1436   CREATININE 0.60 12/22/2024 1115   CREATININE 0.8 07/31/2017 1436      Component Value Date/Time   CALCIUM 9.4 12/22/2024 1115   CALCIUM 9.5 07/31/2017 1436   ALKPHOS 64 12/22/2024 1115   ALKPHOS 45 07/31/2017 1436   AST 23 12/22/2024 1115   AST 20 07/31/2017 1436   ALT 17 12/22/2024 1115   ALT 24 07/31/2017 1436   BILITOT 0.7 12/22/2024 1115   BILITOT 0.60 07/31/2017 1436     Ferritin 31, serum iron 167, total iron binding capacity 316, iron saturation 53%.  RADIOGRAPHIC STUDIES: No results found.  ASSESSMENT AND PLAN:  This is a very pleasant 59 years old white female with hereditary hemochromatosis with homozygous C282Y mutation.  The patient is currently on observation and she is feeling fine.  Serum ferritin performed on December 22, 2024 was 31. Assessment and Plan Assessment & Plan Hereditary hemochromatosis Homozygous C282Y hereditary hemochromatosis. Ferritin is 31, within target range and not indicative of iron overload. Iron saturation and iron levels are elevated, but ferritin remains the primary marker for management. - Reviewed recent laboratory results, including ferritin, iron saturation, and iron level. - Provided reassurance regarding current ferritin and iron status. - No intervention required as ferritin is below threshold of concern. - Scheduled follow-up in six months. The patient was advised to call immediately if she has any other concerning symptoms in the interval.  All questions were answered. The patient knows to call the clinic with any  problems, questions or concerns. We can certainly see the patient much sooner if necessary.  Disclaimer: This note was dictated with voice recognition software. Similar sounding words can inadvertently be transcribed and may be missed upon review.       "

## 2025-06-22 ENCOUNTER — Inpatient Hospital Stay

## 2025-06-24 ENCOUNTER — Inpatient Hospital Stay: Admitting: Internal Medicine
